# Patient Record
Sex: Female | Born: 1950 | Race: White | Hispanic: No | Marital: Married | Smoking: Former smoker
Health system: Southern US, Community
[De-identification: ages and names within clinical notes are randomized; demographics above are authoritative.]

## PROBLEM LIST (undated history)

## (undated) DIAGNOSIS — K5792 Diverticulitis of intestine, part unspecified, without perforation or abscess without bleeding: Secondary | ICD-10-CM

## (undated) DIAGNOSIS — E039 Hypothyroidism, unspecified: Secondary | ICD-10-CM

## (undated) DIAGNOSIS — G43909 Migraine, unspecified, not intractable, without status migrainosus: Secondary | ICD-10-CM

## (undated) DIAGNOSIS — F329 Major depressive disorder, single episode, unspecified: Secondary | ICD-10-CM

## (undated) DIAGNOSIS — I4719 Other supraventricular tachycardia: Secondary | ICD-10-CM

## (undated) DIAGNOSIS — J45909 Unspecified asthma, uncomplicated: Secondary | ICD-10-CM

## (undated) DIAGNOSIS — M4800 Spinal stenosis, site unspecified: Secondary | ICD-10-CM

## (undated) DIAGNOSIS — K219 Gastro-esophageal reflux disease without esophagitis: Secondary | ICD-10-CM

## (undated) DIAGNOSIS — H5213 Myopia, bilateral: Secondary | ICD-10-CM

## (undated) DIAGNOSIS — N200 Calculus of kidney: Secondary | ICD-10-CM

## (undated) DIAGNOSIS — I10 Essential (primary) hypertension: Secondary | ICD-10-CM

## (undated) DIAGNOSIS — H409 Unspecified glaucoma: Secondary | ICD-10-CM

## (undated) DIAGNOSIS — M1990 Unspecified osteoarthritis, unspecified site: Secondary | ICD-10-CM

## (undated) DIAGNOSIS — F32A Depression, unspecified: Secondary | ICD-10-CM

## (undated) HISTORY — DX: Unspecified asthma, uncomplicated: J45.909

## (undated) HISTORY — DX: Diverticulitis of intestine, part unspecified, without perforation or abscess without bleeding: K57.92

## (undated) HISTORY — DX: Unspecified osteoarthritis, unspecified site: M19.90

## (undated) HISTORY — DX: Unspecified glaucoma: H40.9

## (undated) HISTORY — DX: Myopia, bilateral: H52.13

## (undated) HISTORY — DX: Hypothyroidism, unspecified: E03.9

## (undated) HISTORY — DX: Major depressive disorder, single episode, unspecified: F32.9

## (undated) HISTORY — DX: Essential (primary) hypertension: I10

## (undated) HISTORY — DX: Gastro-esophageal reflux disease without esophagitis: K21.9

## (undated) HISTORY — DX: Migraine, unspecified, not intractable, without status migrainosus: G43.909

## (undated) HISTORY — DX: Calculus of kidney: N20.0

## (undated) HISTORY — DX: Depression, unspecified: F32.A

## (undated) HISTORY — PX: EYE SURGERY: SHX253

---

## 1978-02-07 HISTORY — PX: OTHER SURGICAL HISTORY: SHX169

## 2003-02-08 HISTORY — PX: OTHER SURGICAL HISTORY: SHX169

## 2004-01-31 ENCOUNTER — Other Ambulatory Visit: Payer: Self-pay

## 2004-02-01 ENCOUNTER — Inpatient Hospital Stay: Payer: Self-pay | Admitting: Podiatry

## 2004-02-08 HISTORY — PX: FOOT SURGERY: SHX648

## 2004-02-10 ENCOUNTER — Inpatient Hospital Stay: Payer: Self-pay | Admitting: Podiatry

## 2004-09-15 ENCOUNTER — Ambulatory Visit: Payer: Self-pay | Admitting: Internal Medicine

## 2005-05-28 ENCOUNTER — Ambulatory Visit: Payer: Self-pay

## 2005-10-12 ENCOUNTER — Other Ambulatory Visit: Payer: Self-pay

## 2005-10-12 ENCOUNTER — Inpatient Hospital Stay: Payer: Self-pay | Admitting: Internal Medicine

## 2005-11-10 ENCOUNTER — Ambulatory Visit: Payer: Self-pay | Admitting: Internal Medicine

## 2005-11-29 ENCOUNTER — Ambulatory Visit (HOSPITAL_BASED_OUTPATIENT_CLINIC_OR_DEPARTMENT_OTHER): Admission: RE | Admit: 2005-11-29 | Discharge: 2005-11-29 | Payer: Self-pay | Admitting: Orthopedic Surgery

## 2006-02-07 HISTORY — PX: SHOULDER ADHESION RELEASE: SHX773

## 2006-06-07 ENCOUNTER — Ambulatory Visit: Payer: Self-pay | Admitting: Internal Medicine

## 2006-09-29 ENCOUNTER — Ambulatory Visit: Payer: Self-pay | Admitting: Unknown Physician Specialty

## 2007-01-10 ENCOUNTER — Ambulatory Visit: Payer: Self-pay | Admitting: Internal Medicine

## 2008-01-15 ENCOUNTER — Ambulatory Visit: Payer: Self-pay | Admitting: Internal Medicine

## 2009-01-09 ENCOUNTER — Ambulatory Visit: Payer: Self-pay | Admitting: Ophthalmology

## 2009-02-10 ENCOUNTER — Ambulatory Visit: Payer: Self-pay | Admitting: Ophthalmology

## 2009-03-11 ENCOUNTER — Ambulatory Visit: Payer: Self-pay | Admitting: Ophthalmology

## 2009-04-22 ENCOUNTER — Ambulatory Visit: Payer: Self-pay | Admitting: Ophthalmology

## 2010-01-22 ENCOUNTER — Ambulatory Visit: Payer: Self-pay | Admitting: Internal Medicine

## 2010-04-16 ENCOUNTER — Ambulatory Visit: Payer: Self-pay | Admitting: Unknown Physician Specialty

## 2011-03-23 ENCOUNTER — Ambulatory Visit: Payer: Self-pay | Admitting: Internal Medicine

## 2011-12-05 ENCOUNTER — Encounter: Payer: Self-pay | Admitting: Internal Medicine

## 2011-12-05 ENCOUNTER — Ambulatory Visit (INDEPENDENT_AMBULATORY_CARE_PROVIDER_SITE_OTHER): Payer: PRIVATE HEALTH INSURANCE | Admitting: Internal Medicine

## 2011-12-05 VITALS — BP 108/69 | HR 62 | Temp 98.0°F | Ht 66.5 in | Wt 161.2 lb

## 2011-12-05 DIAGNOSIS — M79609 Pain in unspecified limb: Secondary | ICD-10-CM

## 2011-12-05 DIAGNOSIS — M25559 Pain in unspecified hip: Secondary | ICD-10-CM

## 2011-12-05 DIAGNOSIS — M79606 Pain in leg, unspecified: Secondary | ICD-10-CM

## 2011-12-05 DIAGNOSIS — K219 Gastro-esophageal reflux disease without esophagitis: Secondary | ICD-10-CM

## 2011-12-05 DIAGNOSIS — E039 Hypothyroidism, unspecified: Secondary | ICD-10-CM

## 2011-12-05 DIAGNOSIS — E7800 Pure hypercholesterolemia, unspecified: Secondary | ICD-10-CM

## 2011-12-05 NOTE — Patient Instructions (Signed)
It was nice seeing you today.  I am sorry you have been having trouble with your hip and leg.  I am going to refer you to see Dr Yves Dill for evaluation and treatment.  We will get you scheduled for your physical.

## 2011-12-06 ENCOUNTER — Encounter: Payer: Self-pay | Admitting: Internal Medicine

## 2011-12-06 DIAGNOSIS — E7800 Pure hypercholesterolemia, unspecified: Secondary | ICD-10-CM | POA: Insufficient documentation

## 2011-12-06 DIAGNOSIS — K219 Gastro-esophageal reflux disease without esophagitis: Secondary | ICD-10-CM | POA: Insufficient documentation

## 2011-12-06 DIAGNOSIS — E039 Hypothyroidism, unspecified: Secondary | ICD-10-CM | POA: Insufficient documentation

## 2011-12-06 NOTE — Assessment & Plan Note (Signed)
Low cholesterol diet.  Follow lipid panel.    

## 2011-12-06 NOTE — Progress Notes (Signed)
Subjective:    Patient ID: Sydney Anderson, female    DOB: 08/31/1950, 61 y.o.   MRN: [id number]  HPI 61 year old female with past history of hypothyroidism and GERD who comes in today for a scheduled follow up.  States she has been doing relatively well.  Her biggest complaint is that of increasing hip and leg pain (right).  Hard to sleep.  Aches during the day.  If she is sitting for a while, hard to initially stand and walk.  Once she starts walking, pain does improve some.  Unable to do her power walking secondary to increased pain.  She is able to walk regularly.  Breathing stable.  Still dealing with a lot of stress at home with her financial situation and her husbands issues.  She feels she is handling things relatively well.   Past Medical History  Diagnosis Date  . Asthma   . Arthritis   . Depression   . GERD (gastroesophageal reflux disease)   . Hypertension   . Diverticulitis   . Kidney stones   . Migraines   . Hypothyroidism     Review of Systems Patient denies any signficant headache, lightheadedness or dizziness.  No significant sinus or allergy issues.  No chest pain, tightness or palpatations.  No increased shortness of breath, cough or congestion.  Breathing stable.  No increased acid reflux.  Takes Prilosec and this controls.  No nausea or vomiting.  No abdominal pain or cramping.  No bowel change, such as diarrhea, constipation, BRBPR or melana.  No urine change.  Right leg and hip pain as outlined.       Objective:   Physical Exam Filed Vitals:   12/05/11 1554  BP: 108/69  Pulse: 62  Temp: 98 F (36.7 C)  .  Blood pressure recheck:  68/11  61 year old female in no acute distress.   HEENT:  Nares - clear.  OP- without lesions or erythema.  NECK:  Supple, nontender.  No audible carotid bruit.   HEART:  Appears to be regular. LUNGS:  Without crackles or wheezing audible.  Respirations even and unlabored.   RADIAL PULSE:  Equal bilaterally.  ABDOMEN:  Soft,  nontender.  No audible abdominal bruit.   EXTREMITIES:  No increased edema to be present.   MSK:  Some increased discomfort and pulling sensation in the right lateral hip and leg with straight leg raise (right).  No significant pain with straight leg raise (left).  Some increased discomfort in the right groin with rotation of the right leg at the hip.  Increased pain and a pulling sensation with resistance  - at the right hip.                     Assessment & Plan:  RIGHT HIP AND LEG PAIN.  Persistent and worsening pain/discomfort.  Has had xrays previously.  Has seen Dr Lavenia Atlas.  Tylenol and antiinflammatories do not help.  Unable to tolerate Ultram.  Discussed narcotic meds (such as Vicodin).  She declines.  States she has intolerance to these meds as well.  Is worsening.  Limiting her activity and affecting her sleep. Discussed options.  Will hold on repeat xray or scans at this time.  Pt in agreement.  Will refer to Dr Yves Dill for further evaluation and treatment.  Pt comfortable with this plan.    INCREASED PSYCHOSOCIAL STRESSORS.  On Prozac.  She feels she is handling things relatively well.  Follow.  PULMONARY.  Breathing is stable.  Follow.    HEALTH MAINTENANCE.  Schedule a physical for next visit.  She has desired to have a mammogram every other year.  Desires not to have annually.  Will obtain records for review.  Last mammogram 2012.  Declined for me to schedule today.  Up to date with her colonscopy.

## 2011-12-06 NOTE — Assessment & Plan Note (Signed)
Controlled on Prilosec.  Follow.   

## 2011-12-06 NOTE — Assessment & Plan Note (Signed)
On Synthroid.  Check tsh with next labs.

## 2011-12-22 ENCOUNTER — Other Ambulatory Visit: Payer: Self-pay | Admitting: Internal Medicine

## 2011-12-22 ENCOUNTER — Other Ambulatory Visit: Payer: Self-pay | Admitting: *Deleted

## 2011-12-22 MED ORDER — LEVOTHYROXINE SODIUM 50 MCG PO TABS: 50.0000 ug | ORAL_TABLET | Freq: Every day | ORAL | Status: DC

## 2011-12-22 NOTE — Telephone Encounter (Signed)
rx sent in to pharmacy for levothyroxine #30 with 6 refills.

## 2011-12-22 NOTE — Telephone Encounter (Signed)
Patient requesting refills for levothyroxine 50 mcg.

## 2011-12-22 NOTE — Telephone Encounter (Signed)
The fax for the prescription is on your desk

## 2012-02-29 ENCOUNTER — Telehealth: Payer: Self-pay | Admitting: Internal Medicine

## 2012-02-29 NOTE — Telephone Encounter (Signed)
Per Dr. Lorin Picket set up a CPE in April. I called and left message on 02/29/12 for her to call back and schedule. Per Dr. Lorin Picket get paper medical records ([employer]) and write on top when her appt is scheduled

## 2012-03-24 ENCOUNTER — Other Ambulatory Visit: Payer: Self-pay

## 2012-04-22 ENCOUNTER — Telehealth: Payer: Self-pay | Admitting: Internal Medicine

## 2012-04-22 MED ORDER — FLUOXETINE HCL 10 MG PO TABS: 10.0000 mg | ORAL_TABLET | Freq: Every day | ORAL | Status: DC

## 2012-04-22 NOTE — Telephone Encounter (Signed)
Refilled prozac (5 months)

## 2012-05-23 ENCOUNTER — Encounter: Payer: Self-pay | Admitting: Internal Medicine

## 2012-05-23 ENCOUNTER — Ambulatory Visit (INDEPENDENT_AMBULATORY_CARE_PROVIDER_SITE_OTHER): Payer: PRIVATE HEALTH INSURANCE | Admitting: Internal Medicine

## 2012-05-23 VITALS — BP 130/80 | HR 61 | Temp 98.7°F | Ht 66.5 in | Wt 165.5 lb

## 2012-05-23 DIAGNOSIS — K219 Gastro-esophageal reflux disease without esophagitis: Secondary | ICD-10-CM

## 2012-05-23 DIAGNOSIS — E7800 Pure hypercholesterolemia, unspecified: Secondary | ICD-10-CM

## 2012-05-23 DIAGNOSIS — E039 Hypothyroidism, unspecified: Secondary | ICD-10-CM

## 2012-05-23 LAB — CBC WITH DIFFERENTIAL/PLATELET
Basophils Relative: 0.6 % (ref 0.0–3.0)
Eosinophils Absolute: 0.1 10*3/uL (ref 0.0–0.7)
Eosinophils Relative: 2.7 % (ref 0.0–5.0)
Lymphocytes Relative: 33.4 % (ref 12.0–46.0)
MCHC: 33.2 g/dL (ref 30.0–36.0)
Neutrophils Relative %: 57.9 % (ref 43.0–77.0)
Platelets: 274 10*3/uL (ref 150.0–400.0)
RBC: 4.56 Mil/uL (ref 3.87–5.11)
WBC: 5.4 10*3/uL (ref 4.5–10.5)

## 2012-05-23 LAB — COMPREHENSIVE METABOLIC PANEL
AST: 21 U/L (ref 0–37)
Albumin: 4.3 g/dL (ref 3.5–5.2)
BUN: 16 mg/dL (ref 6–23)
Calcium: 9.1 mg/dL (ref 8.4–10.5)
Chloride: 106 mEq/L (ref 96–112)
Potassium: 4.4 mEq/L (ref 3.5–5.1)
Sodium: 140 mEq/L (ref 135–145)
Total Protein: 6.9 g/dL (ref 6.0–8.3)

## 2012-05-23 LAB — LIPID PANEL
Cholesterol: 181 mg/dL (ref 0–200)
LDL Cholesterol: 116 mg/dL — ABNORMAL HIGH (ref 0–99)
Total CHOL/HDL Ratio: 3

## 2012-05-23 NOTE — Progress Notes (Signed)
Subjective:    Patient ID: Sydney Anderson, female    DOB: 01-26-1951, 62 y.o.   MRN: [id number]  HPI 62 year old female with past history of hypothyroidism and GERD who comes in today to follow up on these issues as well as for a complete physical exam.  States she has been doing relatively well.  Her complaint is that of increasing hip and leg pain (right).  Saw Dr Yves Dill.  Had injection.  Helped.  Starting to wear off.  Unable to do her power walking secondary to increased pain.  She is able to walk regularly.   Breathing stable.  Still dealing with a lot of stress at home with her financial situation and her husbands issues.  She feels she is handling things relatively well.    Past Medical History  Diagnosis Date  . Asthma   . Arthritis   . Depression   . GERD (gastroesophageal reflux disease)   . Hypertension   . Diverticulitis   . Kidney stones   . Migraines   . Hypothyroidism     Current Outpatient Prescriptions on File Prior to Visit  Medication Sig Dispense Refill  . aspirin 81 MG tablet Take 81 mg by mouth daily.      . Cholecalciferol (D3-1000) 1000 UNITS capsule Take 1,000 Units by mouth daily.      Marland Kitchen FLUoxetine (PROZAC) 10 MG tablet Take 1 tablet (10 mg total) by mouth daily.  30 tablet  4  . levothyroxine (SYNTHROID, LEVOTHROID) 50 MCG tablet Take 1 tablet (50 mcg total) by mouth daily.  30 tablet  6  . Lutein 20 MG CAPS Take by mouth daily.      . Magnesium 250 MG TABS Take by mouth daily. As needed      . omeprazole (PRILOSEC) 20 MG capsule Take 20 mg by mouth daily.      . ranitidine (ZANTAC) 150 MG capsule Take 150 mg by mouth daily.      . sodium chloride (OCEAN) 0.65 % nasal spray Place 1 spray into the nose as needed.       No current facility-administered medications on file prior to visit.    Review of Systems Patient denies any signficant headache, lightheadedness or dizziness.  No significant sinus or allergy issues.  No chest pain, tightness or  palpatations.  No increased shortness of breath, cough or congestion.  Breathing stable.  No increased acid reflux.  Takes Prilosec and this controls.  No nausea or vomiting.  No abdominal pain or cramping.  No bowel change, such as diarrhea, constipation, BRBPR or melana.  No urine change.  Right leg and hip pain as outlined.       Objective:   Physical Exam  Filed Vitals:   05/23/12 0810  BP: 130/80  Pulse: 61  Temp: 98.7 F (37.1 C)  .  Blood pressure recheck:  84/37  63 year old female in no acute distress.   HEENT:  Nares- clear.  Oropharynx - without lesions. NECK:  Supple.  Nontender.  No audible bruit.  HEART:  Appears to be regular. LUNGS:  No crackles or wheezing audible.  Respirations even and unlabored.  RADIAL PULSE:  Equal bilaterally.    BREASTS:  No nipple discharge or nipple retraction present.  Could not appreciate any distinct nodules or axillary adenopathy.  ABDOMEN:  Soft, nontender.  Bowel sounds present and normal.  No audible abdominal bruit.  GU:  Normal external genitalia.  Vaginal vault without lesions.  S/p  hysterectomy.  Could not appreciate any adnexal masses or tenderness.   RECTAL:  Heme negative.   EXTREMITIES:  No increased edema present.  DP pulses palpable and equal bilaterally.              Assessment & Plan:  RIGHT HIP AND LEG PAIN.  Persistent pain.  Improved with injection - Dr Yves Dill.  Continues to follow up with Dr Yves Dill.    INCREASED PSYCHOSOCIAL STRESSORS.  On Prozac.  She feels she is handling things relatively well.  Follow.    PULMONARY.  Breathing is stable.  Follow.    HEALTH MAINTENANCE.  Physical today.  Last mammogram 2012.  Colonscopy 04/16/10.

## 2012-05-24 ENCOUNTER — Encounter: Payer: Self-pay | Admitting: Internal Medicine

## 2012-05-26 ENCOUNTER — Encounter: Payer: Self-pay | Admitting: Internal Medicine

## 2012-05-26 NOTE — Assessment & Plan Note (Signed)
Low cholesterol diet.  Follow lipid panel.    

## 2012-05-26 NOTE — Assessment & Plan Note (Signed)
On Synthroid.  Follow tsh.    

## 2012-05-26 NOTE — Assessment & Plan Note (Signed)
Controlled on Prilosec.  Follow.   

## 2012-06-19 ENCOUNTER — Encounter: Payer: Self-pay | Admitting: Internal Medicine

## 2012-07-17 ENCOUNTER — Other Ambulatory Visit: Payer: Self-pay | Admitting: *Deleted

## 2012-07-17 MED ORDER — LEVOTHYROXINE SODIUM 50 MCG PO TABS: 50.0000 ug | ORAL_TABLET | Freq: Every day | ORAL | Status: DC

## 2012-10-02 ENCOUNTER — Ambulatory Visit: Payer: Self-pay | Admitting: Internal Medicine

## 2012-10-16 ENCOUNTER — Encounter: Payer: Self-pay | Admitting: Internal Medicine

## 2012-10-17 ENCOUNTER — Other Ambulatory Visit: Payer: Self-pay | Admitting: *Deleted

## 2012-10-17 MED ORDER — FLUOXETINE HCL 10 MG PO TABS: 10.0000 mg | ORAL_TABLET | Freq: Every day | ORAL | Status: DC

## 2012-11-07 ENCOUNTER — Ambulatory Visit: Payer: Self-pay | Admitting: General Practice

## 2012-11-07 LAB — BASIC METABOLIC PANEL
Anion Gap: 3 — ABNORMAL LOW (ref 7–16)
BUN: 14 mg/dL (ref 7–18)
Calcium, Total: 8.9 mg/dL (ref 8.5–10.1)
Creatinine: 0.68 mg/dL (ref 0.60–1.30)
EGFR (African American): >60
EGFR (Non-African Amer.): >60
Osmolality: 278 (ref 275–301)
Potassium: 4.4 mmol/L (ref 3.5–5.1)
Sodium: 139 mmol/L (ref 136–145)

## 2012-11-07 LAB — URINALYSIS, COMPLETE
Bacteria: NEGATIVE
Bilirubin,UR: NEGATIVE
Blood: NEGATIVE
Glucose,UR: NEGATIVE mg/dL (ref 0–75)
Ketone: NEGATIVE
Nitrite: NEGATIVE
Ph: 7 (ref 4.5–8.0)
RBC,UR: NONE SEEN /HPF (ref 0–5)
Specific Gravity: 1.005 (ref 1.003–1.030)
WBC UR: NONE SEEN /HPF (ref 0–5)

## 2012-11-07 LAB — CBC
HCT: 37.9 % (ref 35.0–47.0)
MCHC: 34.1 g/dL (ref 32.0–36.0)
MCV: 84 fL (ref 80–100)
RDW: 13.6 % (ref 11.5–14.5)
WBC: 5.9 10*3/uL (ref 3.6–11.0)

## 2012-11-07 LAB — PROTIME-INR: INR: 1

## 2012-11-07 LAB — MRSA PCR SCREENING

## 2012-11-08 LAB — URINE CULTURE

## 2012-11-19 ENCOUNTER — Inpatient Hospital Stay: Payer: Self-pay | Admitting: General Practice

## 2012-11-20 LAB — BASIC METABOLIC PANEL
Anion Gap: 4 — ABNORMAL LOW (ref 7–16)
BUN: 6 mg/dL — ABNORMAL LOW (ref 7–18)
Calcium, Total: 8 mg/dL — ABNORMAL LOW (ref 8.5–10.1)
Chloride: 108 mmol/L — ABNORMAL HIGH (ref 98–107)
Co2: 28 mmol/L (ref 21–32)
EGFR (African American): >60
EGFR (Non-African Amer.): >60
Glucose: 107 mg/dL — ABNORMAL HIGH (ref 65–99)

## 2012-11-20 LAB — PLATELET COUNT: Platelet: 220 10*3/uL (ref 150–440)

## 2012-11-20 LAB — HEMOGLOBIN: HGB: 10.1 g/dL — ABNORMAL LOW (ref 12.0–16.0)

## 2012-11-21 LAB — PLATELET COUNT: Platelet: 177 10*3/uL (ref 150–440)

## 2012-11-21 LAB — BASIC METABOLIC PANEL
BUN: 7 mg/dL (ref 7–18)
Calcium, Total: 7.8 mg/dL — ABNORMAL LOW (ref 8.5–10.1)
Chloride: 108 mmol/L — ABNORMAL HIGH (ref 98–107)
Creatinine: 0.59 mg/dL — ABNORMAL LOW (ref 0.60–1.30)
EGFR (African American): >60
Glucose: 109 mg/dL — ABNORMAL HIGH (ref 65–99)
Potassium: 3.4 mmol/L — ABNORMAL LOW (ref 3.5–5.1)
Sodium: 139 mmol/L (ref 136–145)

## 2012-11-21 LAB — PATHOLOGY REPORT

## 2012-11-22 ENCOUNTER — Ambulatory Visit: Payer: PRIVATE HEALTH INSURANCE | Admitting: Internal Medicine

## 2012-12-13 ENCOUNTER — Other Ambulatory Visit: Payer: Self-pay

## 2012-12-31 ENCOUNTER — Encounter: Payer: Self-pay | Admitting: Internal Medicine

## 2012-12-31 ENCOUNTER — Ambulatory Visit (INDEPENDENT_AMBULATORY_CARE_PROVIDER_SITE_OTHER): Payer: PRIVATE HEALTH INSURANCE | Admitting: Internal Medicine

## 2012-12-31 VITALS — BP 110/70 | HR 64 | Temp 98.2°F | Ht 66.5 in | Wt 162.2 lb

## 2012-12-31 DIAGNOSIS — E7800 Pure hypercholesterolemia, unspecified: Secondary | ICD-10-CM

## 2012-12-31 DIAGNOSIS — M25551 Pain in right hip: Secondary | ICD-10-CM

## 2012-12-31 DIAGNOSIS — M25559 Pain in unspecified hip: Secondary | ICD-10-CM

## 2012-12-31 DIAGNOSIS — E039 Hypothyroidism, unspecified: Secondary | ICD-10-CM

## 2012-12-31 DIAGNOSIS — F439 Reaction to severe stress, unspecified: Secondary | ICD-10-CM

## 2012-12-31 DIAGNOSIS — K219 Gastro-esophageal reflux disease without esophagitis: Secondary | ICD-10-CM

## 2012-12-31 DIAGNOSIS — Z733 Stress, not elsewhere classified: Secondary | ICD-10-CM

## 2012-12-31 NOTE — Progress Notes (Signed)
Subjective:    Patient ID: Sydney Anderson, female    DOB: December 06, 1950, 62 y.o.   MRN: [id number]  HPI 62 year old female with past history of hypothyroidism and GERD who comes in today for a scheduled follow up.   States she has been doing relatively well.  She is s/p right hip surgery 10/14.  Did well with the surgery.  Has f/u with Dr Ernest Pine tomorrow.  Takes oxycodone prn.  Decreased pain.  Feels better.  Breathing stable.  Still dealing with a lot of stress at home with her financial situation and her husbands issues.  She feels she is handling things relatively well - doing better with this.  Blood pressure has been doing well.    Past Medical History  Diagnosis Date  . Asthma   . Arthritis   . Depression   . GERD (gastroesophageal reflux disease)   . Hypertension   . Diverticulitis   . Kidney stones   . Migraines   . Hypothyroidism     Current Outpatient Prescriptions on File Prior to Visit  Medication Sig Dispense Refill  . aspirin 81 MG tablet Take 81 mg by mouth daily.      . Cholecalciferol (D3-1000) 1000 UNITS capsule Take 1,000 Units by mouth daily.      Marland Kitchen FLUoxetine (PROZAC) 10 MG tablet Take 1 tablet (10 mg total) by mouth daily.  30 tablet  2  . levothyroxine (SYNTHROID, LEVOTHROID) 50 MCG tablet Take 1 tablet (50 mcg total) by mouth daily.  30 tablet  10  . Lutein 20 MG CAPS Take by mouth daily.      . Magnesium 250 MG TABS Take by mouth daily. As needed      . methylcellulose (ARTIFICIAL TEARS) 1 % ophthalmic solution Place 1 drop into both eyes as needed.      . Naproxen Sodium (ALEVE PO) Take 1-2 tablets by mouth daily.      Marland Kitchen omeprazole (PRILOSEC) 20 MG capsule Take 20 mg by mouth daily.      . ranitidine (ZANTAC) 150 MG capsule Take 150 mg by mouth daily.      . sodium chloride (OCEAN) 0.65 % nasal spray Place 1 spray into the nose as needed.       No current facility-administered medications on file prior to visit.    Review of Systems Patient did not report  headache, lightheadedness or dizziness.  No significant sinus or allergy issues.  No chest pain, tightness or palpitations.  No increased shortness of breath, cough or congestion.  Breathing stable.  No increased acid reflux.  Takes Prilosec and this controls.  No nausea or vomiting.  No abdominal pain or cramping.  No bowel change, such as diarrhea, constipation, BRBPR or melana.  No urine change.  Right leg and hip pain have improved.  S/p surgery. Blood pressure has been doing well.        Objective:   Physical Exam  Filed Vitals:   12/31/12 1518  BP: 110/70  Pulse: 64  Temp: 98.2 F (36.8 C)  .  Blood pressure recheck:  122/80, pulse 63  62 year old female in no acute distress.   HEENT:  Nares- clear.  Oropharynx - without lesions. NECK:  Supple.  Nontender.  No audible bruit.  HEART:  Appears to be regular. LUNGS:  No crackles or wheezing audible.  Respirations even and unlabored.  RADIAL PULSE:  Equal bilaterally.  ABDOMEN:  Soft, nontender.  Bowel sounds present and normal.  No audible abdominal bruit.    EXTREMITIES:  No increased edema present.  DP pulses palpable and equal bilaterally.              Assessment & Plan:  PULMONARY.  Breathing is stable.  Follow.    HEALTH MAINTENANCE.  Physical last visit.  Last mammogram 2012.  Colonscopy 04/16/10.

## 2012-12-31 NOTE — Progress Notes (Signed)
Pre-visit discussion using our clinic review tool. No additional management support is needed unless otherwise documented below in the visit note.  

## 2013-01-05 ENCOUNTER — Encounter: Payer: Self-pay | Admitting: Internal Medicine

## 2013-01-05 DIAGNOSIS — F439 Reaction to severe stress, unspecified: Secondary | ICD-10-CM | POA: Insufficient documentation

## 2013-01-05 DIAGNOSIS — M25551 Pain in right hip: Secondary | ICD-10-CM | POA: Insufficient documentation

## 2013-01-05 NOTE — Assessment & Plan Note (Signed)
On Synthroid.  Follow tsh.    

## 2013-01-05 NOTE — Assessment & Plan Note (Signed)
Low cholesterol diet.  Follow lipid panel.    

## 2013-01-05 NOTE — Assessment & Plan Note (Signed)
Controlled on Prilosec.  Follow.   

## 2013-01-05 NOTE — Assessment & Plan Note (Signed)
On prozac.  Appears to be doing well.  Follow.  

## 2013-01-05 NOTE — Assessment & Plan Note (Signed)
S/p hip surgery.  Has done well.  Due to f/u with Dr Ernest Pine tomorrow.  Pain better.

## 2013-02-07 DIAGNOSIS — M545 Low back pain, unspecified: Secondary | ICD-10-CM | POA: Insufficient documentation

## 2013-02-07 DIAGNOSIS — G8929 Other chronic pain: Secondary | ICD-10-CM | POA: Insufficient documentation

## 2013-03-27 ENCOUNTER — Other Ambulatory Visit: Payer: Self-pay | Admitting: *Deleted

## 2013-03-27 MED ORDER — FLUOXETINE HCL 10 MG PO TABS: 10.0000 mg | ORAL_TABLET | Freq: Every day | ORAL | Status: DC

## 2013-05-28 ENCOUNTER — Encounter: Payer: PRIVATE HEALTH INSURANCE | Admitting: Internal Medicine

## 2013-06-03 ENCOUNTER — Ambulatory Visit (INDEPENDENT_AMBULATORY_CARE_PROVIDER_SITE_OTHER): Payer: PRIVATE HEALTH INSURANCE | Admitting: Internal Medicine

## 2013-06-03 ENCOUNTER — Encounter: Payer: Self-pay | Admitting: Internal Medicine

## 2013-06-03 VITALS — BP 110/70 | HR 56 | Temp 97.8°F | Ht 66.25 in | Wt 167.0 lb

## 2013-06-03 DIAGNOSIS — F439 Reaction to severe stress, unspecified: Secondary | ICD-10-CM

## 2013-06-03 DIAGNOSIS — R5381 Other malaise: Secondary | ICD-10-CM

## 2013-06-03 DIAGNOSIS — Z733 Stress, not elsewhere classified: Secondary | ICD-10-CM

## 2013-06-03 DIAGNOSIS — M25559 Pain in unspecified hip: Secondary | ICD-10-CM

## 2013-06-03 DIAGNOSIS — E039 Hypothyroidism, unspecified: Secondary | ICD-10-CM

## 2013-06-03 DIAGNOSIS — M25551 Pain in right hip: Secondary | ICD-10-CM

## 2013-06-03 DIAGNOSIS — R5383 Other fatigue: Secondary | ICD-10-CM

## 2013-06-03 DIAGNOSIS — K219 Gastro-esophageal reflux disease without esophagitis: Secondary | ICD-10-CM

## 2013-06-03 DIAGNOSIS — E7800 Pure hypercholesterolemia, unspecified: Secondary | ICD-10-CM

## 2013-06-03 LAB — CBC WITH DIFFERENTIAL/PLATELET
BASOS ABS: 0.1 10*3/uL (ref 0.0–0.1)
Basophils Relative: 0.9 % (ref 0.0–3.0)
EOS ABS: 0.2 10*3/uL (ref 0.0–0.7)
Eosinophils Relative: 2.4 % (ref 0.0–5.0)
HCT: 36.7 % (ref 36.0–46.0)
Hemoglobin: 11.8 g/dL — ABNORMAL LOW (ref 12.0–15.0)
LYMPHS PCT: 33.5 % (ref 12.0–46.0)
Lymphs Abs: 2.2 10*3/uL (ref 0.7–4.0)
MCHC: 32.2 g/dL (ref 30.0–36.0)
MCV: 74.8 fl — AB (ref 78.0–100.0)
MONO ABS: 0.4 10*3/uL (ref 0.1–1.0)
Monocytes Relative: 6.1 % (ref 3.0–12.0)
NEUTROS PCT: 57.1 % (ref 43.0–77.0)
Neutro Abs: 3.7 10*3/uL (ref 1.4–7.7)
PLATELETS: 334 10*3/uL (ref 150.0–400.0)
RBC: 4.9 Mil/uL (ref 3.87–5.11)
RDW: 16.8 % — AB (ref 11.5–14.6)
WBC: 6.5 10*3/uL (ref 4.5–10.5)

## 2013-06-03 LAB — COMPREHENSIVE METABOLIC PANEL
ALBUMIN: 4.3 g/dL (ref 3.5–5.2)
ALT: 12 U/L (ref 0–35)
AST: 19 U/L (ref 0–37)
Alkaline Phosphatase: 85 U/L (ref 39–117)
BUN: 14 mg/dL (ref 6–23)
CO2: 28 mEq/L (ref 19–32)
Calcium: 9.4 mg/dL (ref 8.4–10.5)
Chloride: 107 mEq/L (ref 96–112)
Creatinine, Ser: 0.7 mg/dL (ref 0.4–1.2)
GFR: 91.87 mL/min (ref >60.00)
Glucose, Bld: 91 mg/dL (ref 70–99)
Potassium: 4.6 mEq/L (ref 3.5–5.1)
Sodium: 142 mEq/L (ref 135–145)
Total Bilirubin: 0.6 mg/dL (ref 0.3–1.2)
Total Protein: 7.2 g/dL (ref 6.0–8.3)

## 2013-06-03 LAB — LIPID PANEL
CHOL/HDL RATIO: 3
CHOLESTEROL: 194 mg/dL (ref 0–200)
HDL: 58.3 mg/dL (ref >39.00)
LDL CALC: 121 mg/dL — AB (ref 0–99)
Triglycerides: 74 mg/dL (ref 0.0–149.0)
VLDL: 14.8 mg/dL (ref 0.0–40.0)

## 2013-06-03 LAB — TSH: TSH: 0.97 u[IU]/mL (ref 0.35–5.50)

## 2013-06-03 NOTE — Progress Notes (Signed)
Pre visit review using our clinic review tool, if applicable. No additional management support is needed unless otherwise documented below in the visit note. 

## 2013-06-03 NOTE — Progress Notes (Signed)
Subjective:    Patient ID: Sydney Anderson, female    DOB: 28-Apr-1950, [age] y.o.   MRN: [id number]  HPI 63 year old female with past history of hypothyroidism and GERD who comes in today to follow up on these issues as well as for a complete physical exam.   States she has been doing relatively well.  She is s/p right hip surgery 10/14.  Did well with the surgery.  Seeing Dr Ernest PineHooten.   Hip is doing ok.  She does have decreased endurance.  Increased stiffness and mid thigh pain.  Increased fatigue.  Breathing stable.  Still dealing with a lot of stress at home with her financial situation and her husbands issues.  She feels she is handling things relatively well - doing better with this.  Blood pressure has been doing well.     Past Medical History  Diagnosis Date  . Asthma   . Arthritis   . Depression   . GERD (gastroesophageal reflux disease)   . Hypertension   . Diverticulitis   . Kidney stones   . Migraines   . Hypothyroidism     Current Outpatient Prescriptions on File Prior to Visit  Medication Sig Dispense Refill  . aspirin 81 MG tablet Take 81 mg by mouth daily.      Marland Kitchen. FLUoxetine (PROZAC) 10 MG tablet Take 1 tablet (10 mg total) by mouth daily.  30 tablet  2  . levothyroxine (SYNTHROID, LEVOTHROID) 50 MCG tablet Take 1 tablet (50 mcg total) by mouth daily.  30 tablet  10  . Lutein 20 MG CAPS Take by mouth daily.      . Magnesium 250 MG TABS Take by mouth daily. As needed      . methylcellulose (ARTIFICIAL TEARS) 1 % ophthalmic solution Place 1 drop into both eyes as needed.      Marland Kitchen. omeprazole (PRILOSEC) 20 MG capsule Take 20 mg by mouth daily.      . ranitidine (ZANTAC) 150 MG capsule Take 150 mg by mouth daily.      . sodium chloride (OCEAN) 0.65 % nasal spray Place 1 spray into the nose as needed.      . timolol (BETIMOL) 0.5 % ophthalmic solution Place 1 drop into the right eye daily.       No current facility-administered medications on file prior to visit.    Review of  Systems Patient did not report headache, lightheadedness or dizziness.  No significant sinus or allergy issues.  No chest pain, tightness or palpitations.  No increased shortness of breath, cough or congestion.  Breathing stable.  No increased acid reflux.  Takes Prilosec and this controls.  No nausea or vomiting.  No abdominal pain or cramping.  No bowel change, such as diarrhea, constipation, BRBPR or melana.  No urine change.   S/p surgery. Blood pressure has been doing well.  Decreased endurance.  Increased stiffness.  Some fatigue.         Objective:   Physical Exam  Filed Vitals:   06/03/13 0829  BP: 110/70  Pulse: 56  Temp: 97.8 F (36.6 C)  .  Blood pressure recheck:  128/82, pulse 5664  63 year old female in no acute distress.   HEENT:  Nares- clear.  Oropharynx - without lesions. NECK:  Supple.  Nontender.  No audible bruit.  HEART:  Appears to be regular. LUNGS:  No crackles or wheezing audible.  Respirations even and unlabored.  RADIAL PULSE:  Equal bilaterally.  BREASTS:  No nipple discharge or nipple retraction present.  Could not appreciate any distinct nodules or axillary adenopathy.  ABDOMEN:  Soft, nontender.  Bowel sounds present and normal.  No audible abdominal bruit.    EXTREMITIES:  No increased edema present.  DP pulses palpable and equal bilaterally.             Assessment & Plan:  PULMONARY.  Breathing is stable.  Follow.    HEALTH MAINTENANCE.  Physical today.  Mammogram 10/02/12 - Birads I.  Colonscopy 04/16/10.   I spent 25 minutes with the patient and more than 50% of the time was spent in consultation regarding the above.

## 2013-06-04 ENCOUNTER — Encounter: Payer: Self-pay | Admitting: Internal Medicine

## 2013-06-04 ENCOUNTER — Telehealth: Payer: Self-pay | Admitting: Internal Medicine

## 2013-06-04 DIAGNOSIS — R5383 Other fatigue: Secondary | ICD-10-CM | POA: Insufficient documentation

## 2013-06-04 DIAGNOSIS — D649 Anemia, unspecified: Secondary | ICD-10-CM

## 2013-06-04 NOTE — Assessment & Plan Note (Signed)
Low cholesterol diet.  Follow lipid panel.    

## 2013-06-04 NOTE — Assessment & Plan Note (Signed)
On prozac.  Appears to be doing well.  Follow.  

## 2013-06-04 NOTE — Telephone Encounter (Signed)
Pt notified of lab results via my chart.  She needs a non fasting lab within the next 1-2 weeks.  Please schedule and contact her with a lab appt date and time.  Thanks.    Dr Lorin PicketScott

## 2013-06-04 NOTE — Assessment & Plan Note (Signed)
S/p hip surgery.  Has done well.  Following with Dr Ernest PineHooten.

## 2013-06-04 NOTE — Assessment & Plan Note (Signed)
Decreased endurance.  No chest pain or tightness.  No sob.  Check cbc, met c and tsh.

## 2013-06-04 NOTE — Assessment & Plan Note (Signed)
On Synthroid.  Follow tsh.    

## 2013-06-04 NOTE — Assessment & Plan Note (Signed)
Controlled on Prilosec.  Follow.   

## 2013-06-06 NOTE — Telephone Encounter (Signed)
Left message for pt to call office

## 2013-06-11 ENCOUNTER — Other Ambulatory Visit (INDEPENDENT_AMBULATORY_CARE_PROVIDER_SITE_OTHER): Payer: PRIVATE HEALTH INSURANCE

## 2013-06-11 DIAGNOSIS — D649 Anemia, unspecified: Secondary | ICD-10-CM

## 2013-06-12 LAB — IBC PANEL
Iron: 33 ug/dL — ABNORMAL LOW (ref 42–145)
Saturation Ratios: 6.8 % — ABNORMAL LOW (ref 20.0–50.0)
TRANSFERRIN: 346.5 mg/dL (ref 212.0–360.0)

## 2013-06-12 LAB — CBC WITH DIFFERENTIAL/PLATELET
BASOS ABS: 0.1 10*3/uL (ref 0.0–0.1)
BASOS PCT: 1.4 % (ref 0.0–3.0)
EOS ABS: 0.2 10*3/uL (ref 0.0–0.7)
Eosinophils Relative: 2.4 % (ref 0.0–5.0)
HCT: 33.7 % — ABNORMAL LOW (ref 36.0–46.0)
Hemoglobin: 11.2 g/dL — ABNORMAL LOW (ref 12.0–15.0)
LYMPHS PCT: 39.5 % (ref 12.0–46.0)
Lymphs Abs: 2.8 10*3/uL (ref 0.7–4.0)
MCHC: 33.2 g/dL (ref 30.0–36.0)
MCV: 74.1 fl — AB (ref 78.0–100.0)
MONO ABS: 0.4 10*3/uL (ref 0.1–1.0)
Monocytes Relative: 5.7 % (ref 3.0–12.0)
NEUTROS PCT: 51 % (ref 43.0–77.0)
Neutro Abs: 3.7 10*3/uL (ref 1.4–7.7)
Platelets: 309 10*3/uL (ref 150.0–400.0)
RBC: 4.55 Mil/uL (ref 3.87–5.11)
RDW: 16.7 % — AB (ref 11.5–15.5)
WBC: 7.2 10*3/uL (ref 4.0–10.5)

## 2013-06-12 LAB — FERRITIN: Ferritin: 3.6 ng/mL — ABNORMAL LOW (ref 10.0–291.0)

## 2013-06-12 LAB — VITAMIN B12: Vitamin B-12: 500 pg/mL (ref 211–911)

## 2013-06-14 ENCOUNTER — Encounter: Payer: Self-pay | Admitting: *Deleted

## 2013-06-14 ENCOUNTER — Other Ambulatory Visit: Payer: Self-pay | Admitting: *Deleted

## 2013-06-14 DIAGNOSIS — D509 Iron deficiency anemia, unspecified: Secondary | ICD-10-CM

## 2013-06-18 NOTE — Telephone Encounter (Signed)
Order placed for referral.  

## 2013-07-02 ENCOUNTER — Other Ambulatory Visit: Payer: Self-pay | Admitting: *Deleted

## 2013-07-02 MED ORDER — LEVOTHYROXINE SODIUM 50 MCG PO TABS: 50.0000 ug | ORAL_TABLET | Freq: Every day | ORAL | Status: DC

## 2013-07-04 ENCOUNTER — Telehealth: Payer: Self-pay | Admitting: *Deleted

## 2013-07-04 DIAGNOSIS — D649 Anemia, unspecified: Secondary | ICD-10-CM

## 2013-07-04 NOTE — Telephone Encounter (Signed)
What labs and dx?  

## 2013-07-05 ENCOUNTER — Other Ambulatory Visit: Payer: PRIVATE HEALTH INSURANCE

## 2013-07-05 ENCOUNTER — Other Ambulatory Visit (INDEPENDENT_AMBULATORY_CARE_PROVIDER_SITE_OTHER): Payer: PRIVATE HEALTH INSURANCE

## 2013-07-05 DIAGNOSIS — D649 Anemia, unspecified: Secondary | ICD-10-CM

## 2013-07-05 LAB — CBC WITH DIFFERENTIAL/PLATELET
BASOS ABS: 0.1 10*3/uL (ref 0.0–0.1)
BASOS PCT: 1 % (ref 0–1)
EOS ABS: 0.1 10*3/uL (ref 0.0–0.7)
Eosinophils Relative: 2 % (ref 0–5)
HEMATOCRIT: 35.1 % — AB (ref 36.0–46.0)
Hemoglobin: 11.5 g/dL — ABNORMAL LOW (ref 12.0–15.0)
Lymphocytes Relative: 38 % (ref 12–46)
Lymphs Abs: 2.5 10*3/uL (ref 0.7–4.0)
MCH: 24.5 pg — ABNORMAL LOW (ref 26.0–34.0)
MCHC: 32.8 g/dL (ref 30.0–36.0)
MCV: 74.7 fL — AB (ref 78.0–100.0)
MONO ABS: 0.3 10*3/uL (ref 0.1–1.0)
Monocytes Relative: 5 % (ref 3–12)
Neutro Abs: 3.6 10*3/uL (ref 1.7–7.7)
Neutrophils Relative %: 54 % (ref 43–77)
PLATELETS: 327 10*3/uL (ref 150–400)
RBC: 4.7 MIL/uL (ref 3.87–5.11)
RDW: 18.6 % — AB (ref 11.5–15.5)
WBC: 6.6 10*3/uL (ref 4.0–10.5)

## 2013-07-05 NOTE — Telephone Encounter (Signed)
Orders placed for cbc and ferritin.  Thanks.

## 2013-07-06 LAB — FERRITIN: Ferritin: 9 ng/mL — ABNORMAL LOW (ref 10–291)

## 2013-07-08 ENCOUNTER — Telehealth: Payer: Self-pay | Admitting: Internal Medicine

## 2013-07-08 ENCOUNTER — Encounter: Payer: Self-pay | Admitting: Internal Medicine

## 2013-07-08 DIAGNOSIS — D649 Anemia, unspecified: Secondary | ICD-10-CM

## 2013-07-08 NOTE — Telephone Encounter (Signed)
Appt made for 7/1 at 0915. Message left for patient to call the office/msn

## 2013-07-08 NOTE — Telephone Encounter (Signed)
Pt notified of lab results via my chart.  Will need a non fasting lab appt in one month.  Please schedule and contact her with an appt date and time.  Thanks.    Dr Lorin Picket

## 2013-08-07 ENCOUNTER — Other Ambulatory Visit: Payer: PRIVATE HEALTH INSURANCE

## 2013-08-07 DIAGNOSIS — D649 Anemia, unspecified: Secondary | ICD-10-CM

## 2013-08-14 ENCOUNTER — Other Ambulatory Visit: Payer: Self-pay | Admitting: *Deleted

## 2013-08-14 ENCOUNTER — Encounter: Payer: Self-pay | Admitting: Internal Medicine

## 2013-08-14 MED ORDER — FLUOXETINE HCL 10 MG PO TABS: 10.0000 mg | ORAL_TABLET | Freq: Every day | ORAL | Status: DC

## 2013-08-21 ENCOUNTER — Telehealth: Payer: Self-pay | Admitting: *Deleted

## 2013-08-21 DIAGNOSIS — E7800 Pure hypercholesterolemia, unspecified: Secondary | ICD-10-CM

## 2013-08-21 DIAGNOSIS — D649 Anemia, unspecified: Secondary | ICD-10-CM

## 2013-08-21 NOTE — Telephone Encounter (Signed)
Order placed for labs.

## 2013-08-21 NOTE — Telephone Encounter (Signed)
Pt is coming in tomorrow what labs and dx?  

## 2013-08-22 ENCOUNTER — Other Ambulatory Visit (INDEPENDENT_AMBULATORY_CARE_PROVIDER_SITE_OTHER): Payer: PRIVATE HEALTH INSURANCE

## 2013-08-22 DIAGNOSIS — D649 Anemia, unspecified: Secondary | ICD-10-CM

## 2013-08-22 DIAGNOSIS — E7800 Pure hypercholesterolemia, unspecified: Secondary | ICD-10-CM

## 2013-08-23 LAB — CBC WITH DIFFERENTIAL/PLATELET
BASOS PCT: 1.7 % (ref 0.0–3.0)
Basophils Absolute: 0.1 10*3/uL (ref 0.0–0.1)
EOS PCT: 2.3 % (ref 0.0–5.0)
Eosinophils Absolute: 0.2 10*3/uL (ref 0.0–0.7)
HEMATOCRIT: 39.3 % (ref 36.0–46.0)
Hemoglobin: 13 g/dL (ref 12.0–15.0)
Lymphocytes Relative: 34.9 % (ref 12.0–46.0)
Lymphs Abs: 2.6 10*3/uL (ref 0.7–4.0)
MCHC: 33 g/dL (ref 30.0–36.0)
MCV: 82.1 fl (ref 78.0–100.0)
MONOS PCT: 5.6 % (ref 3.0–12.0)
Monocytes Absolute: 0.4 10*3/uL (ref 0.1–1.0)
NEUTROS PCT: 55.5 % (ref 43.0–77.0)
Neutro Abs: 4.1 10*3/uL (ref 1.4–7.7)
PLATELETS: 266 10*3/uL (ref 150.0–400.0)
RBC: 4.79 Mil/uL (ref 3.87–5.11)
RDW: 20.7 % — ABNORMAL HIGH (ref 11.5–15.5)
WBC: 7.4 10*3/uL (ref 4.0–10.5)

## 2013-08-23 LAB — COMPREHENSIVE METABOLIC PANEL
ALBUMIN: 4.1 g/dL (ref 3.5–5.2)
ALT: 14 U/L (ref 0–35)
AST: 17 U/L (ref 0–37)
Alkaline Phosphatase: 89 U/L (ref 39–117)
BUN: 15 mg/dL (ref 6–23)
CALCIUM: 9.5 mg/dL (ref 8.4–10.5)
CHLORIDE: 104 meq/L (ref 96–112)
CO2: 27 meq/L (ref 19–32)
Creatinine, Ser: 0.7 mg/dL (ref 0.4–1.2)
GFR: 96.64 mL/min (ref >60.00)
GLUCOSE: 81 mg/dL (ref 70–99)
POTASSIUM: 4.8 meq/L (ref 3.5–5.1)
SODIUM: 138 meq/L (ref 135–145)
Total Bilirubin: 0.5 mg/dL (ref 0.2–1.2)
Total Protein: 6.4 g/dL (ref 6.0–8.3)

## 2013-08-23 LAB — LIPID PANEL
Cholesterol: 190 mg/dL (ref 0–200)
HDL: 54.9 mg/dL (ref >39.00)
LDL Cholesterol: 116 mg/dL — ABNORMAL HIGH (ref 0–99)
NONHDL: 135.1
Total CHOL/HDL Ratio: 3
Triglycerides: 97 mg/dL (ref 0.0–149.0)
VLDL: 19.4 mg/dL (ref 0.0–40.0)

## 2013-08-23 LAB — FERRITIN: FERRITIN: 10.8 ng/mL (ref 10.0–291.0)

## 2013-08-26 ENCOUNTER — Ambulatory Visit: Payer: Self-pay | Admitting: Unknown Physician Specialty

## 2013-08-26 ENCOUNTER — Encounter: Payer: Self-pay | Admitting: Internal Medicine

## 2013-08-26 ENCOUNTER — Other Ambulatory Visit: Payer: Self-pay | Admitting: Internal Medicine

## 2013-08-28 LAB — PATHOLOGY REPORT

## 2013-09-02 ENCOUNTER — Telehealth: Payer: Self-pay | Admitting: Internal Medicine

## 2013-09-02 NOTE — Telephone Encounter (Signed)
I can see her 09/05/13 at 11:30.  Work in.  See if ok waiting until Thursday.

## 2013-09-02 NOTE — Telephone Encounter (Signed)
Please advise 

## 2013-09-02 NOTE — Telephone Encounter (Signed)
Pt request to be seen for the following health concern 1.Swollen heel on good foot 2. Lump on back of my tongue found during endoscopy 3. What step to take now about iron deficieny. They found nothing in endo/colonoscopy but he said insurance won't pay for anything else due to stool samples coming clean. Do I still continue iron, etc.?   The first appt given to pt in August she is unable to make. The next appt that I have scheduled her for is 10/8. Please advise where to add pt for a sooner visit.msn

## 2013-09-04 ENCOUNTER — Encounter: Payer: Self-pay | Admitting: Internal Medicine

## 2013-09-04 ENCOUNTER — Telehealth: Payer: Self-pay | Admitting: Internal Medicine

## 2013-09-04 DIAGNOSIS — K219 Gastro-esophageal reflux disease without esophagitis: Secondary | ICD-10-CM

## 2013-09-04 NOTE — Telephone Encounter (Signed)
Received EGD.  Mention of tongue lesion.  See report.  Send my chart message for pt to let me know if f/u for this has been arranged.

## 2013-09-05 ENCOUNTER — Encounter: Payer: Self-pay | Admitting: Internal Medicine

## 2013-09-05 ENCOUNTER — Ambulatory Visit (INDEPENDENT_AMBULATORY_CARE_PROVIDER_SITE_OTHER): Payer: PRIVATE HEALTH INSURANCE | Admitting: Internal Medicine

## 2013-09-05 VITALS — BP 110/70 | HR 57 | Temp 98.4°F | Ht 66.25 in | Wt 169.8 lb

## 2013-09-05 DIAGNOSIS — E039 Hypothyroidism, unspecified: Secondary | ICD-10-CM

## 2013-09-05 DIAGNOSIS — R5381 Other malaise: Secondary | ICD-10-CM

## 2013-09-05 DIAGNOSIS — E7800 Pure hypercholesterolemia, unspecified: Secondary | ICD-10-CM

## 2013-09-05 DIAGNOSIS — M25571 Pain in right ankle and joints of right foot: Secondary | ICD-10-CM

## 2013-09-05 DIAGNOSIS — K219 Gastro-esophageal reflux disease without esophagitis: Secondary | ICD-10-CM

## 2013-09-05 DIAGNOSIS — F439 Reaction to severe stress, unspecified: Secondary | ICD-10-CM

## 2013-09-05 DIAGNOSIS — R5383 Other fatigue: Secondary | ICD-10-CM

## 2013-09-05 DIAGNOSIS — K148 Other diseases of tongue: Secondary | ICD-10-CM

## 2013-09-05 DIAGNOSIS — D509 Iron deficiency anemia, unspecified: Secondary | ICD-10-CM

## 2013-09-05 DIAGNOSIS — M25579 Pain in unspecified ankle and joints of unspecified foot: Secondary | ICD-10-CM

## 2013-09-05 DIAGNOSIS — Z733 Stress, not elsewhere classified: Secondary | ICD-10-CM

## 2013-09-05 DIAGNOSIS — M25559 Pain in unspecified hip: Secondary | ICD-10-CM

## 2013-09-05 DIAGNOSIS — M25551 Pain in right hip: Secondary | ICD-10-CM

## 2013-09-05 DIAGNOSIS — Z1239 Encounter for other screening for malignant neoplasm of breast: Secondary | ICD-10-CM

## 2013-09-05 NOTE — Progress Notes (Signed)
Pre visit review using our clinic review tool, if applicable. No additional management support is needed unless otherwise documented below in the visit note. 

## 2013-09-08 ENCOUNTER — Encounter: Payer: Self-pay | Admitting: Internal Medicine

## 2013-09-08 DIAGNOSIS — M25571 Pain in right ankle and joints of right foot: Secondary | ICD-10-CM | POA: Insufficient documentation

## 2013-09-08 DIAGNOSIS — D649 Anemia, unspecified: Secondary | ICD-10-CM | POA: Insufficient documentation

## 2013-09-08 DIAGNOSIS — K148 Other diseases of tongue: Secondary | ICD-10-CM | POA: Insufficient documentation

## 2013-09-08 NOTE — Assessment & Plan Note (Signed)
Improved since hgb has improved.  Follow.

## 2013-09-08 NOTE — Assessment & Plan Note (Signed)
Controlled on Prilosec.  Follow.  Recent EGD as outlined.

## 2013-09-08 NOTE — Assessment & Plan Note (Signed)
Just evaluated by GI for iron deficient anemia.  Had EGD and colonoscopy.  Last hgb and ferritin improved.  Follow.  Currently doing well.  Energy better.

## 2013-09-08 NOTE — Assessment & Plan Note (Signed)
On prozac.  Appears to be doing well.  Follow.  

## 2013-09-08 NOTE — Assessment & Plan Note (Signed)
Low cholesterol diet.  Follow lipid panel.    

## 2013-09-08 NOTE — Assessment & Plan Note (Signed)
S/p hip surgery.  Has done well.  Following with Dr Hooten.    

## 2013-09-08 NOTE — Progress Notes (Signed)
Subjective:    Patient ID: Sydney Anderson, female    DOB: 19-Sep-1950, 63 y.o.   MRN: [id number]  HPI 63 year old female with past history of hypothyroidism and GERD who comes in today for a scheduled follow up.,  States she has been doing relatively well.  She is s/p right hip surgery 10/14.  Did well with the surgery.  Seeing Dr Ernest PineHooten.   Hip is doing ok.  Breathing stable.  Still dealing with a lot of stress at home with her financial situation and her husbands issues.  She feels she is handling things relatively well - doing better with this.  Blood pressure has been doing well.  Just recently underwent w/up for iron deficient anemia.  Had EGD and colonoscopy.  On iron.  Last hgb improved.  During the EGD a growth was found on the base of her tongue.  Has not seen ENT for this.  No abdominal pain or cramping.  Bowels stable.  She does report increased pain with her right ankle.  Hurts to walk.  No known injury or trauma.     Past Medical History  Diagnosis Date  . Asthma   . Arthritis   . Depression   . GERD (gastroesophageal reflux disease)   . Hypertension   . Diverticulitis   . Kidney stones   . Migraines   . Hypothyroidism     Current Outpatient Prescriptions on File Prior to Visit  Medication Sig Dispense Refill  . aspirin 81 MG tablet Take 81 mg by mouth daily.      . Cholecalciferol (VITAMIN D3 PO) Take 1,200 Units by mouth daily.      Marland Kitchen. FLUoxetine (PROZAC) 10 MG tablet Take 1 tablet (10 mg total) by mouth daily.  30 tablet  2  . levothyroxine (SYNTHROID, LEVOTHROID) 50 MCG tablet Take 1 tablet (50 mcg total) by mouth daily.  30 tablet  11  . Lutein 20 MG CAPS Take by mouth daily.      . Magnesium 250 MG TABS Take by mouth daily. As needed      . methylcellulose (ARTIFICIAL TEARS) 1 % ophthalmic solution Place 1 drop into both eyes as needed.      Marland Kitchen. omeprazole (PRILOSEC) 20 MG capsule Take 20 mg by mouth daily.      . ranitidine (ZANTAC) 150 MG capsule Take 150 mg by mouth  daily.      . sodium chloride (OCEAN) 0.65 % nasal spray Place 1 spray into the nose as needed.      . timolol (BETIMOL) 0.5 % ophthalmic solution Place 1 drop into the right eye daily.      . Turmeric Curcumin 500 MG CAPS Take 2 capsules by mouth 2 (two) times daily.       No current facility-administered medications on file prior to visit.    Review of Systems Patient did not report headache, lightheadedness or dizziness.  No significant sinus or allergy issues.  No chest pain, tightness or palpitations.  No increased shortness of breath, cough or congestion.  Breathing stable.  No increased acid reflux.  Takes Prilosec and this controls.  No nausea or vomiting.  No abdominal pain or cramping.  No bowel change, such as diarrhea, constipation, BRBPR or melana.  No urine change.   S/p hip surgery.  Blood pressure has been doing well.  Increased ankle pain as outlined.          Objective:   Physical Exam  Filed Vitals:  09/05/13 1131  BP: 110/70  Pulse: 57  Temp: 98.4 F (36.9 C)  .  Blood pressure recheck:  81/28  63 year old female in no acute distress.   HEENT:  Nares- clear.  Oropharynx - without lesions. NECK:  Supple.  Nontender.  No audible bruit.  HEART:  Appears to be regular. LUNGS:  No crackles or wheezing audible.  Respirations even and unlabored.  RADIAL PULSE:  Equal bilaterally.    ABDOMEN:  Soft, nontender.  Bowel sounds present and normal.  No audible abdominal bruit.    EXTREMITIES:  No increased edema present.  DP pulses palpable and equal bilaterally.   MSK:  Increased soft tissue swelling lateral right ankle.  Increased pain to palpation at the base of the ankle.  No pain to palpation over the achilles.  No pain with rotation of her foot or flexion extension at the ankle.             Assessment & Plan:  PULMONARY.  Breathing is stable.  Follow.    HEALTH MAINTENANCE.  Physical 06/03/13.  Mammogram 10/02/12 - Birads I.  Schedule f/u mammogram.  Colonscopy  04/16/10.   I spent 25 minutes with the patient and more than 50% of the time was spent in consultation regarding the above.

## 2013-09-08 NOTE — Assessment & Plan Note (Signed)
On Synthroid.  Follow tsh.    

## 2013-09-08 NOTE — Assessment & Plan Note (Signed)
Growth found at the base of the tongue during EGD.  Refer to ENT for evaluation.

## 2013-09-08 NOTE — Assessment & Plan Note (Signed)
Increased ankle pain and swelling as outlined.  Discussed with her regarding further w/up including xray and/or referral.  She declines.  Ankle support.  May need post op shoe to help keep pressure off the foot.  Will notify me if she changes her mind.

## 2013-09-11 ENCOUNTER — Encounter: Payer: Self-pay | Admitting: Internal Medicine

## 2013-09-11 DIAGNOSIS — D509 Iron deficiency anemia, unspecified: Secondary | ICD-10-CM

## 2013-09-11 DIAGNOSIS — K219 Gastro-esophageal reflux disease without esophagitis: Secondary | ICD-10-CM

## 2013-09-17 ENCOUNTER — Encounter: Payer: Self-pay | Admitting: Internal Medicine

## 2013-09-17 DIAGNOSIS — M79673 Pain in unspecified foot: Secondary | ICD-10-CM

## 2013-09-17 NOTE — Telephone Encounter (Signed)
Order placed for podiatry referral.  My chart message sent to pt.

## 2013-09-18 ENCOUNTER — Encounter: Payer: Self-pay | Admitting: Internal Medicine

## 2013-09-23 ENCOUNTER — Ambulatory Visit: Payer: PRIVATE HEALTH INSURANCE | Admitting: Internal Medicine

## 2013-10-03 ENCOUNTER — Ambulatory Visit: Payer: Self-pay | Admitting: Internal Medicine

## 2013-10-28 ENCOUNTER — Encounter: Payer: Self-pay | Admitting: Internal Medicine

## 2013-10-31 ENCOUNTER — Other Ambulatory Visit (INDEPENDENT_AMBULATORY_CARE_PROVIDER_SITE_OTHER): Payer: PRIVATE HEALTH INSURANCE

## 2013-10-31 DIAGNOSIS — D509 Iron deficiency anemia, unspecified: Secondary | ICD-10-CM

## 2013-11-01 LAB — CBC WITH DIFFERENTIAL/PLATELET
Basophils Absolute: 0.1 10*3/uL (ref 0.0–0.1)
Basophils Relative: 0.8 % (ref 0.0–3.0)
Eosinophils Absolute: 0.2 10*3/uL (ref 0.0–0.7)
Eosinophils Relative: 2.5 % (ref 0.0–5.0)
HCT: 40.3 % (ref 36.0–46.0)
Hemoglobin: 13.4 g/dL (ref 12.0–15.0)
Lymphocytes Relative: 33.6 % (ref 12.0–46.0)
Lymphs Abs: 2.4 10*3/uL (ref 0.7–4.0)
MCHC: 33.3 g/dL (ref 30.0–36.0)
MCV: 85.8 fl (ref 78.0–100.0)
MONO ABS: 0.4 10*3/uL (ref 0.1–1.0)
MONOS PCT: 5.2 % (ref 3.0–12.0)
NEUTROS PCT: 57.9 % (ref 43.0–77.0)
Neutro Abs: 4.1 10*3/uL (ref 1.4–7.7)
PLATELETS: 275 10*3/uL (ref 150.0–400.0)
RBC: 4.69 Mil/uL (ref 3.87–5.11)
RDW: 14.2 % (ref 11.5–15.5)
WBC: 7.2 10*3/uL (ref 4.0–10.5)

## 2013-11-01 LAB — FERRITIN: FERRITIN: 18.8 ng/mL (ref 10.0–291.0)

## 2013-11-03 ENCOUNTER — Encounter: Payer: Self-pay | Admitting: Internal Medicine

## 2013-11-14 ENCOUNTER — Ambulatory Visit: Payer: PRIVATE HEALTH INSURANCE | Admitting: Internal Medicine

## 2013-12-04 ENCOUNTER — Other Ambulatory Visit: Payer: Self-pay | Admitting: *Deleted

## 2013-12-04 MED ORDER — FLUOXETINE HCL 10 MG PO TABS: 10.0000 mg | ORAL_TABLET | Freq: Every day | ORAL | Status: DC

## 2013-12-05 ENCOUNTER — Encounter: Payer: Self-pay | Admitting: Internal Medicine

## 2013-12-05 ENCOUNTER — Ambulatory Visit (INDEPENDENT_AMBULATORY_CARE_PROVIDER_SITE_OTHER): Payer: PRIVATE HEALTH INSURANCE | Admitting: Internal Medicine

## 2013-12-05 VITALS — BP 110/70 | HR 54 | Temp 98.0°F | Ht 66.25 in | Wt 173.2 lb

## 2013-12-05 DIAGNOSIS — K148 Other diseases of tongue: Secondary | ICD-10-CM

## 2013-12-05 DIAGNOSIS — M79604 Pain in right leg: Secondary | ICD-10-CM

## 2013-12-05 DIAGNOSIS — Z658 Other specified problems related to psychosocial circumstances: Secondary | ICD-10-CM

## 2013-12-05 DIAGNOSIS — E039 Hypothyroidism, unspecified: Secondary | ICD-10-CM

## 2013-12-05 DIAGNOSIS — E7800 Pure hypercholesterolemia, unspecified: Secondary | ICD-10-CM

## 2013-12-05 DIAGNOSIS — K219 Gastro-esophageal reflux disease without esophagitis: Secondary | ICD-10-CM

## 2013-12-05 DIAGNOSIS — D509 Iron deficiency anemia, unspecified: Secondary | ICD-10-CM

## 2013-12-05 DIAGNOSIS — F439 Reaction to severe stress, unspecified: Secondary | ICD-10-CM

## 2013-12-05 NOTE — Progress Notes (Signed)
Pre visit review using our clinic review tool, if applicable. No additional management support is needed unless otherwise documented below in the visit note. 

## 2013-12-14 DIAGNOSIS — M25552 Pain in left hip: Secondary | ICD-10-CM | POA: Insufficient documentation

## 2013-12-14 DIAGNOSIS — G8929 Other chronic pain: Secondary | ICD-10-CM | POA: Insufficient documentation

## 2013-12-14 DIAGNOSIS — M79604 Pain in right leg: Secondary | ICD-10-CM

## 2013-12-14 NOTE — Progress Notes (Signed)
Subjective:    Patient ID: Sydney Anderson, female    DOB: 1950-09-04, 63 y.o.   MRN: [id number]  HPI 63 year old female with past history of hypothyroidism and GERD who comes in today for a scheduled follow up.,  States she has been doing relatively well.  She is s/p right hip surgery 10/14.  Did well with the surgery.  Seeing Dr Ernest PineHooten.   She is having increased right leg pain.  Using a cane.  Breathing stable. Still dealing with a lot of stress at home with her financial situation and her husbands issues.  She feels she is handling things relatively well - doing better with this.  Desires no further intervention.   Blood pressure has been doing well.  Just recently underwent w/up for iron deficient anemia.  Had EGD and colonoscopy.  On iron.  Last hgb improved.  During the EGD a growth was found on the base of her tongue.  Has not seen ENT for this.  No abdominal pain or cramping.  Bowels stable.      Past Medical History  Diagnosis Date  . Asthma   . Arthritis   . Depression   . GERD (gastroesophageal reflux disease)   . Hypertension   . Diverticulitis   . Kidney stones   . Migraines   . Hypothyroidism     Current Outpatient Prescriptions on File Prior to Visit  Medication Sig Dispense Refill  . aspirin 81 MG tablet Take 81 mg by mouth daily.    . Cholecalciferol (VITAMIN D3 PO) Take 1,200 Units by mouth daily.    Marland Kitchen. FLUoxetine (PROZAC) 10 MG tablet Take 1 tablet (10 mg total) by mouth daily. 30 tablet 2  . levothyroxine (SYNTHROID, LEVOTHROID) 50 MCG tablet Take 1 tablet (50 mcg total) by mouth daily. 30 tablet 11  . Lutein 20 MG CAPS Take by mouth daily.    . Magnesium 250 MG TABS Take by mouth daily. As needed    . methylcellulose (ARTIFICIAL TEARS) 1 % ophthalmic solution Place 1 drop into both eyes as needed.    Marland Kitchen. omeprazole (PRILOSEC) 20 MG capsule Take 20 mg by mouth daily.    . ranitidine (ZANTAC) 150 MG capsule Take 150 mg by mouth daily.    . sodium chloride (OCEAN)  0.65 % nasal spray Place 1 spray into the nose as needed.    . timolol (BETIMOL) 0.5 % ophthalmic solution Place 1 drop into the right eye daily.     No current facility-administered medications on file prior to visit.    Review of Systems Patient did not report headache, lightheadedness or dizziness.  No significant sinus or allergy issues.  No chest pain, tightness or palpitations.  No increased shortness of breath, cough or congestion.  Breathing stable.  No increased acid reflux.  Takes Prilosec and this controls.  No nausea or vomiting.  No abdominal pain or cramping.  No bowel change, such as diarrhea, constipation, BRBPR or melana.  No urine change.   S/p hip surgery.  Blood pressure has been doing well.  Increased right leg pain as outlined.           Objective:   Physical Exam  Filed Vitals:   12/05/13 1500  BP: 110/70  Pulse: 54  Temp: 98 F (36.7 C)  .  63 year old female in no acute distress.   HEENT:  Nares- clear.  Oropharynx - without lesions. NECK:  Supple.  Nontender.  No audible bruit.  HEART:  Appears to be regular. LUNGS:  No crackles or wheezing audible.  Respirations even and unlabored.  RADIAL PULSE:  Equal bilaterally.    ABDOMEN:  Soft, nontender.  Bowel sounds present and normal.  No audible abdominal bruit.    EXTREMITIES:  No increased edema present.  DP pulses palpable and equal bilaterally.               Assessment & Plan:  PULMONARY.  Breathing is stable.  Follow.    Gastroesophageal reflux disease without esophagitis Controlled on prilosec.   EGD 08/26/13 normal esophagus, hiatal hernia, gastritis.    Hypothyroidism, unspecified hypothyroidism type On synthroid.  Follow tsh.  Lab Results  Component Value Date   TSH 0.97 06/03/2013   Hypercholesteremia Low cholesterol diet.  Follow lipid panel.   Lab Results  Component Value Date   CHOL 190 08/22/2013   HDL 54.90 08/22/2013   LDLCALC 116* 08/22/2013   TRIG 97.0 08/22/2013   CHOLHDL 3  08/22/2013   Stress Increased stress.  Desires no further intervention.  Follow.   Iron deficiency anemia Colonoscopy 08/26/13 two polyps present.  Pathology tubular adenoma and hyperplastic polyp.  Recommended f/u colonoscopy 08/2018.   Tongue lesion Refer to ENT.   Right leg pain Persistent, worsening pain in right leg pain.  Discussed further evaluation and treatment.  She declines.    HEALTH MAINTENANCE.  Physical 06/03/13.  Mammogram 10/04/13 - [facility].  Colonscopy 04/16/10.

## 2014-03-06 ENCOUNTER — Other Ambulatory Visit: Payer: Self-pay | Admitting: *Deleted

## 2014-03-06 MED ORDER — FLUOXETINE HCL 10 MG PO TABS: 10.0000 mg | ORAL_TABLET | Freq: Every day | ORAL | Status: DC

## 2014-03-10 ENCOUNTER — Other Ambulatory Visit (INDEPENDENT_AMBULATORY_CARE_PROVIDER_SITE_OTHER): Payer: PRIVATE HEALTH INSURANCE

## 2014-03-10 DIAGNOSIS — Z658 Other specified problems related to psychosocial circumstances: Secondary | ICD-10-CM

## 2014-03-10 DIAGNOSIS — F439 Reaction to severe stress, unspecified: Secondary | ICD-10-CM

## 2014-03-10 DIAGNOSIS — D509 Iron deficiency anemia, unspecified: Secondary | ICD-10-CM

## 2014-03-10 DIAGNOSIS — E039 Hypothyroidism, unspecified: Secondary | ICD-10-CM

## 2014-03-10 DIAGNOSIS — E7800 Pure hypercholesterolemia, unspecified: Secondary | ICD-10-CM

## 2014-03-10 LAB — LIPID PANEL
Cholesterol: 196 mg/dL (ref 0–200)
HDL: 57.1 mg/dL (ref >39.00)
LDL Cholesterol: 120 mg/dL — ABNORMAL HIGH (ref 0–99)
NonHDL: 138.9
TRIGLYCERIDES: 97 mg/dL (ref 0.0–149.0)
Total CHOL/HDL Ratio: 3
VLDL: 19.4 mg/dL (ref 0.0–40.0)

## 2014-03-10 LAB — COMPREHENSIVE METABOLIC PANEL
ALT: 13 U/L (ref 0–35)
AST: 15 U/L (ref 0–37)
Albumin: 4.3 g/dL (ref 3.5–5.2)
Alkaline Phosphatase: 90 U/L (ref 39–117)
BUN: 13 mg/dL (ref 6–23)
CO2: 27 mEq/L (ref 19–32)
CREATININE: 0.73 mg/dL (ref 0.40–1.20)
Calcium: 9.4 mg/dL (ref 8.4–10.5)
Chloride: 106 mEq/L (ref 96–112)
GFR: 85.87 mL/min (ref >60.00)
Glucose, Bld: 92 mg/dL (ref 70–99)
POTASSIUM: 4.5 meq/L (ref 3.5–5.1)
Sodium: 140 mEq/L (ref 135–145)
Total Bilirubin: 0.5 mg/dL (ref 0.2–1.2)
Total Protein: 6.8 g/dL (ref 6.0–8.3)

## 2014-03-10 LAB — CBC WITH DIFFERENTIAL/PLATELET
Basophils Absolute: 0.1 10*3/uL (ref 0.0–0.1)
Basophils Relative: 0.8 % (ref 0.0–3.0)
EOS ABS: 0.2 10*3/uL (ref 0.0–0.7)
EOS PCT: 3.5 % (ref 0.0–5.0)
HCT: 41.2 % (ref 36.0–46.0)
HEMOGLOBIN: 14.1 g/dL (ref 12.0–15.0)
LYMPHS PCT: 32.1 % (ref 12.0–46.0)
Lymphs Abs: 2.2 10*3/uL (ref 0.7–4.0)
MCHC: 34.3 g/dL (ref 30.0–36.0)
MCV: 85.6 fl (ref 78.0–100.0)
MONO ABS: 0.3 10*3/uL (ref 0.1–1.0)
Monocytes Relative: 4.9 % (ref 3.0–12.0)
Neutro Abs: 4.1 10*3/uL (ref 1.4–7.7)
Neutrophils Relative %: 58.7 % (ref 43.0–77.0)
PLATELETS: 282 10*3/uL (ref 150.0–400.0)
RBC: 4.82 Mil/uL (ref 3.87–5.11)
RDW: 13.1 % (ref 11.5–15.5)
WBC: 7 10*3/uL (ref 4.0–10.5)

## 2014-03-10 LAB — FERRITIN: Ferritin: 21.9 ng/mL (ref 10.0–291.0)

## 2014-03-10 LAB — TSH: TSH: 3.06 u[IU]/mL (ref 0.35–4.50)

## 2014-03-13 ENCOUNTER — Encounter: Payer: Self-pay | Admitting: Internal Medicine

## 2014-03-27 NOTE — Telephone Encounter (Signed)
Unread mychart message mailed to patient 

## 2014-05-30 NOTE — Op Note (Signed)
PATIENT NAME:  Sydney Anderson, Sydney Anderson MR#:  [id number] DATE OF BIRTH:  [birth year]  DATE OF PROCEDURE:  11/19/2012  PREOPERATIVE DIAGNOSIS: Degenerative arthrosis of the right hip.   POSTOPERATIVE DIAGNOSIS: Degenerative arthrosis of the right hip.   PROCEDURE PERFORMED: Right total hip arthroplasty.   SURGEON: Illene Labrador. Hooten, M.D.   ASSISTANT: Van Clines, PA (required to maintain retraction throughout the procedure.)   ANESTHESIA: Spinal.   ESTIMATED BLOOD LOSS: 250 mL.   FLUIDS REPLACED: 1800 mL of crystalloid.   DRAINS: Two medium drains to Hemovac reservoir.  IMPLANTS UTILIZED: DePuy 16.5 mm small stature AML femoral component, 52 mm outer diameter Pinnacle 100 acetabular component, +4 mm, 10 degree Pinnacle Marathon polyethylene liner and a 36 mm outer diameter M-Spec femoral head with a + 5 mm neck length.   INDICATIONS FOR SURGERY: The patient is a 64 year old female, who has been seen for complaints of progressive right hip and groin pain. X-rays demonstrated severe degenerative changes with bone-on-bone articulation appreciated. After discussion of the risks and benefits of surgical intervention, the patient expressed understanding of the risks, benefits, and agreed with plans for surgical intervention.   PROCEDURE IN DETAIL: The patient was brought to the operating room and after adequate spinal anesthesia was achieved, the patient was placed in a left lateral decubitus position. Axillary roll was placed and all bony prominences were well padded. The patient's right hip and leg were cleaned and prepped with alcohol and DuraPrep and draped in the usual sterile fashion. A "timeout" was performed as per usual protocol. A lateral curvilinear incision was made gently curving towards the posterior superior iliac spine. IT band was incised in line with the skin incision and the fibers of the gluteus maximus were split in line. The piriformis tendon was identified, skeletonized, and incised at  its insertion on the proximal femur and reflected posteriorly. In a similar fashion, short external rotators were incised and reflected posteriorly. A T-type posterior capsulotomy was performed. Prior to dislocation of the femoral head, a threaded Steinmann pin was inserted through a separate stab incision into the pelvis superior to the acetabulum and then bent in the form of a stylus so as to assess limb length and hip offset throughout the procedure. The femoral head was then dislocated posteriorly. Inspection of the femoral head demonstrated severe degenerative changes with gross irregularity of the articular surface with full thickness cartilage loss.   The femoral neck cut was performed using an oscillating saw. The anterior capsule was elevated off of the femoral neck. Inspection of the acetabulum also demonstrated significant degenerative changes. Remnant of the labrum was excised. The acetabulum was reamed in a sequential fashion up to a 51 mm diameter. Good punctate bleeding bone was encountered. A 52 mm outer diameter Pinnacle 100 acetabular component was positioned and impacted into place. Good scratch fit was appreciated. A +4 neutral polyethylene trial was inserted and attention was directed to the proximal femur. Pilot hole for reaming of the proximal femoral canal was created using a high-speed bur. Proximal femoral canal was then reamed in a sequential fashion up to a 16 mm diameter. This allowed for approximately 5 cm of scratch fit. 16.5 mm aggressive side-biting reamer was used to prepare the proximal femur. Serial broaches were inserted up to a 16.5 mm small stature broach. The calcar region was planed and trial reduction was performed using first a  +1.5 and subsequently a +5 mm neck length. Good equalization of limb lengths was appreciated. Reasonably good stability  was appreciated, but it was elected to trial with a 10 degree liner. A +4 mm 10 degree trial was placed and the hip was again  reduced, Excellent stability was appreciated both anteriorly and posteriorly. Trial components were removed.   The acetabular shell was irrigated with copious amounts of normal saline with antibiotic solution. The +4 mm, 10 degree Pinnacle Marathon polyethylene liner was positioned with the high side at approximately the 8 o'clock position and impacted into place. Next, a 16.5 mm small stature AML femoral component was positioned and impacted into place. Excellent scratch fit was appreciated. A 36 mm hip ball trial with a + 5 mm neck segment was placed on the trunnion and reduced. Good equalization of limb lengths was appreciated and good restoration of hip offset noted. Excellent stability was appreciated both anteriorly and posteriorly. Trial hip ball was removed. The Morse taper was cleaned and dried. A 36 mm outer diameter M-Spec femoral head with A+ 5 mm neck length was placed on the trunnion and impacted into place. The hip was reduced and placed through a range of motion. Excellent stability was appreciated both anteriorly and posteriorly. Good equalization of limb lengths was noted and good restoration of hip offset noted.   The wound was irrigated with copious amounts of normal saline with antibiotic solution using pulsatile lavage and then suctioned dry. Good hemostasis was appreciated. The posterior capsulotomy was repaired using #5 Ethibond. The piriformis tendon was reapproximated on the undersurface of the gluteus medius tendon using #5 Ethibond. Two medium drains were placed in the wound bed and brought out through a separate stab incision to be attached to a Hemovac reservoir. IT band was repaired using interrupted sutures of #1 Vicryl. The subcutaneous tissue was approximated in layers using first 0 Vicryl followed 2-0 Vicryl. Skin was closed with skin staples. Sterile dressing was applied. The patient tolerated the procedure well. She was transported to the recovery room in stable condition.   ____________________________ Illene LabradorJames P. Angie FavaHooten Jr., MD jph:aw D: 11/19/2012 10:31:13 ET T: 11/19/2012 10:44:17 ET JOB#: [id number]  cc: Fayrene FearingJames P. Angie FavaHooten Jr., MD, <Dictator> JAMES P Angie FavaHOOTEN JR MD ELECTRONICALLY SIGNED 11/19/2012 23:33

## 2014-05-30 NOTE — Discharge Summary (Signed)
PATIENT NAME:  Sydney Anderson, Sydney Anderson MR#:  [id number] DATE OF BIRTH:  May 13, 1950  DATE OF ADMISSION:  11/19/2012 DATE OF DISCHARGE: 11/22/2012   ADMITTING DIAGNOSIS: Degenerative arthrosis of the right hip.   DISCHARGE DIAGNOSIS: Degenerative arthrosis of the right hip.   HISTORY: The patient is a pleasant 64 year old, who has been followed at [facility] for progression of right hip and groin pain. She reported a several year history of discomfort to the  hip. She notes no specific injury or any change of activities, which may have led to this onset of discomfort. The patient has been treated conservatively with anti-inflammatory medications as well as activity modification. She was also seen by Dr. Yves Dill for which an injection to the trochanteric bursa region as well as an intra-articular hip injection was given. She saw minimal to no relief with these injections. She states that the pain had increased to the point that it was significantly interfering with her activities of daily living. X-rays were taken in [facility] orthopedics. Severe degenerative joint disease with bone-on-bone articulation was appreciated. After discussion of the risks and benefits of surgical intervention, the patient expressed her understanding of the risks and benefits and agreed for plans for surgical intervention.   HOSPITAL COURSE:   PROCEDURE: Right total hip arthroplasty.   ANESTHESIA: Spinal.   IMPLANTS UTILIZED: DePuy 16.5 mm small stature AML femoral component, 52 mm outer diameter Pinnacle 100 acetabular component, a +4 mm, 10 degree Pinnacle Marathon polyethylene liner, and a 36 mm outer diameter M-Spec femoral head with a + 5 mm neck length.   The patient tolerated the procedure very well. She had no complications. She was then taken to the PACU where she was stabilized and then transferred to the orthopedic floor. She began receiving anticoagulation therapy of Lovenox 30 mg subcutaneous every 12 hours  per anesthesia and pharmacy protocol. She was fitted with TED stockings bilaterally. These were allowed to be removed 1 hour per 8 hour shift. The patient was also fitted with AVI compression foot pumps bilaterally set at 80 mmHg. Her calves have been nontender. Negative Homans sign. Heels were elevated off the bed using rolled towels.   The patient's vital signs have been stable. She has been afebrile. Hemodynamically, she was stable. No transfusions were given.   Physical therapy was initiated on day 1 for gait training and transfers. Upon being discharged, she was ambulating greater than 200 feet. She was independent with bed to chair transfers. She was able go up and down 4 sets of steps. Occupational therapy was also initiated on day 1 for activities of daily living and assistive devices.   The patient has done very well and is being discharged to home.   DISCHARGE INSTRUCTIONS: 1.  She is to continue weight-bearing as tolerated to the surgical leg. Continue using a walker until cleared by physical therapy to go to a quad cane.  2.  Continue TED stockings. These are to be worn during the day, but may be removed at night.  Elevate heels off the bed.  3.  Incentive spirometer q.1 hour while awake. Encourage cough and deep breathing q.2 hours while awake.  4.  She is placed on a regular diet.  5.  Dressing will need to be changed as needed. Remove the staples and apply benzoin and Steri-Strips on 12/03/2012.  6.  She is to call the clinic if she has any temperatures of greater than 101.5 or greater or excessive bleeding.  7.  She  has a followup appointment with Dr. Ernest PineHooten on 11/25 at 9:30.  8.  She is to resume her regular medications that she was on prior to admission. She was given a prescription for oxycodone 5 to 10 mg every 4 to 6 hours p.r.n. for pain, Ultram 50 to 100 mg every 4 to 6 hours p.r.n. for pain, Lovenox 40 mg subcutaneously daily for 14 days, then discontinue and begin taking one  81 mg enteric-coated aspirin.   PAST MEDICAL HISTORY:  1.  Hypothyroidism. 2.  Hernia. 3.  Reactive airway disease. 4.  Anemia. 5.  Depression.  6.  Hypertension.  7.  Pleurisy.  8.  Glaucoma.  9.  Migraines.  10. Hyperlipidemia. 11. Questionable atrial fibrillation in 2008. 12. 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  ____________________________ Van ClinesJon Draylon Mercadel, PA jrw:aw D: 11/22/2012 07:51:50 ET T: 11/22/2012 08:43:24 ET JOB#: [id number]  cc: Van ClinesJon Yoav Okane, PA, <Dictator> Yehudit Fulginiti PA ELECTRONICALLY SIGNED 12/04/2012 8:23

## 2014-06-06 ENCOUNTER — Ambulatory Visit: Payer: PRIVATE HEALTH INSURANCE | Admitting: Internal Medicine

## 2014-06-18 ENCOUNTER — Other Ambulatory Visit: Payer: Self-pay | Admitting: Orthopedic Surgery

## 2014-06-18 DIAGNOSIS — M5416 Radiculopathy, lumbar region: Secondary | ICD-10-CM

## 2014-06-23 ENCOUNTER — Ambulatory Visit
Admission: RE | Admit: 2014-06-23 | Discharge: 2014-06-23 | Disposition: A | Discharge status: Home / Self Care | Payer: PRIVATE HEALTH INSURANCE | Source: Ambulatory Visit | Attending: Orthopedic Surgery | Admitting: Orthopedic Surgery

## 2014-06-23 DIAGNOSIS — M4806 Spinal stenosis, lumbar region: Secondary | ICD-10-CM | POA: Insufficient documentation

## 2014-06-23 DIAGNOSIS — M5136 Other intervertebral disc degeneration, lumbar region: Secondary | ICD-10-CM | POA: Diagnosis not present

## 2014-06-23 DIAGNOSIS — M5416 Radiculopathy, lumbar region: Secondary | ICD-10-CM | POA: Diagnosis present

## 2014-07-02 ENCOUNTER — Ambulatory Visit: Payer: PRIVATE HEALTH INSURANCE | Admitting: Internal Medicine

## 2014-07-03 ENCOUNTER — Other Ambulatory Visit: Payer: Self-pay | Admitting: *Deleted

## 2014-07-03 MED ORDER — FLUOXETINE HCL 10 MG PO TABS: 10.0000 mg | ORAL_TABLET | Freq: Every day | ORAL | Status: DC

## 2014-07-03 MED ORDER — LEVOTHYROXINE SODIUM 50 MCG PO TABS: 50.0000 ug | ORAL_TABLET | Freq: Every day | ORAL | Status: DC

## 2014-07-03 NOTE — Telephone Encounter (Signed)
Appt sch 09/08/14

## 2014-09-08 ENCOUNTER — Ambulatory Visit (INDEPENDENT_AMBULATORY_CARE_PROVIDER_SITE_OTHER): Payer: PRIVATE HEALTH INSURANCE | Admitting: Internal Medicine

## 2014-09-08 ENCOUNTER — Encounter: Payer: Self-pay | Admitting: Internal Medicine

## 2014-09-08 ENCOUNTER — Other Ambulatory Visit (HOSPITAL_COMMUNITY)
Admission: RE | Admit: 2014-09-08 | Discharge: 2014-09-08 | Disposition: A | Discharge status: Home / Self Care | Payer: PRIVATE HEALTH INSURANCE | Source: Ambulatory Visit | Attending: Internal Medicine | Admitting: Internal Medicine

## 2014-09-08 VITALS — BP 100/60 | HR 58 | Temp 98.2°F | Ht 66.0 in | Wt 174.1 lb

## 2014-09-08 DIAGNOSIS — Z124 Encounter for screening for malignant neoplasm of cervix: Secondary | ICD-10-CM

## 2014-09-08 DIAGNOSIS — E039 Hypothyroidism, unspecified: Secondary | ICD-10-CM

## 2014-09-08 DIAGNOSIS — E7800 Pure hypercholesterolemia, unspecified: Secondary | ICD-10-CM

## 2014-09-08 DIAGNOSIS — Z1151 Encounter for screening for human papillomavirus (HPV): Secondary | ICD-10-CM | POA: Insufficient documentation

## 2014-09-08 DIAGNOSIS — F439 Reaction to severe stress, unspecified: Secondary | ICD-10-CM

## 2014-09-08 DIAGNOSIS — Z0000 Encounter for general adult medical examination without abnormal findings: Secondary | ICD-10-CM | POA: Diagnosis not present

## 2014-09-08 DIAGNOSIS — K219 Gastro-esophageal reflux disease without esophagitis: Secondary | ICD-10-CM

## 2014-09-08 DIAGNOSIS — Z01419 Encounter for gynecological examination (general) (routine) without abnormal findings: Secondary | ICD-10-CM | POA: Diagnosis present

## 2014-09-08 DIAGNOSIS — M4800 Spinal stenosis, site unspecified: Secondary | ICD-10-CM

## 2014-09-08 DIAGNOSIS — D509 Iron deficiency anemia, unspecified: Secondary | ICD-10-CM

## 2014-09-08 MED ORDER — FLUOXETINE HCL 10 MG PO TABS: 10.0000 mg | ORAL_TABLET | Freq: Every day | ORAL | Status: DC

## 2014-09-08 NOTE — Progress Notes (Signed)
Patient ID: Sydney Anderson, female   DOB: 04/26/1950, 64 y.o.   MRN: [id number]   Subjective:    Patient ID: Sydney Anderson, female    DOB: 1950/02/11, 64 y.o.   MRN: [id number]  HPI  Patient here to follow up on her current medical issues as well as for a complete physical exam.  Main complaint is that of back and let pain.  Saw Dr Ernest Pine.  MRI - stenosis as outlined.  Saw Dr Yves Dill.  Had injection.  Some improvement in her right leg.  Having more pain in her back - s/p a fall.  F/u xray did not reveal acute abnormality.  She is due to see Dr Yves Dill tomorrow.  Doing some back exercises.  Not able to exercise as she was doing previously.  Tries to stay active.  No cardiac symptoms with increased activity or exertion.  No sob.  Breathing stable.  No acid reflux reported.  Bowels stable.    Past Medical History  Diagnosis Date  . Asthma   . Arthritis   . Depression   . GERD (gastroesophageal reflux disease)   . Hypertension   . Diverticulitis   . Kidney stones   . Migraines   . Hypothyroidism     Outpatient Encounter Prescriptions as of 09/08/2014  Medication Sig  . aspirin 81 MG tablet Take 81 mg by mouth daily.  Marland Kitchen FLUoxetine (PROZAC) 10 MG tablet Take 1 tablet (10 mg total) by mouth daily.  Marland Kitchen levothyroxine (SYNTHROID, LEVOTHROID) 50 MCG tablet Take 1 tablet (50 mcg total) by mouth daily.  . methylcellulose (ARTIFICIAL TEARS) 1 % ophthalmic solution Place 1 drop into both eyes as needed.  . ranitidine (ZANTAC) 150 MG capsule Take 150 mg by mouth daily.  . sodium chloride (OCEAN) 0.65 % nasal spray Place 1 spray into the nose as needed.  . timolol (BETIMOL) 0.5 % ophthalmic solution Place 1 drop into the right eye daily.  . VOLTAREN 1 % GEL   . [DISCONTINUED] FLUoxetine (PROZAC) 10 MG tablet Take 1 tablet (10 mg total) by mouth daily.  . [DISCONTINUED] Cholecalciferol (VITAMIN D3 PO) Take 1,200 Units by mouth daily.  . [DISCONTINUED] Lutein 20 MG CAPS Take by mouth daily.   . [DISCONTINUED] Magnesium 250 MG TABS Take by mouth daily. As needed  . [DISCONTINUED] omeprazole (PRILOSEC) 20 MG capsule Take 20 mg by mouth daily.   No facility-administered encounter medications on file as of 09/08/2014.    Review of Systems  Constitutional: Negative for appetite change and unexpected weight change.  HENT: Negative for congestion and sinus pressure.   Eyes: Negative for pain and visual disturbance.  Respiratory: Negative for cough, chest tightness and shortness of breath.   Cardiovascular: Negative for chest pain, palpitations and leg swelling.  Gastrointestinal: Negative for nausea, vomiting, abdominal pain and diarrhea.  Genitourinary: Negative for dysuria and difficulty urinating.  Musculoskeletal: Positive for back pain.       Leg pain as outlined.   Skin: Negative for color change and rash.  Neurological: Negative for dizziness, light-headedness and headaches.  Hematological: Negative for adenopathy. Does not bruise/bleed easily.  Psychiatric/Behavioral: Negative for behavioral problems and dysphoric mood.       Increased stress.  Desires no further intervention.        Objective:     Blood pressure recheck:  110/68  Physical Exam  Constitutional: She is oriented to person, place, and time. She appears well-developed and well-nourished.  HENT:  Nose: Nose normal.  Mouth/Throat: Oropharynx is clear and moist.  Eyes: Right eye exhibits no discharge. Left eye exhibits no discharge. No scleral icterus.  Neck: Neck supple. No thyromegaly present.  Cardiovascular: Normal rate and regular rhythm.   Pulmonary/Chest: Breath sounds normal. No accessory muscle usage. No tachypnea. No respiratory distress. She has no decreased breath sounds. She has no wheezes. She has no rhonchi. Right breast exhibits no inverted nipple, no mass, no nipple discharge and no tenderness (no axillary adenopathy). Left breast exhibits no inverted nipple, no mass, no nipple discharge and  no tenderness (no axilarry adenopathy).  Abdominal: Soft. Bowel sounds are normal. There is no tenderness.  Genitourinary:  Normal external genitalia.  Vaginal vault without lesions.  Cervix identified.  Pap smear performed.  Could not appreciate any adnexal masses or tenderness.    Musculoskeletal: She exhibits no edema or tenderness.  Lymphadenopathy:    She has no cervical adenopathy.  Neurological: She is alert and oriented to person, place, and time.  Skin: Skin is warm. No rash noted.  Psychiatric: She has a normal mood and affect. Her behavior is normal.    BP 100/60 mmHg  Pulse 58  Temp(Src) 98.2 F (36.8 C) (Oral)  Ht 5\' 6"  (1.676 m)  Wt 174 lb 2 oz (78.983 kg)  BMI 28.12 kg/m2  SpO2 97% Wt Readings from Last 3 Encounters:  09/08/14 174 lb 2 oz (78.983 kg)  12/05/13 173 lb 4 oz (78.586 kg)  09/05/13 169 lb 12 oz (76.998 kg)     Lab Results  Component Value Date   WBC 7.0 03/10/2014   HGB 14.1 03/10/2014   HCT 41.2 03/10/2014   PLT 282.0 03/10/2014   GLUCOSE 92 03/10/2014   CHOL 196 03/10/2014   TRIG 97.0 03/10/2014   HDL 57.10 03/10/2014   LDLCALC 120* 03/10/2014   ALT 13 03/10/2014   AST 15 03/10/2014   NA 140 03/10/2014   K 4.5 03/10/2014   CL 106 03/10/2014   CREATININE 0.73 03/10/2014   BUN 13 03/10/2014   CO2 27 03/10/2014   TSH 3.06 03/10/2014   INR 1.0 11/07/2012    Mr Lumbar Spine Wo Contrast  06/23/2014   CLINICAL DATA:  Low back pain with right buttock and leg pain and numbness, duration of symptoms 4-5 months. Worse with standing and walking.  EXAM: MRI LUMBAR SPINE WITHOUT CONTRAST  TECHNIQUE: Multiplanar, multisequence MR imaging of the lumbar spine was performed. No intravenous contrast was administered.  COMPARISON:  None.  FINDINGS: T12-L1: Old superior endplate compression deformity at L1. Disc degeneration with mild bulging of the disc. No stenosis of the canal or foramina. The distal cord and conus are normal.  L1-2: Desiccation and  bulging of the disc. No compressive stenosis.  L2-3:  Normal interspace.  L3-4: Bilateral facet degeneration and hypertrophy. Anterolisthesis of 4 mm. Bulging of the disc. Mild multifactorial stenosis. No definite neural compression.  L4-5:  Mild bulging of the disc.  No stenosis.  L5-S1:  Normal interspace.  IMPRESSION: Old superior endplate compression fracture of L1. Disc degeneration at T12-L1 but no stenosis or neural compression.  Disc degeneration at L1-2.  No compressive stenosis.  Bilateral facet arthropathy at L3-4 allowing anterolisthesis of 4 mm. Bulging of the disc. Mild multifactorial stenosis of the canal but without definite neural compression. This appearance could worsen with standing or flexion.  Non-compressive disc bulge at L4-5.   Electronically Signed   By: Paulina Fusi M.D.   On: 06/23/2014 08:51  Assessment & Plan:   Problem List Items Addressed This Visit    Anemia    GI w/up for iron deficient anemia.  Colonoscopy 08/26/13.  Recommended f/u colonoscopy in 08/2018.  Follow cbc.        Relevant Orders   CBC with Differential/Platelet   Ferritin   GERD (gastroesophageal reflux disease)    EGD 08/26/13 - as outlined in overview.  No upper symptoms reported.  On zantac.       Health care maintenance    Physical today 09/08/14.  PAP 09/08/14.  Mammogram 10/04/13 - [place].  County schedules these for her.  Colonoscopy 04/16/10.        Hypercholesteremia    Low cholesterol diet and exercise.  Follow lipid panel.       Relevant Orders   Comprehensive metabolic panel   Lipid panel   Hypothyroidism    On thyroid replacement.  Follow tsh.       Spinal stenosis    Seeing orthopedics and Dr Yves Dill.  Still with increased pain.  Due to f/u with Dr Yves Dill tomorrow.        Stress    On prozac.  Desires no further intervention.         Other Visit Diagnoses    Pap smear for cervical cancer screening    -  Primary    Relevant Orders    Cytology - PAP         Dale Stilwell, MD

## 2014-09-08 NOTE — Progress Notes (Signed)
Pre visit review using our clinic review tool, if applicable. No additional management support is needed unless otherwise documented below in the visit note. 

## 2014-09-09 ENCOUNTER — Encounter: Payer: Self-pay | Admitting: Internal Medicine

## 2014-09-09 DIAGNOSIS — M4800 Spinal stenosis, site unspecified: Secondary | ICD-10-CM

## 2014-09-09 DIAGNOSIS — M48062 Spinal stenosis, lumbar region with neurogenic claudication: Secondary | ICD-10-CM | POA: Insufficient documentation

## 2014-09-09 DIAGNOSIS — Z0000 Encounter for general adult medical examination without abnormal findings: Secondary | ICD-10-CM | POA: Insufficient documentation

## 2014-09-09 LAB — CYTOLOGY - PAP

## 2014-09-09 NOTE — Assessment & Plan Note (Signed)
On thyroid replacement.  Follow tsh.  

## 2014-09-09 NOTE — Assessment & Plan Note (Signed)
On prozac.  Desires no further intervention.

## 2014-09-09 NOTE — Assessment & Plan Note (Signed)
Low cholesterol diet and exercise.  Follow lipid panel.   

## 2014-09-09 NOTE — Assessment & Plan Note (Signed)
Seeing orthopedics and Dr Yves Dill.  Still with increased pain.  Due to f/u with Dr Yves Dill tomorrow.

## 2014-09-09 NOTE — Assessment & Plan Note (Signed)
EGD 08/26/13 - as outlined in overview.  No upper symptoms reported.  On zantac.

## 2014-09-09 NOTE — Assessment & Plan Note (Signed)
Physical today 09/08/14.  PAP 09/08/14.  Mammogram 10/04/13 - [place].  County schedules these for her.  Colonoscopy 04/16/10.

## 2014-09-09 NOTE — Assessment & Plan Note (Signed)
GI w/up for iron deficient anemia.  Colonoscopy 08/26/13.  Recommended f/u colonoscopy in 08/2018.  Follow cbc.

## 2014-09-10 ENCOUNTER — Encounter: Payer: Self-pay | Admitting: Internal Medicine

## 2014-10-02 ENCOUNTER — Other Ambulatory Visit: Payer: PRIVATE HEALTH INSURANCE

## 2014-10-16 ENCOUNTER — Other Ambulatory Visit (INDEPENDENT_AMBULATORY_CARE_PROVIDER_SITE_OTHER): Payer: PRIVATE HEALTH INSURANCE

## 2014-10-16 DIAGNOSIS — E7800 Pure hypercholesterolemia, unspecified: Secondary | ICD-10-CM

## 2014-10-16 DIAGNOSIS — D509 Iron deficiency anemia, unspecified: Secondary | ICD-10-CM | POA: Diagnosis not present

## 2014-10-16 LAB — CBC WITH DIFFERENTIAL/PLATELET
BASOS ABS: 0 10*3/uL (ref 0.0–0.1)
Basophils Relative: 0.6 % (ref 0.0–3.0)
EOS PCT: 3.3 % (ref 0.0–5.0)
Eosinophils Absolute: 0.2 10*3/uL (ref 0.0–0.7)
HCT: 40.9 % (ref 36.0–46.0)
HEMOGLOBIN: 13.9 g/dL (ref 12.0–15.0)
Lymphocytes Relative: 30.8 % (ref 12.0–46.0)
Lymphs Abs: 1.8 10*3/uL (ref 0.7–4.0)
MCHC: 33.9 g/dL (ref 30.0–36.0)
MCV: 87.8 fl (ref 78.0–100.0)
MONO ABS: 0.3 10*3/uL (ref 0.1–1.0)
Monocytes Relative: 4.7 % (ref 3.0–12.0)
NEUTROS PCT: 60.6 % (ref 43.0–77.0)
Neutro Abs: 3.6 10*3/uL (ref 1.4–7.7)
Platelets: 274 10*3/uL (ref 150.0–400.0)
RBC: 4.66 Mil/uL (ref 3.87–5.11)
RDW: 13.2 % (ref 11.5–15.5)
WBC: 5.9 10*3/uL (ref 4.0–10.5)

## 2014-10-16 LAB — COMPREHENSIVE METABOLIC PANEL
ALBUMIN: 4.3 g/dL (ref 3.5–5.2)
ALK PHOS: 87 U/L (ref 39–117)
ALT: 13 U/L (ref 0–35)
AST: 16 U/L (ref 0–37)
BILIRUBIN TOTAL: 0.5 mg/dL (ref 0.2–1.2)
BUN: 14 mg/dL (ref 6–23)
CO2: 29 mEq/L (ref 19–32)
Calcium: 9.3 mg/dL (ref 8.4–10.5)
Chloride: 106 mEq/L (ref 96–112)
Creatinine, Ser: 0.71 mg/dL (ref 0.40–1.20)
GFR: 88.49 mL/min (ref >60.00)
Glucose, Bld: 95 mg/dL (ref 70–99)
POTASSIUM: 4.5 meq/L (ref 3.5–5.1)
Sodium: 140 mEq/L (ref 135–145)
TOTAL PROTEIN: 6.8 g/dL (ref 6.0–8.3)

## 2014-10-16 LAB — LIPID PANEL
CHOLESTEROL: 204 mg/dL — AB (ref 0–200)
HDL: 54.5 mg/dL (ref >39.00)
LDL Cholesterol: 130 mg/dL — ABNORMAL HIGH (ref 0–99)
NONHDL: 149.19
TRIGLYCERIDES: 96 mg/dL (ref 0.0–149.0)
Total CHOL/HDL Ratio: 4
VLDL: 19.2 mg/dL (ref 0.0–40.0)

## 2014-10-16 LAB — FERRITIN: FERRITIN: 28.7 ng/mL (ref 10.0–291.0)

## 2014-10-17 ENCOUNTER — Encounter: Payer: Self-pay | Admitting: Internal Medicine

## 2014-12-11 DIAGNOSIS — M5416 Radiculopathy, lumbar region: Secondary | ICD-10-CM | POA: Insufficient documentation

## 2015-03-01 ENCOUNTER — Other Ambulatory Visit: Payer: Self-pay | Admitting: Internal Medicine

## 2015-03-02 NOTE — Telephone Encounter (Signed)
Okay to refill? Last seen on 8/1. Next appt on: 2/3

## 2015-03-02 NOTE — Telephone Encounter (Signed)
ok'd refill prozac #30 with one refill.

## 2015-03-13 ENCOUNTER — Encounter: Payer: Self-pay | Admitting: Internal Medicine

## 2015-03-13 ENCOUNTER — Ambulatory Visit (INDEPENDENT_AMBULATORY_CARE_PROVIDER_SITE_OTHER): Payer: 59 | Admitting: Internal Medicine

## 2015-03-13 VITALS — BP 120/70 | HR 60 | Temp 97.9°F | Resp 18 | Ht 66.0 in | Wt 178.4 lb

## 2015-03-13 DIAGNOSIS — Z1239 Encounter for other screening for malignant neoplasm of breast: Secondary | ICD-10-CM | POA: Diagnosis not present

## 2015-03-13 DIAGNOSIS — K219 Gastro-esophageal reflux disease without esophagitis: Secondary | ICD-10-CM | POA: Diagnosis not present

## 2015-03-13 DIAGNOSIS — F439 Reaction to severe stress, unspecified: Secondary | ICD-10-CM

## 2015-03-13 DIAGNOSIS — E7800 Pure hypercholesterolemia, unspecified: Secondary | ICD-10-CM

## 2015-03-13 DIAGNOSIS — E2839 Other primary ovarian failure: Secondary | ICD-10-CM | POA: Diagnosis not present

## 2015-03-13 DIAGNOSIS — M79604 Pain in right leg: Secondary | ICD-10-CM

## 2015-03-13 DIAGNOSIS — E039 Hypothyroidism, unspecified: Secondary | ICD-10-CM | POA: Diagnosis not present

## 2015-03-13 DIAGNOSIS — M79645 Pain in left finger(s): Secondary | ICD-10-CM

## 2015-03-13 DIAGNOSIS — Z658 Other specified problems related to psychosocial circumstances: Secondary | ICD-10-CM

## 2015-03-13 DIAGNOSIS — M4850XD Collapsed vertebra, not elsewhere classified, site unspecified, subsequent encounter for fracture with routine healing: Secondary | ICD-10-CM

## 2015-03-13 DIAGNOSIS — M4800 Spinal stenosis, site unspecified: Secondary | ICD-10-CM

## 2015-03-13 DIAGNOSIS — IMO0001 Reserved for inherently not codable concepts without codable children: Secondary | ICD-10-CM

## 2015-03-13 MED ORDER — METOPROLOL TARTRATE 50 MG PO TABS: 50.0000 mg | ORAL_TABLET | Freq: Every day | ORAL | Status: DC | PRN

## 2015-03-13 NOTE — Progress Notes (Signed)
Pre-visit discussion using our clinic review tool. No additional management support is needed unless otherwise documented below in the visit note.  

## 2015-03-13 NOTE — Progress Notes (Signed)
Patient ID: Andy Moye, female   DOB: 09-03-1950, 65 y.o.   MRN: [id number]   Subjective:    Patient ID: Annajulia Lewing, female    DOB: 1950-09-21, 65 y.o.   MRN: [id number]  HPI  Patient with past history of GERD, hypertension, depression and hypothyroidism.  She comes in today to follow up on these issues.  Increased stress with her current medical issues.  Increased pain.  Back pain - persistent.  MRI 02/2015 - vertebral fracture with severe stenosis.  Saw a spine specialist.  States surgery - if gets to point of not walking, etc.  Followed by ortho for her knees. No chest pain or tightness.  No sob.  No acid reflux.  No abdominal pain.  Bowels stable.  Pain - left thumb base.  Persistent.       Past Medical History  Diagnosis Date  . Asthma   . Arthritis   . Depression   . GERD (gastroesophageal reflux disease)   . Hypertension   . Diverticulitis   . Kidney stones   . Migraines   . Hypothyroidism    Past Surgical History  Procedure Laterality Date  . Kidney stones  1980  . Accident  2005  . Foot surgery  2006  . Shoulder adhesion release  2008  . Eye surgery  2010,2011   Family History  Problem Relation Age of Onset  . Arthritis Mother   . Lung cancer Mother   . Mental illness Mother   . Colon cancer Father   . Mental illness Father   . Heart disease Maternal Grandfather   . Stroke Maternal Grandfather   . Breast cancer Paternal Grandmother    Social History   Social History  . Marital Status: Married    Spouse Name: N/A  . Number of Children: N/A  . Years of Education: N/A   Social History Main Topics  . Smoking status: Former Games developer  . Smokeless tobacco: Former Neurosurgeon    Quit date: 12/05/2007  . Alcohol Use: No  . Drug Use: No  . Sexual Activity: Not Asked   Other Topics Concern  . None   Social History Narrative    Outpatient Encounter Prescriptions as of 03/13/2015  Medication Sig  . aspirin 81 MG tablet Take 81 mg by mouth daily.    Marland Kitchen FLUoxetine (PROZAC) 10 MG tablet take 1 tablet by mouth once daily  . levothyroxine (SYNTHROID, LEVOTHROID) 50 MCG tablet Take 1 tablet (50 mcg total) by mouth daily.  . methylcellulose (ARTIFICIAL TEARS) 1 % ophthalmic solution Place 1 drop into both eyes as needed.  . metoprolol (LOPRESSOR) 50 MG tablet Take 1 tablet (50 mg total) by mouth daily as needed.  . ranitidine (ZANTAC) 150 MG capsule Take 150 mg by mouth daily.  . sodium chloride (OCEAN) 0.65 % nasal spray Place 1 spray into the nose as needed.  . timolol (BETIMOL) 0.5 % ophthalmic solution Place 1 drop into the right eye daily.  . VOLTAREN 1 % GEL   . [DISCONTINUED] metoprolol (LOPRESSOR) 50 MG tablet Take 50 mg by mouth daily as needed.   No facility-administered encounter medications on file as of 03/13/2015.    Review of Systems  Constitutional: Negative for appetite change and unexpected weight change.  HENT: Negative for congestion and sinus pressure.   Respiratory: Negative for cough, chest tightness and shortness of breath.   Cardiovascular: Negative for chest pain, palpitations and leg swelling.  Gastrointestinal: Negative for nausea, vomiting, abdominal pain  and diarrhea.  Genitourinary: Negative for dysuria and difficulty urinating.  Musculoskeletal: Positive for back pain.       Pain left thumb base - persistent.    Neurological: Negative for dizziness, light-headedness and headaches.  Psychiatric/Behavioral: Negative for agitation.       Handling stress relatively well.  Does not feel needs any further intervention at this time.         Objective:    Physical Exam  Constitutional: She appears well-developed and well-nourished. No distress.  HENT:  Nose: Nose normal.  Mouth/Throat: Oropharynx is clear and moist.  Eyes: Conjunctivae are normal. Right eye exhibits no discharge. Left eye exhibits no discharge.  Neck: Neck supple. No thyromegaly present.  Cardiovascular: Normal rate and regular rhythm.    Pulmonary/Chest: Breath sounds normal. No respiratory distress. She has no wheezes.  Abdominal: Soft. Bowel sounds are normal. There is no tenderness.  Musculoskeletal: She exhibits no edema or tenderness.  Pain left base of thumb.    Lymphadenopathy:    She has no cervical adenopathy.  Skin: No rash noted. No erythema.  Psychiatric: She has a normal mood and affect. Her behavior is normal.    BP 120/70 mmHg  Pulse 60  Temp(Src) 97.9 F (36.6 C) (Oral)  Resp 18  Ht  (1.676 m)  Wt 178 lb 6 oz (80.91 kg)  BMI 28.80 kg/m2  SpO2 98% Wt Readings from Last 3 Encounters:  03/13/15 178 lb 6 oz (80.91 kg)  09/08/14 174 lb 2 oz (78.983 kg)  12/05/13 173 lb 4 oz (78.586 kg)     Lab Results  Component Value Date   WBC 5.9 10/16/2014   HGB 13.9 10/16/2014   HCT 40.9 10/16/2014   PLT 274.0 10/16/2014   GLUCOSE 95 10/16/2014   CHOL 204* 10/16/2014   TRIG 96.0 10/16/2014   HDL 54.50 10/16/2014   LDLCALC 130* 10/16/2014   ALT 13 10/16/2014   AST 16 10/16/2014   NA 140 10/16/2014   K 4.5 10/16/2014   CL 106 10/16/2014   CREATININE 0.71 10/16/2014   BUN 14 10/16/2014   CO2 29 10/16/2014   TSH 3.06 03/10/2014   INR 1.0 11/07/2012       Assessment & Plan:   Problem List Items Addressed This Visit    GERD (gastroesophageal reflux disease)    On zantac.  EGD 08/2013  -  As outlined in overview.  No upper symptoms now.  Follow.        Hypercholesteremia    Low cholesterol diet and exercise.  Follow lipid panel.        Relevant Medications   metoprolol (LOPRESSOR) 50 MG tablet   Hypothyroidism    On thyroid replacement.  Follow tsh.        Relevant Medications   metoprolol (LOPRESSOR) 50 MG tablet   Pain of left thumb    Persistent pain base of left thumb.  Thumb spica.  Discussed injection.  Will notify me if desires referral.        Right leg pain    Controlled at this time.  Follow.        Spinal stenosis    Seeing orthopedics and has been seeing Dr  Yves Dill.  Saw a spine specialist.  Desires no further pain medication or intervention at this time.  Follow.        Stress    On prozac.  Increased stress as outlined.  Does not feel needs any further intervention.  Follow.  Vertebral compression fracture (HCC)    Evaluated by ortho and spine specialist.  Desires no further intervention.  Follow.  Check bone density.        Relevant Orders   DG Bone Density    Other Visit Diagnoses    Estrogen deficiency    -  Primary    Relevant Orders    DG Bone Density    Screening breast examination        Relevant Orders    MM DIGITAL SCREENING BILATERAL        Dale Fabrica, MD

## 2015-03-15 ENCOUNTER — Encounter: Payer: Self-pay | Admitting: Internal Medicine

## 2015-03-15 DIAGNOSIS — M4850XA Collapsed vertebra, not elsewhere classified, site unspecified, initial encounter for fracture: Secondary | ICD-10-CM | POA: Insufficient documentation

## 2015-03-15 DIAGNOSIS — M79645 Pain in left finger(s): Secondary | ICD-10-CM | POA: Insufficient documentation

## 2015-03-15 NOTE — Assessment & Plan Note (Signed)
Seeing orthopedics and has been seeing Dr Yves Dill.  Saw a spine specialist.  Desires no further pain medication or intervention at this time.  Follow.

## 2015-03-15 NOTE — Assessment & Plan Note (Signed)
Controlled at this time.  Follow.

## 2015-03-15 NOTE — Assessment & Plan Note (Signed)
On thyroid replacement.  Follow tsh.  

## 2015-03-15 NOTE — Assessment & Plan Note (Signed)
On prozac.  Increased stress as outlined.  Does not feel needs any further intervention.  Follow.

## 2015-03-15 NOTE — Assessment & Plan Note (Signed)
Low cholesterol diet and exercise.  Follow lipid panel.   

## 2015-03-15 NOTE — Assessment & Plan Note (Signed)
On zantac.  EGD 08/2013  -  As outlined in overview.  No upper symptoms now.  Follow.

## 2015-03-15 NOTE — Assessment & Plan Note (Signed)
Evaluated by ortho and spine specialist.  Desires no further intervention.  Follow.  Check bone density.

## 2015-03-15 NOTE — Assessment & Plan Note (Signed)
Persistent pain base of left thumb.  Thumb spica.  Discussed injection.  Will notify me if desires referral.

## 2015-03-20 ENCOUNTER — Other Ambulatory Visit: Payer: Self-pay

## 2015-03-20 DIAGNOSIS — Z0000 Encounter for general adult medical examination without abnormal findings: Secondary | ICD-10-CM

## 2015-03-20 LAB — LIPID PANEL
CHOLESTEROL: 188 mg/dL (ref 0–200)
HDL: 62 mg/dL (ref 35–70)
LDL CALC: 112 mg/dL
TRIGLYCERIDES: 68 mg/dL (ref 40–160)

## 2015-03-20 LAB — CBC AND DIFFERENTIAL
HEMATOCRIT: 42 % (ref 36–46)
Hemoglobin: 14.1 g/dL (ref 12.0–16.0)
Platelets: 278 10*3/uL (ref 150–399)

## 2015-03-20 LAB — HEPATIC FUNCTION PANEL: BILIRUBIN, TOTAL: 0.4 mg/dL

## 2015-03-20 LAB — TSH: TSH: 2.16 u[IU]/mL (ref 0.41–5.90)

## 2015-03-20 LAB — BASIC METABOLIC PANEL
Creatinine: 0.7 mg/dL (ref 0.5–1.1)
Glucose: 89 mg/dL

## 2015-03-20 NOTE — Progress Notes (Signed)
Patient came in to have blood drawn per her physician, Dr. Dale Las Ollas at [facility].  Blood was drawn was from the right arm without any incident.  Patient also received a Tdap vaccine.

## 2015-03-21 LAB — CMP12+LP+TP+TSH+6AC+CBC/D/PLT
A/G RATIO: 2.2 (ref 1.1–2.5)
ALK PHOS: 100 IU/L (ref 39–117)
ALT: 18 IU/L (ref 0–32)
AST: 19 IU/L (ref 0–40)
Albumin: 4.4 g/dL (ref 3.6–4.8)
BASOS ABS: 0.1 10*3/uL (ref 0.0–0.2)
BASOS: 1 %
BILIRUBIN TOTAL: 0.4 mg/dL (ref 0.0–1.2)
BUN / CREAT RATIO: 16 (ref 11–26)
BUN: 11 mg/dL (ref 8–27)
CHLORIDE: 104 mmol/L (ref 96–106)
CREATININE: 0.67 mg/dL (ref 0.57–1.00)
Calcium: 8.9 mg/dL (ref 8.7–10.3)
Chol/HDL Ratio: 3 ratio units (ref 0.0–4.4)
Cholesterol, Total: 188 mg/dL (ref 100–199)
EOS (ABSOLUTE): 0.2 10*3/uL (ref 0.0–0.4)
EOS: 3 %
FREE THYROXINE INDEX: 2.5 (ref 1.2–4.9)
GFR calc non Af Amer: 95 mL/min/{1.73_m2} (ref >59)
GFR, EST AFRICAN AMERICAN: 109 mL/min/{1.73_m2} (ref >59)
GGT: 35 IU/L (ref 0–60)
Globulin, Total: 2 g/dL (ref 1.5–4.5)
Glucose: 89 mg/dL (ref 65–99)
HDL: 62 mg/dL (ref >39)
HEMATOCRIT: 41.5 % (ref 34.0–46.6)
HEMOGLOBIN: 14.1 g/dL (ref 11.1–15.9)
IMMATURE GRANULOCYTES: 0 %
Immature Grans (Abs): 0 10*3/uL (ref 0.0–0.1)
Iron: 70 ug/dL (ref 27–139)
LDH: 156 IU/L (ref 119–226)
LDL CALC: 112 mg/dL — AB (ref 0–99)
LYMPHS ABS: 1.8 10*3/uL (ref 0.7–3.1)
Lymphs: 29 %
MCH: 29.5 pg (ref 26.6–33.0)
MCHC: 34 g/dL (ref 31.5–35.7)
MCV: 87 fL (ref 79–97)
MONOCYTES: 4 %
Monocytes Absolute: 0.2 10*3/uL (ref 0.1–0.9)
NEUTROS PCT: 63 %
Neutrophils Absolute: 3.8 10*3/uL (ref 1.4–7.0)
PLATELETS: 278 10*3/uL (ref 150–379)
Phosphorus: 3.6 mg/dL (ref 2.5–4.5)
Potassium: 4.3 mmol/L (ref 3.5–5.2)
RBC: 4.78 x10E6/uL (ref 3.77–5.28)
RDW: 13.9 % (ref 12.3–15.4)
SODIUM: 144 mmol/L (ref 134–144)
T3 Uptake Ratio: 32 % (ref 24–39)
T4, Total: 7.7 ug/dL (ref 4.5–12.0)
TSH: 2.16 u[IU]/mL (ref 0.450–4.500)
Total Protein: 6.4 g/dL (ref 6.0–8.5)
Triglycerides: 68 mg/dL (ref 0–149)
URIC ACID: 4.4 mg/dL (ref 2.5–7.1)
VLDL CHOLESTEROL CAL: 14 mg/dL (ref 5–40)
WBC: 6 10*3/uL (ref 3.4–10.8)

## 2015-03-21 LAB — HEPATITIS C ANTIBODY (REFLEX): HCV Ab: <0.1 s/co ratio (ref 0.0–0.9)

## 2015-03-21 LAB — HCV COMMENT:

## 2015-03-23 ENCOUNTER — Encounter: Payer: Self-pay | Admitting: Internal Medicine

## 2015-03-23 ENCOUNTER — Telehealth: Payer: Self-pay | Admitting: Internal Medicine

## 2015-03-23 NOTE — Telephone Encounter (Signed)
Pt notified of lab results via mychart. 

## 2015-03-26 ENCOUNTER — Telehealth: Payer: Self-pay

## 2015-03-26 ENCOUNTER — Ambulatory Visit
Admission: RE | Admit: 2015-03-26 | Discharge: 2015-03-26 | Disposition: A | Discharge status: Home / Self Care | Payer: Managed Care, Other (non HMO) | Source: Ambulatory Visit | Attending: Internal Medicine | Admitting: Internal Medicine

## 2015-03-26 DIAGNOSIS — Z13820 Encounter for screening for osteoporosis: Secondary | ICD-10-CM | POA: Diagnosis present

## 2015-03-26 DIAGNOSIS — Z780 Asymptomatic menopausal state: Secondary | ICD-10-CM | POA: Insufficient documentation

## 2015-03-26 DIAGNOSIS — E2839 Other primary ovarian failure: Secondary | ICD-10-CM

## 2015-03-26 DIAGNOSIS — Z1231 Encounter for screening mammogram for malignant neoplasm of breast: Secondary | ICD-10-CM | POA: Diagnosis present

## 2015-03-26 DIAGNOSIS — M810 Age-related osteoporosis without current pathological fracture: Secondary | ICD-10-CM | POA: Insufficient documentation

## 2015-03-26 DIAGNOSIS — M4850XD Collapsed vertebra, not elsewhere classified, site unspecified, subsequent encounter for fracture with routine healing: Secondary | ICD-10-CM

## 2015-03-26 DIAGNOSIS — Z1239 Encounter for other screening for malignant neoplasm of breast: Secondary | ICD-10-CM

## 2015-03-26 DIAGNOSIS — IMO0001 Reserved for inherently not codable concepts without codable children: Secondary | ICD-10-CM

## 2015-03-26 NOTE — Telephone Encounter (Signed)
Pt called wanting to know what her test results were. I notified the pt of her test results, pt verbalized understanding.

## 2015-03-26 NOTE — Progress Notes (Signed)
Lab results were sent to Dr. Verna Czech per patient's request.

## 2015-03-27 ENCOUNTER — Telehealth: Payer: Self-pay | Admitting: Internal Medicine

## 2015-03-27 NOTE — Telephone Encounter (Signed)
Called and lm for pt to called back an reschedule her 06/15/15 appt  to 04/22/15 at 10:30 time is blocked with pt name.. Please schedule when pt calls back.. thanks

## 2015-03-27 NOTE — Telephone Encounter (Signed)
Pt called back and scheduled appt

## 2015-03-29 ENCOUNTER — Other Ambulatory Visit: Payer: Self-pay | Admitting: Internal Medicine

## 2015-04-22 ENCOUNTER — Encounter: Payer: Self-pay | Admitting: Internal Medicine

## 2015-04-22 ENCOUNTER — Ambulatory Visit (INDEPENDENT_AMBULATORY_CARE_PROVIDER_SITE_OTHER): Payer: Managed Care, Other (non HMO) | Admitting: Internal Medicine

## 2015-04-22 VITALS — BP 110/80 | HR 49 | Temp 97.6°F | Resp 17 | Ht 66.0 in | Wt 178.5 lb

## 2015-04-22 DIAGNOSIS — M810 Age-related osteoporosis without current pathological fracture: Secondary | ICD-10-CM | POA: Diagnosis not present

## 2015-04-22 NOTE — Progress Notes (Signed)
Sydney ID: Sydney Anderson, female   DOB: 11-May-1950, 65 y.o.   MRN: [id number]   Subjective:    Sydney Anderson, female    DOB: 11-May-1950, 65 y.o.   MRN: [id number]  HPI  Sydney here to discuss her bone density.  Her Tscore lumbar spine - -3.2.  Discussed her results and discussed treatment options.  Discussed side effects and possible risks of medications.     Past Medical History  Diagnosis Date  . Asthma   . Arthritis   . Depression   . GERD (gastroesophageal reflux disease)   . Hypertension   . Diverticulitis   . Kidney stones   . Migraines   . Hypothyroidism    Past Surgical History  Procedure Laterality Date  . Kidney stones  1980  . Accident  2005  . Foot surgery  2006  . Shoulder adhesion release  2008  . Eye surgery  2010,2011   Family History  Problem Relation Age of Onset  . Arthritis Mother   . Lung cancer Mother   . Mental illness Mother   . Colon cancer Father   . Mental illness Father   . Heart disease Maternal Grandfather   . Stroke Maternal Grandfather   . Breast cancer Paternal Grandmother    Social History   Social History  . Marital Status: Married    Spouse Name: N/A  . Number of Children: N/A  . Years of Education: N/A   Social History Main Topics  . Smoking status: Former Games developermoker  . Smokeless tobacco: Former NeurosurgeonUser    Quit date: 12/05/2007  . Alcohol Use: No  . Drug Use: No  . Sexual Activity: Not Asked   Other Topics Concern  . None   Social History Narrative    Outpatient Encounter Prescriptions as of 04/22/2015  Medication Sig  . aspirin 81 MG tablet Take 81 mg by mouth daily.  . Calcium Citrate-Vitamin D (CALCIUM + D PO) Take by mouth.  Marland Kitchen. FLUoxetine (PROZAC) 10 MG tablet take 1 tablet by mouth once daily  . Glucosamine HCl (GLUCOSAMINE PO) Take by mouth.  . levothyroxine (SYNTHROID, LEVOTHROID) 50 MCG tablet take 1 tablet by mouth once daily  . MAGNESIUM PO Take by mouth.  . Menaquinone-7 (VITAMIN  K2 PO) Take by mouth.  . methylcellulose (ARTIFICIAL TEARS) 1 % ophthalmic solution Place 1 drop into both eyes as needed.  . metoprolol (LOPRESSOR) 50 MG tablet Take 1 tablet (50 mg total) by mouth daily as needed.  . ranitidine (ZANTAC) 150 MG capsule Take 150 mg by mouth daily.  . sodium chloride (OCEAN) 0.65 % nasal spray Place 1 spray into the nose as needed.  . timolol (BETIMOL) 0.5 % ophthalmic solution Place 1 drop into the right eye daily.  . VOLTAREN 1 % GEL    No facility-administered encounter medications on file as of 04/22/2015.    Review of Systems  Constitutional: Negative for appetite change and unexpected weight change.  Respiratory: Negative for cough and shortness of breath.   Gastrointestinal: Negative for nausea, vomiting and diarrhea.  Psychiatric/Behavioral: Negative for dysphoric mood and agitation.       Objective:    Physical Exam  Constitutional: She appears well-developed and well-nourished. No distress.  did not perform physical exam secondary to nature of visit.     BP 110/80 mmHg  Pulse 49  Temp(Src) 97.6 F (36.4 C) (Oral)  Resp 17  Ht 5\' 6"  (1.676 m)  Wt 178 lb 8  oz (80.967 kg)  BMI 28.82 kg/m2  SpO2 98% Wt Readings from Last 3 Encounters:  04/22/15 178 lb 8 oz (80.967 kg)  03/13/15 178 lb 6 oz (80.91 kg)  09/08/14 174 lb 2 oz (78.983 kg)     Lab Results  Component Value Date   WBC 6.0 03/20/2015   HGB 14.1 03/20/2015   HCT 41.5 03/20/2015   PLT 278 03/20/2015   GLUCOSE 89 03/20/2015   CHOL 188 03/20/2015   TRIG 68 03/20/2015   HDL 62 03/20/2015   LDLCALC 112* 03/20/2015   ALT 18 03/20/2015   AST 19 03/20/2015   NA 144 03/20/2015   K 4.3 03/20/2015   CL 104 03/20/2015   CREATININE 0.67 03/20/2015   BUN 11 03/20/2015   CO2 29 10/16/2014   TSH 2.160 03/20/2015   INR 1.0 11/07/2012    Dg Bone Density  03/26/2015  EXAM: DUAL X-RAY ABSORPTIOMETRY (DXA) FOR BONE MINERAL DENSITY IMPRESSION: Dear Dr. Dale Madera, Your  Sydney Nichol Ator completed a BMD test on 03/26/2015 using the Lunar iDXA DXA System (analysis version: 14.10) manufactured by [employer]. The following summarizes the results of our evaluation. Sydney BIOGRAPHICAL: Name: Sydney Anderson, Sydney Anderson Sydney ID: [id number] Birth Date: 1950-04-08 Height: 66.0 in. Gender: Female Exam Date: 03/26/2015 Weight: 175.2 lbs. Indications: Caucasian, Family Hx of Osteoporosis, History of Compression Fracture, Hypothyroid, Postmenopausal, right hip replacement Fractures: Lumbar Spine Treatments: Synthroid ASSESSMENT: The BMD measured at AP Spine L2-L4 is 0.812 g/cm2 with a T-score of -3.2. This Sydney is considered osteoporotic according to World Health Organization [facility]) criteria. L1 was excluded due to degenerative changes. Right femur was excluded due to surgical hardware. Site       Region Measured   Measured WHO            Young Adult BMD Date       Age      Classification T-score AP Spine L2-L4 03/26/2015 62.9 Osteoporosis -3.2 0.812 g/cm2 Left Femur Total 03/26/2015 62.9 Osteopenia -2.1 0.741 g/cm2 World Health Organization [facility]) criteria for post-menopausal, Caucasian Women: Normal:       T-score at or above -1 SD Osteopenia:   T-score between -1 and -2.5 SD Osteoporosis: T-score at or below -2.5 SD RECOMMENDATIONS: National Osteoporosis Foundation recommends that FDA-approved medical therapies be considered in postmenopausal women and men age 63 or older with a: 1. Hip or vertebral (clinical or morphometric) fracture. 2. T-score of < -2.5 at the spine or hip. 3. Ten-year fracture probability by FRAX of 3% or greater for hip fracture or 20% or greater for major osteoporotic fracture. All treatment decisions require clinical judgment and consideration of individual Sydney factors, including Sydney preferences, co-morbidities, previous drug use, risk factors not captured in the FRAX model (e.g. falls, vitamin D deficiency, increased bone turnover, interval significant  decline in bone density) and possible under - or over-estimation of fracture risk by FRAX. All patients should ensure an adequate intake of dietary calcium (1200 mg/d) and vitamin D (800 IU daily) unless contraindicated. FOLLOW-UP: People with diagnosed cases of osteoporosis or at high risk for fracture should have regular bone mineral density tests. For patients eligible for Medicare, routine testing is allowed once every 2 years. The testing frequency can be increased to one year for patients who have rapidly progressing disease, those who are receiving or discontinuing medical therapy to restore bone mass, or have additional risk factors. I have reviewed this report, and agree with the above findings. [facility] Electronically Signed  By: Bretta Bang III M.D.   On: 03/26/2015 09:05   Mm Digital Screening Bilateral  03/26/2015  CLINICAL DATA:  Screening. EXAM: DIGITAL SCREENING BILATERAL MAMMOGRAM WITH CAD COMPARISON:  Previous exam(s). ACR Breast Density Category b: There are scattered areas of fibroglandular density. FINDINGS: There are no findings suspicious for malignancy. Images were processed with CAD. IMPRESSION: No mammographic evidence of malignancy. A result letter of this screening mammogram will be mailed directly to the Sydney. RECOMMENDATION: Screening mammogram in one year. (Code:SM-B-01Y) BI-RADS CATEGORY  1: Negative. Electronically Signed   By: Dalphine Handing M.D.   On: 03/26/2015 09:42       Assessment & Plan:   Problem List Items Addressed This Visit    Osteoporosis - Primary    Discussed results of bone density and treatment options.  Discussed possible risks and side effects.  She prefers to hold on any prescription medication at this time.  Will continue weight bearing exercise as tolerated. Will continue calcium and vitamin D.  Follow.        Relevant Medications   Calcium Citrate-Vitamin D (CALCIUM + D PO)   Menaquinone-7 (VITAMIN K2 PO)      I spent 15  minutes with the Sydney and more than 50% of the time was spent in consultation regarding the above.    Dale Manchester, MD

## 2015-04-22 NOTE — Progress Notes (Signed)
Pre-visit discussion using our clinic review tool. No additional management support is needed unless otherwise documented below in the visit note.  

## 2015-04-30 ENCOUNTER — Other Ambulatory Visit: Payer: Self-pay | Admitting: Internal Medicine

## 2015-05-03 ENCOUNTER — Encounter: Payer: Self-pay | Admitting: Internal Medicine

## 2015-05-03 DIAGNOSIS — M810 Age-related osteoporosis without current pathological fracture: Secondary | ICD-10-CM | POA: Insufficient documentation

## 2015-05-03 NOTE — Assessment & Plan Note (Signed)
Discussed results of bone density and treatment options.  Discussed possible risks and side effects.  She prefers to hold on any prescription medication at this time.  Will continue weight bearing exercise as tolerated. Will continue calcium and vitamin D.  Follow.

## 2015-06-15 ENCOUNTER — Ambulatory Visit: Payer: Self-pay | Admitting: Internal Medicine

## 2015-09-10 ENCOUNTER — Ambulatory Visit (INDEPENDENT_AMBULATORY_CARE_PROVIDER_SITE_OTHER): Payer: Managed Care, Other (non HMO) | Admitting: Internal Medicine

## 2015-09-10 ENCOUNTER — Encounter: Payer: Self-pay | Admitting: Internal Medicine

## 2015-09-10 VITALS — BP 110/80 | HR 64 | Temp 98.1°F | Resp 18 | Ht 66.0 in | Wt 181.1 lb

## 2015-09-10 DIAGNOSIS — Z0000 Encounter for general adult medical examination without abnormal findings: Secondary | ICD-10-CM | POA: Diagnosis not present

## 2015-09-10 DIAGNOSIS — E039 Hypothyroidism, unspecified: Secondary | ICD-10-CM

## 2015-09-10 DIAGNOSIS — M4850XD Collapsed vertebra, not elsewhere classified, site unspecified, subsequent encounter for fracture with routine healing: Secondary | ICD-10-CM

## 2015-09-10 DIAGNOSIS — M810 Age-related osteoporosis without current pathological fracture: Secondary | ICD-10-CM

## 2015-09-10 DIAGNOSIS — M4800 Spinal stenosis, site unspecified: Secondary | ICD-10-CM | POA: Diagnosis not present

## 2015-09-10 DIAGNOSIS — M25551 Pain in right hip: Secondary | ICD-10-CM

## 2015-09-10 DIAGNOSIS — Z658 Other specified problems related to psychosocial circumstances: Secondary | ICD-10-CM

## 2015-09-10 DIAGNOSIS — IMO0001 Reserved for inherently not codable concepts without codable children: Secondary | ICD-10-CM

## 2015-09-10 DIAGNOSIS — E7800 Pure hypercholesterolemia, unspecified: Secondary | ICD-10-CM

## 2015-09-10 DIAGNOSIS — K219 Gastro-esophageal reflux disease without esophagitis: Secondary | ICD-10-CM

## 2015-09-10 DIAGNOSIS — F439 Reaction to severe stress, unspecified: Secondary | ICD-10-CM

## 2015-09-10 NOTE — Progress Notes (Signed)
Pre-visit discussion using our clinic review tool. No additional management support is needed unless otherwise documented below in the visit note.  

## 2015-09-10 NOTE — Progress Notes (Signed)
Patient ID: Audi Conover, female   DOB: 1951/01/24, 65 y.o.   MRN: [id number]   Subjective:    Patient ID: Ravneet Spilker, female    DOB: July 18, 1950, 65 y.o.   MRN: [id number]  HPI  Patient here for her physical exam.  Increased stress with her husband's health issues.  She also reports she is worried about "what is going on in the world".  Has good support from her husband.  Can talk with him.  Feels the prozac is helping.  Does not feel she needs anything more at this time.  No chest pain.  Breathing stable.  No acid reflux.  No abdominal pain or cramping.  Bowels stable.     Past Medical History:  Diagnosis Date  . Arthritis   . Asthma   . Depression   . Diverticulitis   . GERD (gastroesophageal reflux disease)   . Hypertension   . Hypothyroidism   . Kidney stones   . Migraines    Past Surgical History:  Procedure Laterality Date  . accident  2005  . EYE SURGERY  2010,2011  . FOOT SURGERY  2006  . kidney stones  1980  . SHOULDER ADHESION RELEASE  2008   Family History  Problem Relation Age of Onset  . Arthritis Mother   . Lung cancer Mother   . Mental illness Mother   . Colon cancer Father   . Mental illness Father   . Heart disease Maternal Grandfather   . Stroke Maternal Grandfather   . Breast cancer Paternal Grandmother    Social History   Social History  . Marital status: Married    Spouse name: N/A  . Number of children: N/A  . Years of education: N/A   Social History Main Topics  . Smoking status: Former Games developer  . Smokeless tobacco: Former Neurosurgeon    Quit date: 12/05/2007  . Alcohol use No  . Drug use: No  . Sexual activity: Not Asked   Other Topics Concern  . None   Social History Narrative  . None    Outpatient Encounter Prescriptions as of 09/10/2015  Medication Sig  . aspirin 81 MG tablet Take 81 mg by mouth daily.  . Calcium Citrate-Vitamin D (CALCIUM + D PO) Take by mouth.  Marland Kitchen FLUoxetine (PROZAC) 10 MG tablet take 1 tablet by  mouth once daily  . FLUoxetine (PROZAC) 10 MG tablet take 1 tablet by mouth once daily  . levothyroxine (SYNTHROID, LEVOTHROID) 50 MCG tablet take 1 tablet by mouth once daily  . MAGNESIUM PO Take by mouth.  . Menaquinone-7 (VITAMIN K2 PO) Take by mouth.  . methylcellulose (ARTIFICIAL TEARS) 1 % ophthalmic solution Place 1 drop into both eyes as needed.  . metoprolol (LOPRESSOR) 50 MG tablet Take 1 tablet (50 mg total) by mouth daily as needed.  . ranitidine (ZANTAC) 150 MG capsule Take 150 mg by mouth daily as needed.   . sodium chloride (OCEAN) 0.65 % nasal spray Place 1 spray into the nose as needed.  . timolol (BETIMOL) 0.5 % ophthalmic solution Place 2 drops into the right eye daily.   . VOLTAREN 1 % GEL   . [DISCONTINUED] Glucosamine HCl (GLUCOSAMINE PO) Take by mouth.   No facility-administered encounter medications on file as of 09/10/2015.     Review of Systems  Constitutional: Negative for appetite change and unexpected weight change.  HENT: Negative for congestion and sinus pressure.   Eyes: Negative for pain and discharge.  Respiratory: Negative  for cough, chest tightness and shortness of breath.   Cardiovascular: Negative for chest pain, palpitations and leg swelling.  Gastrointestinal: Negative for abdominal pain, diarrhea, nausea and vomiting.  Genitourinary: Negative for difficulty urinating and dysuria.  Musculoskeletal: Negative for joint swelling and myalgias.       Chronic pain.  Limits her activity.    Skin: Negative for color change and rash.  Neurological: Negative for dizziness, light-headedness and headaches.  Hematological: Negative for adenopathy. Does not bruise/bleed easily.  Psychiatric/Behavioral: Negative for agitation and dysphoric mood.       Increased stress as outlined.        Objective:    Physical Exam  Constitutional: She is oriented to person, place, and time. She appears well-developed and well-nourished. No distress.  HENT:  Nose: Nose  normal.  Mouth/Throat: Oropharynx is clear and moist.  Eyes: Right eye exhibits no discharge. Left eye exhibits no discharge. No scleral icterus.  Neck: Neck supple. No thyromegaly present.  Cardiovascular: Normal rate and regular rhythm.   Pulmonary/Chest: Breath sounds normal. No accessory muscle usage. No tachypnea. No respiratory distress. She has no decreased breath sounds. She has no wheezes. She has no rhonchi. Right breast exhibits no inverted nipple, no mass, no nipple discharge and no tenderness (no axillary adenopathy). Left breast exhibits no inverted nipple, no mass, no nipple discharge and no tenderness (no axilarry adenopathy).  Abdominal: Soft. Bowel sounds are normal. There is no tenderness.  Musculoskeletal: She exhibits no edema or tenderness.  Lymphadenopathy:    She has no cervical adenopathy.  Neurological: She is alert and oriented to person, place, and time.  Skin: Skin is warm. No rash noted. No erythema.  Psychiatric: She has a normal mood and affect. Her behavior is normal.    BP 110/80 (BP Location: Left Arm, Patient Position: Sitting, Cuff Size: Large)   Pulse 64   Temp 98.1 F (36.7 C) (Oral)   Resp 18   Ht  (1.676 m)   Wt 181 lb 2 oz (82.2 kg)   SpO2 97%   BMI 29.23 kg/m  Wt Readings from Last 3 Encounters:  09/10/15 181 lb 2 oz (82.2 kg)  04/22/15 178 lb 8 oz (81 kg)  03/13/15 178 lb 6 oz (80.9 kg)     Lab Results  Component Value Date   WBC 6.0 03/20/2015   HGB 14.1 03/20/2015   HCT 41.5 03/20/2015   PLT 278 03/20/2015   GLUCOSE 89 03/20/2015   CHOL 188 03/20/2015   TRIG 68 03/20/2015   HDL 62 03/20/2015   LDLCALC 112 (H) 03/20/2015   ALT 18 03/20/2015   AST 19 03/20/2015   NA 144 03/20/2015   K 4.3 03/20/2015   CL 104 03/20/2015   CREATININE 0.67 03/20/2015   BUN 11 03/20/2015   CO2 29 10/16/2014   TSH 2.160 03/20/2015   INR 1.0 11/07/2012    Dg Bone Density  Result Date: 03/26/2015 EXAM: DUAL X-RAY ABSORPTIOMETRY (DXA)  FOR BONE MINERAL DENSITY IMPRESSION: Dear Dr. Dale Weston Lakes, Your patient Leeloo Silverthorne completed a BMD test on 03/26/2015 using the Lunar iDXA DXA System (analysis version: 14.10) manufactured by [employer]. The following summarizes the results of our evaluation. PATIENT BIOGRAPHICAL: Name: Terrianna, [place]claw Patient ID: [id number] Birth Date: 1950/04/29 Height: 66.0 in. Gender: Female Exam Date: 03/26/2015 Weight: 175.2 lbs. Indications: Caucasian, Family Hx of Osteoporosis, History of Compression Fracture, Hypothyroid, Postmenopausal, right hip replacement Fractures: Lumbar Spine Treatments: Synthroid ASSESSMENT: The BMD measured at AP  Spine L2-L4 is 0.812 g/cm2 with a T-score of -3.2. This patient is considered osteoporotic according to World Health Organization [facility]) criteria. L1 was excluded due to degenerative changes. Right femur was excluded due to surgical hardware. Site       Region Measured   Measured WHO            Young Adult BMD Date       Age      Classification T-score AP Spine L2-L4 03/26/2015 62.9 Osteoporosis -3.2 0.812 g/cm2 Left Femur Total 03/26/2015 62.9 Osteopenia -2.1 0.741 g/cm2 World Health Organization [facility]) criteria for post-menopausal, Caucasian Women: Normal:       T-score at or above -1 SD Osteopenia:   T-score between -1 and -2.5 SD Osteoporosis: T-score at or below -2.5 SD RECOMMENDATIONS: National Osteoporosis Foundation recommends that FDA-approved medical therapies be considered in postmenopausal women and men age 61 or older with a: 1. Hip or vertebral (clinical or morphometric) fracture. 2. T-score of < -2.5 at the spine or hip. 3. Ten-year fracture probability by FRAX of 3% or greater for hip fracture or 20% or greater for major osteoporotic fracture. All treatment decisions require clinical judgment and consideration of individual patient factors, including patient preferences, co-morbidities, previous drug use, risk factors not captured in the FRAX model (e.g. falls,  vitamin D deficiency, increased bone turnover, interval significant decline in bone density) and possible under - or over-estimation of fracture risk by FRAX. All patients should ensure an adequate intake of dietary calcium (1200 mg/d) and vitamin D (800 IU daily) unless contraindicated. FOLLOW-UP: People with diagnosed cases of osteoporosis or at high risk for fracture should have regular bone mineral density tests. For patients eligible for Medicare, routine testing is allowed once every 2 years. The testing frequency can be increased to one year for patients who have rapidly progressing disease, those who are receiving or discontinuing medical therapy to restore bone mass, or have additional risk factors. I have reviewed this report, and agree with the above findings. [facility] Radiology Electronically Signed   By: Bretta Bang III M.D.   On: 03/26/2015 09:05   Mm Digital Screening Bilateral  Result Date: 03/26/2015 CLINICAL DATA:  Screening. EXAM: DIGITAL SCREENING BILATERAL MAMMOGRAM WITH CAD COMPARISON:  Previous exam(s). ACR Breast Density Category b: There are scattered areas of fibroglandular density. FINDINGS: There are no findings suspicious for malignancy. Images were processed with CAD. IMPRESSION: No mammographic evidence of malignancy. A result letter of this screening mammogram will be mailed directly to the patient. RECOMMENDATION: Screening mammogram in one year. (Code:SM-B-01Y) BI-RADS CATEGORY  1: Negative. Electronically Signed   By: Dalphine Handing M.D.   On: 03/26/2015 09:42       Assessment & Plan:   Problem List Items Addressed This Visit    None    Visit Diagnoses   None.      Dale Cordes Lakes, MD

## 2015-09-11 ENCOUNTER — Encounter: Payer: Self-pay | Admitting: Internal Medicine

## 2015-09-11 NOTE — Assessment & Plan Note (Signed)
Discussed bone density with her last visit.  Discussed treatment options. She declines rx medication.  Continue weight bearing exercise.  Follow.

## 2015-09-11 NOTE — Assessment & Plan Note (Signed)
Increased stress as outlined.  On prozac.  Desires no further intervention.  Follow.

## 2015-09-11 NOTE — Assessment & Plan Note (Signed)
S/p hip surgery.  Has done well.  Followed by Dr Ernest Pine.

## 2015-09-11 NOTE — Assessment & Plan Note (Signed)
Low cholesterol diet and exercise.  Follow lipid panel.   Lab Results  Component Value Date   CHOL 188 03/20/2015   HDL 62 03/20/2015   LDLCALC 112 (H) 03/20/2015   TRIG 68 03/20/2015   CHOLHDL 3.0 03/20/2015

## 2015-09-11 NOTE — Assessment & Plan Note (Signed)
Has been followed by ortho and spine specialist.  Bone density as outlined.  Declines further treatment at this time.

## 2015-09-11 NOTE — Assessment & Plan Note (Signed)
EGD 08/26/13 as outlined.  Symptoms controlled on current regimen.

## 2015-09-11 NOTE — Assessment & Plan Note (Signed)
Physical today 09/10/15.  PAP 09/08/14 - negative with negative HPV.  Mammogram 03/26/15 - Birads I.  Colonoscopy 04/16/10.

## 2015-09-11 NOTE — Assessment & Plan Note (Signed)
Has seen ortho and Dr Yves Dill.  Saw a spine specialist.  Stable.  Desires no further intervention at this time.

## 2015-09-11 NOTE — Assessment & Plan Note (Signed)
On thyroid replacement.  Follow tsh.  

## 2015-09-30 ENCOUNTER — Other Ambulatory Visit: Payer: Self-pay | Admitting: Internal Medicine

## 2016-01-11 ENCOUNTER — Ambulatory Visit: Payer: Self-pay | Admitting: Internal Medicine

## 2016-02-26 ENCOUNTER — Other Ambulatory Visit: Payer: Self-pay | Admitting: Internal Medicine

## 2016-02-26 NOTE — Telephone Encounter (Signed)
Last refill on Prozac was on 01/27/16 and last OV was 04/22/15 and last labs were 03/20/15. Pt has no future scheduled appts at this time. Ok to refill or does pt need to be seen?

## 2016-02-28 NOTE — Telephone Encounter (Signed)
I have ok'd refill for prozac #30 with one refill.  Needs a f/u appt. Scheduled.  Thanks

## 2016-03-04 NOTE — Telephone Encounter (Signed)
Left message on voicemail to call to schedule appointment

## 2016-03-25 ENCOUNTER — Other Ambulatory Visit: Payer: Self-pay | Admitting: Internal Medicine

## 2016-04-24 ENCOUNTER — Other Ambulatory Visit: Payer: Self-pay | Admitting: Internal Medicine

## 2016-04-25 NOTE — Telephone Encounter (Signed)
Last OV 09/10/15 please advise to refill 02/28/16?

## 2016-04-26 NOTE — Telephone Encounter (Signed)
Please schedule f/u appt and then ok to refill until appt.  Thanks

## 2016-04-28 NOTE — Telephone Encounter (Signed)
Left detailed message patient needs to schedule an appointment.

## 2016-05-09 ENCOUNTER — Other Ambulatory Visit: Payer: Self-pay

## 2016-05-09 MED ORDER — FLUOXETINE HCL 10 MG PO TABS: 10.0000 mg | ORAL_TABLET | Freq: Every day | ORAL | 1 refills | Status: DC

## 2016-05-11 ENCOUNTER — Encounter: Payer: Self-pay | Admitting: Internal Medicine

## 2016-05-11 ENCOUNTER — Ambulatory Visit (INDEPENDENT_AMBULATORY_CARE_PROVIDER_SITE_OTHER): Payer: Managed Care, Other (non HMO) | Admitting: Internal Medicine

## 2016-05-11 VITALS — BP 110/78 | HR 60 | Temp 98.8°F | Resp 12 | Ht 66.0 in | Wt 188.8 lb

## 2016-05-11 DIAGNOSIS — E039 Hypothyroidism, unspecified: Secondary | ICD-10-CM | POA: Diagnosis not present

## 2016-05-11 DIAGNOSIS — E7800 Pure hypercholesterolemia, unspecified: Secondary | ICD-10-CM | POA: Diagnosis not present

## 2016-05-11 DIAGNOSIS — F439 Reaction to severe stress, unspecified: Secondary | ICD-10-CM | POA: Diagnosis not present

## 2016-05-11 DIAGNOSIS — M810 Age-related osteoporosis without current pathological fracture: Secondary | ICD-10-CM

## 2016-05-11 DIAGNOSIS — Z1231 Encounter for screening mammogram for malignant neoplasm of breast: Secondary | ICD-10-CM

## 2016-05-11 DIAGNOSIS — Z1239 Encounter for other screening for malignant neoplasm of breast: Secondary | ICD-10-CM

## 2016-05-11 DIAGNOSIS — M4800 Spinal stenosis, site unspecified: Secondary | ICD-10-CM | POA: Diagnosis not present

## 2016-05-11 DIAGNOSIS — M79604 Pain in right leg: Secondary | ICD-10-CM

## 2016-05-11 MED ORDER — FLUOXETINE HCL 10 MG PO TABS: 10.0000 mg | ORAL_TABLET | Freq: Every day | ORAL | 5 refills | Status: DC

## 2016-05-11 NOTE — Progress Notes (Signed)
Pre-visit discussion using our clinic review tool. No additional management support is needed unless otherwise documented below in the visit note.  

## 2016-05-11 NOTE — Progress Notes (Signed)
Patient ID: Sydney Anderson, female   DOB: [birth year], 66 y.o.   MRN: [id number]   Subjective:    Patient ID: Sydney Anderson, female    DOB: 02/16/1950, 66 y.o.   MRN: [id number]  HPI  Patient here for a scheduled follow up.  She reports increased stress with her husband's health issues and their financial situation.  She is working and trying to do stuff around the house.  Discussed with her today.  She does not feel needs anything more at this time.  On prozac.  No chest pain.  No sob.  No acid reflux.  She is trying to exercise.  Riding her bike.  Having increased pain with her back and right leg.  Knees and joints ache.  Limits her activity.     Past Medical History:  Diagnosis Date  . Arthritis   . Asthma   . Depression   . Diverticulitis   . GERD (gastroesophageal reflux disease)   . Hypertension   . Hypothyroidism   . Kidney stones   . Migraines    Past Surgical History:  Procedure Laterality Date  . accident  2005  . EYE SURGERY  2010,2011  . FOOT SURGERY  2006  . kidney stones  1980  . SHOULDER ADHESION RELEASE  2008   Family History  Problem Relation Age of Onset  . Arthritis Mother   . Lung cancer Mother   . Mental illness Mother   . Colon cancer Father   . Mental illness Father   . Heart disease Maternal Grandfather   . Stroke Maternal Grandfather   . Breast cancer Paternal Grandmother    Social History   Social History  . Marital status: Married    Spouse name: N/A  . Number of children: N/A  . Years of education: N/A   Social History Main Topics  . Smoking status: Former Games developer  . Smokeless tobacco: Former Neurosurgeon    Quit date: 12/05/2007  . Alcohol use No  . Drug use: No  . Sexual activity: Not Asked   Other Topics Concern  . None   Social History Narrative  . None    Outpatient Encounter Prescriptions as of 05/11/2016  Medication Sig  . aspirin 81 MG tablet Take 81 mg by mouth daily.  . Calcium Citrate-Vitamin D (CALCIUM + D PO)  Take by mouth.  Marland Kitchen FLUoxetine (PROZAC) 10 MG tablet Take 1 tablet (10 mg total) by mouth daily.  Marland Kitchen levothyroxine (SYNTHROID, LEVOTHROID) 50 MCG tablet take 1 tablet by mouth once daily  . MAGNESIUM PO Take by mouth.  . Menaquinone-7 (VITAMIN K2 PO) Take by mouth.  . methylcellulose (ARTIFICIAL TEARS) 1 % ophthalmic solution Place 1 drop into both eyes as needed.  . metoprolol (LOPRESSOR) 50 MG tablet Take 1 tablet (50 mg total) by mouth daily as needed.  . ranitidine (ZANTAC) 150 MG capsule Take 150 mg by mouth daily as needed.   . sodium chloride (OCEAN) 0.65 % nasal spray Place 1 spray into the nose as needed.  . timolol (BETIMOL) 0.5 % ophthalmic solution Place 2 drops into the right eye daily.   . VOLTAREN 1 % GEL   . [DISCONTINUED] FLUoxetine (PROZAC) 10 MG tablet Take 1 tablet (10 mg total) by mouth daily.   No facility-administered encounter medications on file as of 05/11/2016.     Review of Systems  Constitutional: Negative for appetite change and unexpected weight change.  HENT: Negative for congestion and sinus pressure.  Respiratory: Negative for cough, chest tightness and shortness of breath.   Cardiovascular: Negative for chest pain, palpitations and leg swelling.  Gastrointestinal: Negative for abdominal pain, diarrhea, nausea and vomiting.  Genitourinary: Negative for difficulty urinating and dysuria.  Musculoskeletal: Positive for back pain.       Knees and joint pain.  Right leg pain.    Skin: Negative for color change and rash.  Neurological: Negative for dizziness, light-headedness and headaches.  Psychiatric/Behavioral: Negative for agitation and dysphoric mood.       Objective:     Blood pressure rechecked by me:  120/78  Physical Exam  Constitutional: She appears well-developed and well-nourished. No distress.  HENT:  Nose: Nose normal.  Mouth/Throat: Oropharynx is clear and moist.  Neck: Neck supple. No thyromegaly present.  Cardiovascular: Normal rate  and regular rhythm.   Pulmonary/Chest: Breath sounds normal. No respiratory distress. She has no wheezes.  Abdominal: Soft. Bowel sounds are normal. There is no tenderness.  Musculoskeletal: She exhibits no edema or tenderness.  Lymphadenopathy:    She has no cervical adenopathy.  Skin: No rash noted. No erythema.  Psychiatric: She has a normal mood and affect. Her behavior is normal.    BP 110/78 (BP Location: Left Arm, Patient Position: Sitting, Cuff Size: Large)   Pulse 60   Temp 98.8 F (37.1 C) (Oral)   Resp 12   Ht  (1.676 m)   Wt 188 lb 12.8 oz (85.6 kg)   SpO2 97%   BMI 30.47 kg/m  Wt Readings from Last 3 Encounters:  05/11/16 188 lb 12.8 oz (85.6 kg)  09/10/15 181 lb 2 oz (82.2 kg)  04/22/15 178 lb 8 oz (81 kg)     Lab Results  Component Value Date   WBC 6.0 03/20/2015   HGB 14.1 03/20/2015   HCT 41.5 03/20/2015   PLT 278 03/20/2015   GLUCOSE 89 03/20/2015   CHOL 188 03/20/2015   TRIG 68 03/20/2015   HDL 62 03/20/2015   LDLCALC 112 (H) 03/20/2015   ALT 18 03/20/2015   AST 19 03/20/2015   NA 144 03/20/2015   K 4.3 03/20/2015   CL 104 03/20/2015   CREATININE 0.67 03/20/2015   BUN 11 03/20/2015   CO2 29 10/16/2014   TSH 2.160 03/20/2015   INR 1.0 11/07/2012    Dg Bone Density  Result Date: 03/26/2015 EXAM: DUAL X-RAY ABSORPTIOMETRY (DXA) FOR BONE MINERAL DENSITY IMPRESSION: Dear Dr. Dale Hastings, Your patient Shaynna Husby completed a BMD test on 03/26/2015 using the Lunar iDXA DXA System (analysis version: 14.10) manufactured by [employer]. The following summarizes the results of our evaluation. PATIENT BIOGRAPHICAL: Name: Sydney, Anderson Patient ID: [id number] Birth Date: December 15, 1950 Height: 66.0 in. Gender: Female Exam Date: 03/26/2015 Weight: 175.2 lbs. Indications: Caucasian, Family Hx of Osteoporosis, History of Compression Fracture, Hypothyroid, Postmenopausal, right hip replacement Fractures: Lumbar Spine Treatments: Synthroid ASSESSMENT:  The BMD measured at AP Spine L2-L4 is 0.812 g/cm2 with a T-score of -3.2. This patient is considered osteoporotic according to World Health Organization [facility]) criteria. L1 was excluded due to degenerative changes. Right femur was excluded due to surgical hardware. Site       Region Measured   Measured WHO            Young Adult BMD Date       Age      Classification T-score AP Spine L2-L4 03/26/2015 62.9 Osteoporosis -3.2 0.812 g/cm2 Left Femur Total 03/26/2015 62.9 Osteopenia -2.1 0.741 g/cm2  World Science writer (WHO) criteria for post-menopausal, Caucasian Women: Normal:       T-score at or above -1 SD Osteopenia:   T-score between -1 and -2.5 SD Osteoporosis: T-score at or below -2.5 SD RECOMMENDATIONS: National Osteoporosis Foundation recommends that FDA-approved medical therapies be considered in postmenopausal women and men age 86 or older with a: 1. Hip or vertebral (clinical or morphometric) fracture. 2. T-score of < -2.5 at the spine or hip. 3. Ten-year fracture probability by FRAX of 3% or greater for hip fracture or 20% or greater for major osteoporotic fracture. All treatment decisions require clinical judgment and consideration of individual patient factors, including patient preferences, co-morbidities, previous drug use, risk factors not captured in the FRAX model (e.g. falls, vitamin D deficiency, increased bone turnover, interval significant decline in bone density) and possible under - or over-estimation of fracture risk by FRAX. All patients should ensure an adequate intake of dietary calcium (1200 mg/d) and vitamin D (800 IU daily) unless contraindicated. FOLLOW-UP: People with diagnosed cases of osteoporosis or at high risk for fracture should have regular bone mineral density tests. For patients eligible for Medicare, routine testing is allowed once every 2 years. The testing frequency can be increased to one year for patients who have rapidly progressing disease, those who are receiving  or discontinuing medical therapy to restore bone mass, or have additional risk factors. I have reviewed this report, and agree with the above findings. [facility] Radiology Electronically Signed   By: Bretta Bang III M.D.   On: 03/26/2015 09:05   Mm Digital Screening Bilateral  Result Date: 03/26/2015 CLINICAL DATA:  Screening. EXAM: DIGITAL SCREENING BILATERAL MAMMOGRAM WITH CAD COMPARISON:  Previous exam(s). ACR Breast Density Category b: There are scattered areas of fibroglandular density. FINDINGS: There are no findings suspicious for malignancy. Images were processed with CAD. IMPRESSION: No mammographic evidence of malignancy. A result letter of this screening mammogram will be mailed directly to the patient. RECOMMENDATION: Screening mammogram in one year. (Code:SM-B-01Y) BI-RADS CATEGORY  1: Negative. Electronically Signed   By: Dalphine Handing M.D.   On: 03/26/2015 09:42       Assessment & Plan:   Problem List Items Addressed This Visit    Hypercholesteremia    Low cholesterol diet and exercise.  Follow lipid panel.        Hypothyroidism    On thyroid replacement.  Follow tsh.        Osteoporosis    Have discussed bone density results.  Discussed treatment options.  She declines rx medication.  Follow.       Right leg pain    Persistent pain as outlined.  Is exercising.  Limits her activity.  Discussed further evaluation.  She declines.  Follow.        Spinal stenosis    Has seen ortho and Dr Yves Dill.  Saw a spine specialist.  Desires no further intervention.  Follow.       Stress    Increased stress as outlined.  Discussed with her today.  She does not feel needs any further intervention.  Follow.  On prozac.         Other Visit Diagnoses    Screening for breast cancer    -  Primary   Relevant Orders   MM DIGITAL SCREENING BILATERAL       Dale , MD

## 2016-05-11 NOTE — Telephone Encounter (Signed)
Called pt and refilled prozac.  She was in office today.

## 2016-05-15 ENCOUNTER — Encounter: Payer: Self-pay | Admitting: Internal Medicine

## 2016-05-15 NOTE — Assessment & Plan Note (Signed)
Increased stress as outlined.  Discussed with her today.  She does not feel needs any further intervention.  Follow.  On prozac.

## 2016-05-15 NOTE — Assessment & Plan Note (Signed)
On thyroid replacement.  Follow tsh.  

## 2016-05-15 NOTE — Assessment & Plan Note (Signed)
Persistent pain as outlined.  Is exercising.  Limits her activity.  Discussed further evaluation.  She declines.  Follow.

## 2016-05-15 NOTE — Assessment & Plan Note (Signed)
Low cholesterol diet and exercise.  Follow lipid panel.   

## 2016-05-15 NOTE — Assessment & Plan Note (Signed)
Has seen ortho and Dr Yves Dill.  Saw a spine specialist.  Desires no further intervention.  Follow.

## 2016-05-15 NOTE — Assessment & Plan Note (Signed)
Have discussed bone density results.  Discussed treatment options.  She declines rx medication.  Follow.

## 2016-07-05 ENCOUNTER — Ambulatory Visit
Admission: RE | Admit: 2016-07-05 | Discharge: 2016-07-05 | Disposition: A | Discharge status: Home / Self Care | Payer: Managed Care, Other (non HMO) | Source: Ambulatory Visit | Attending: Internal Medicine | Admitting: Internal Medicine

## 2016-07-05 DIAGNOSIS — Z1239 Encounter for other screening for malignant neoplasm of breast: Secondary | ICD-10-CM

## 2016-07-05 DIAGNOSIS — Z1231 Encounter for screening mammogram for malignant neoplasm of breast: Secondary | ICD-10-CM | POA: Diagnosis not present

## 2016-09-06 ENCOUNTER — Other Ambulatory Visit: Payer: Self-pay | Admitting: Internal Medicine

## 2016-09-15 ENCOUNTER — Encounter: Payer: Self-pay | Admitting: Internal Medicine

## 2016-09-23 ENCOUNTER — Encounter: Payer: Self-pay | Admitting: Internal Medicine

## 2016-09-26 ENCOUNTER — Encounter: Payer: Self-pay | Admitting: Internal Medicine

## 2016-11-10 ENCOUNTER — Ambulatory Visit: Payer: Self-pay | Admitting: Internal Medicine

## 2016-11-29 ENCOUNTER — Ambulatory Visit (INDEPENDENT_AMBULATORY_CARE_PROVIDER_SITE_OTHER): Payer: Managed Care, Other (non HMO) | Admitting: Internal Medicine

## 2016-11-29 ENCOUNTER — Encounter: Payer: Self-pay | Admitting: Internal Medicine

## 2016-11-29 VITALS — BP 118/80 | HR 86 | Temp 98.7°F | Resp 20 | Ht 66.0 in | Wt 193.0 lb

## 2016-11-29 DIAGNOSIS — E039 Hypothyroidism, unspecified: Secondary | ICD-10-CM | POA: Diagnosis not present

## 2016-11-29 DIAGNOSIS — Z0000 Encounter for general adult medical examination without abnormal findings: Secondary | ICD-10-CM

## 2016-11-29 DIAGNOSIS — M810 Age-related osteoporosis without current pathological fracture: Secondary | ICD-10-CM | POA: Diagnosis not present

## 2016-11-29 DIAGNOSIS — E7800 Pure hypercholesterolemia, unspecified: Secondary | ICD-10-CM

## 2016-11-29 DIAGNOSIS — F439 Reaction to severe stress, unspecified: Secondary | ICD-10-CM | POA: Diagnosis not present

## 2016-11-29 DIAGNOSIS — M4800 Spinal stenosis, site unspecified: Secondary | ICD-10-CM

## 2016-11-29 NOTE — Assessment & Plan Note (Signed)
Physical today 11/29/16.  PAP 09/08/14 - negative with negative HPV.  Mammogram 07/05/16 - Birads I.  Colonoscopy 04/16/10.

## 2016-11-29 NOTE — Progress Notes (Signed)
Patient ID: Sydney Anderson, female   DOB: 02-29-1950, 66 y.o.   MRN: [id number]   Subjective:    Patient ID: Sydney Anderson, female    DOB: 02-29-1950, 66 y.o.   MRN: [id number]  HPI  Patient here for her physical exam.  Increased stress.  Increased stress with her husband's health issues.  Also stress with "what is going on in the world".  Discussed with her today.  Does not feel she needs any further intervention.  She has started drinking some in the evening.  States it helps her relax.  We discussed this at length.  Discussed the need to not increase amount and to try to find other ways to help her relax.  Still exercising.  No chest pain. No sob.  No acid reflux.  No abdominal pain.  Bowels moving.     Past Medical History:  Diagnosis Date  . Arthritis   . Asthma   . Depression   . Diverticulitis   . GERD (gastroesophageal reflux disease)   . Hypertension   . Hypothyroidism   . Kidney stones   . Migraines    Past Surgical History:  Procedure Laterality Date  . accident  2005  . EYE SURGERY  2010,2011  . FOOT SURGERY  2006  . kidney stones  1980  . SHOULDER ADHESION RELEASE  2008   Family History  Problem Relation Age of Onset  . Arthritis Mother   . Lung cancer Mother   . Mental illness Mother   . Colon cancer Father   . Mental illness Father   . Heart disease Maternal Grandfather   . Stroke Maternal Grandfather   . Breast cancer Paternal Grandmother [id number]   Social History   Social History  . Marital status: Married    Spouse name: N/A  . Number of children: N/A  . Years of education: N/A   Social History Main Topics  . Smoking status: Former Games developermoker  . Smokeless tobacco: Former NeurosurgeonUser    Quit date: 12/05/2007  . Alcohol use No  . Drug use: No  . Sexual activity: Not Asked   Other Topics Concern  . None   Social History Narrative  . None    Outpatient Encounter Prescriptions as of 11/29/2016  Medication Sig  . aspirin 81 MG tablet Take 81  mg by mouth daily.  Marland Kitchen. atropine 1 % ophthalmic solution   . Calcium Citrate-Vitamin D (CALCIUM + D PO) Take by mouth.  Marland Kitchen. FLUoxetine (PROZAC) 10 MG tablet Take 1 tablet (10 mg total) by mouth daily.  Marland Kitchen. levothyroxine (SYNTHROID, LEVOTHROID) 50 MCG tablet take 1 tablet by mouth once daily  . MAGNESIUM PO Take by mouth.  . Menaquinone-7 (VITAMIN K2 PO) Take by mouth.  . methylcellulose (ARTIFICIAL TEARS) 1 % ophthalmic solution Place 1 drop into both eyes as needed.  . metoprolol (LOPRESSOR) 50 MG tablet Take 1 tablet (50 mg total) by mouth daily as needed.  . prednisoLONE acetate (PRED FORTE) 1 % ophthalmic suspension APPLY 1 DROP TO THE RIGHT EYE 4 TIMES A DAY STARTING 2 DAYS BEFORE LASER TREATMENT  . ranitidine (ZANTAC) 150 MG capsule Take 150 mg by mouth daily as needed.   . sodium chloride (OCEAN) 0.65 % nasal spray Place 1 spray into the nose as needed.  . VOLTAREN 1 % GEL   . [DISCONTINUED] timolol (BETIMOL) 0.5 % ophthalmic solution Place 2 drops into the right eye daily.    No facility-administered encounter medications on file as  of 11/29/2016.     Review of Systems  Constitutional: Negative for appetite change and unexpected weight change.  HENT: Negative for congestion and sinus pressure.   Eyes: Negative for pain and visual disturbance.  Respiratory: Negative for cough, chest tightness and shortness of breath.   Cardiovascular: Negative for chest pain, palpitations and leg swelling.  Gastrointestinal: Negative for abdominal pain, diarrhea, nausea and vomiting.  Genitourinary: Negative for difficulty urinating and dysuria.  Musculoskeletal: Negative for joint swelling and myalgias.  Skin: Negative for color change and rash.  Neurological: Negative for dizziness and headaches.  Hematological: Negative for adenopathy. Does not bruise/bleed easily.  Psychiatric/Behavioral: Negative for agitation.       Increased stress as outlined.  No suicidal ideations.         Objective:      Physical Exam  Constitutional: She is oriented to person, place, and time. She appears well-developed and well-nourished. No distress.  HENT:  Nose: Nose normal.  Mouth/Throat: Oropharynx is clear and moist.  Eyes: Right eye exhibits no discharge. Left eye exhibits no discharge. No scleral icterus.  Neck: Neck supple. No thyromegaly present.  Cardiovascular: Normal rate and regular rhythm.   Pulmonary/Chest: Breath sounds normal. No accessory muscle usage. No tachypnea. No respiratory distress. She has no decreased breath sounds. She has no wheezes. She has no rhonchi. Right breast exhibits no inverted nipple, no mass, no nipple discharge and no tenderness (no axillary adenopathy). Left breast exhibits no inverted nipple, no mass, no nipple discharge and no tenderness (no axilarry adenopathy).  Abdominal: Soft. Bowel sounds are normal. There is no tenderness.  Musculoskeletal: She exhibits no edema or tenderness.  Lymphadenopathy:    She has no cervical adenopathy.  Neurological: She is alert and oriented to person, place, and time.  Skin: Skin is warm. No rash noted. No erythema.  Psychiatric: She has a normal mood and affect. Her behavior is normal.    BP 118/80 (BP Location: Left Arm, Patient Position: Sitting, Cuff Size: Large)   Pulse 86   Temp 98.7 F (37.1 C) (Oral)   Resp 20   Ht 5\' 6"  (1.676 m)   Wt 193 lb (87.5 kg)   SpO2 96%   BMI 31.15 kg/m  Wt Readings from Last 3 Encounters:  11/29/16 193 lb (87.5 kg)  05/11/16 188 lb 12.8 oz (85.6 kg)  09/10/15 181 lb 2 oz (82.2 kg)     Lab Results  Component Value Date   WBC 6.0 03/20/2015   HGB 14.1 03/20/2015   HCT 41.5 03/20/2015   PLT 278 03/20/2015   GLUCOSE 89 03/20/2015   CHOL 188 03/20/2015   TRIG 68 03/20/2015   HDL 62 03/20/2015   LDLCALC 112 (H) 03/20/2015   ALT 18 03/20/2015   AST 19 03/20/2015   NA 144 03/20/2015   K 4.3 03/20/2015   CL 104 03/20/2015   CREATININE 0.67 03/20/2015   BUN 11 03/20/2015    CO2 29 10/16/2014   TSH 2.160 03/20/2015   INR 1.0 11/07/2012    Mm Digital Screening Bilateral  Result Date: 07/05/2016 CLINICAL DATA:  Screening. EXAM: DIGITAL SCREENING BILATERAL MAMMOGRAM WITH CAD COMPARISON:  Previous exam(s). ACR Breast Density Category b: There are scattered areas of fibroglandular density. FINDINGS: There are no findings suspicious for malignancy. Images were processed with CAD. IMPRESSION: No mammographic evidence of malignancy. A result letter of this screening mammogram will be mailed directly to the patient. RECOMMENDATION: Screening mammogram in one year. (Code:SM-B-01Y) BI-RADS CATEGORY  1: Negative. Electronically Signed   By: Frederico Hamman M.D.   On: 07/05/2016 16:13       Assessment & Plan:   Problem List Items Addressed This Visit    Health care maintenance    Physical today 11/29/16.  PAP 09/08/14 - negative with negative HPV.  Mammogram 07/05/16 - Birads I.  Colonoscopy 04/16/10.        Hypercholesteremia    Low cholesterol diet and exercise.  Follow lipid panel.        Hypothyroidism    On thyroid replacement.  Follow tsh.        Osteoporosis    Declines rx medications.  Will be due f/u bone density next year.        Spinal stenosis    Has seen ortho and Dr Yves Dill.  Saw a spine specialist.  Is exercising.  Follow.        Stress    Increased stress as outlined.  Discussed at length with her today.  She does not feel needs any further intervention.  Discussed decreasing alcohol intake.  Follow.         Other Visit Diagnoses    Routine general medical examination at a health care facility    -  Primary       Dale [place], MD

## 2016-11-30 ENCOUNTER — Encounter: Payer: Self-pay | Admitting: Internal Medicine

## 2016-12-01 NOTE — Telephone Encounter (Signed)
Order faxed to number above

## 2016-12-01 NOTE — Telephone Encounter (Signed)
rx for labs written and placed in box.

## 2016-12-02 ENCOUNTER — Encounter: Payer: Self-pay | Admitting: Internal Medicine

## 2016-12-02 NOTE — Assessment & Plan Note (Signed)
On thyroid replacement.  Follow tsh.  

## 2016-12-02 NOTE — Assessment & Plan Note (Signed)
Has seen ortho and Dr Yves Dillhasnis.  Saw a spine specialist.  Is exercising.  Follow.

## 2016-12-02 NOTE — Assessment & Plan Note (Signed)
Increased stress as outlined.  Discussed at length with her today.  She does not feel needs any further intervention.  Discussed decreasing alcohol intake.  Follow.

## 2016-12-02 NOTE — Assessment & Plan Note (Signed)
Declines rx medications.  Will be due f/u bone density next year.

## 2016-12-02 NOTE — Assessment & Plan Note (Signed)
Low cholesterol diet and exercise.  Follow lipid panel.   

## 2016-12-14 ENCOUNTER — Other Ambulatory Visit: Payer: Self-pay

## 2016-12-14 DIAGNOSIS — E7800 Pure hypercholesterolemia, unspecified: Secondary | ICD-10-CM

## 2016-12-14 DIAGNOSIS — Z299 Encounter for prophylactic measures, unspecified: Secondary | ICD-10-CM

## 2016-12-14 DIAGNOSIS — E039 Hypothyroidism, unspecified: Secondary | ICD-10-CM

## 2016-12-14 DIAGNOSIS — D649 Anemia, unspecified: Secondary | ICD-10-CM

## 2016-12-14 NOTE — Progress Notes (Signed)
Patient in office today for fasting labwk only drawn from right arm.

## 2016-12-15 LAB — CMP12+LP+TP+TSH+6AC+CBC/D/PLT
A/G RATIO: 1.9 (ref 1.2–2.2)
ALK PHOS: 135 IU/L — AB (ref 39–117)
ALT: 38 IU/L — ABNORMAL HIGH (ref 0–32)
AST: 34 IU/L (ref 0–40)
Albumin: 4.3 g/dL (ref 3.6–4.8)
BASOS ABS: 0.1 10*3/uL (ref 0.0–0.2)
BILIRUBIN TOTAL: 0.3 mg/dL (ref 0.0–1.2)
BUN/Creatinine Ratio: 14 (ref 12–28)
BUN: 9 mg/dL (ref 8–27)
Basos: 1 %
Calcium: 8.9 mg/dL (ref 8.7–10.3)
Chloride: 106 mmol/L (ref 96–106)
Chol/HDL Ratio: 3.5 ratio (ref 0.0–4.4)
Cholesterol, Total: 189 mg/dL (ref 100–199)
Creatinine, Ser: 0.66 mg/dL (ref 0.57–1.00)
EOS (ABSOLUTE): 0.2 10*3/uL (ref 0.0–0.4)
Eos: 3 %
Estimated CHD Risk: 0.6 times avg. (ref 0.0–1.0)
FREE THYROXINE INDEX: 1.6 (ref 1.2–4.9)
GFR calc non Af Amer: 94 mL/min/{1.73_m2} (ref >59)
GFR, EST AFRICAN AMERICAN: 108 mL/min/{1.73_m2} (ref >59)
GGT: 87 IU/L — ABNORMAL HIGH (ref 0–60)
GLUCOSE: 102 mg/dL — AB (ref 65–99)
Globulin, Total: 2.3 g/dL (ref 1.5–4.5)
HDL: 54 mg/dL (ref >39)
HEMOGLOBIN: 14.6 g/dL (ref 11.1–15.9)
Hematocrit: 43.2 % (ref 34.0–46.6)
IMMATURE GRANS (ABS): 0 10*3/uL (ref 0.0–0.1)
IMMATURE GRANULOCYTES: 0 %
Iron: 60 ug/dL (ref 27–139)
LDH: 194 IU/L (ref 119–226)
LDL Calculated: 114 mg/dL — ABNORMAL HIGH (ref 0–99)
Lymphocytes Absolute: 1.7 10*3/uL (ref 0.7–3.1)
Lymphs: 28 %
MCH: 30.3 pg (ref 26.6–33.0)
MCHC: 33.8 g/dL (ref 31.5–35.7)
MCV: 90 fL (ref 79–97)
MONOCYTES: 4 %
Monocytes Absolute: 0.3 10*3/uL (ref 0.1–0.9)
NEUTROS PCT: 64 %
Neutrophils Absolute: 3.9 10*3/uL (ref 1.4–7.0)
PLATELETS: 271 10*3/uL (ref 150–379)
Phosphorus: 3.7 mg/dL (ref 2.5–4.5)
Potassium: 4.7 mmol/L (ref 3.5–5.2)
RBC: 4.82 x10E6/uL (ref 3.77–5.28)
RDW: 13.7 % (ref 12.3–15.4)
Sodium: 142 mmol/L (ref 134–144)
T3 UPTAKE RATIO: 27 % (ref 24–39)
T4, Total: 5.8 ug/dL (ref 4.5–12.0)
TSH: 3.58 u[IU]/mL (ref 0.450–4.500)
Total Protein: 6.6 g/dL (ref 6.0–8.5)
Triglycerides: 103 mg/dL (ref 0–149)
URIC ACID: 5.2 mg/dL (ref 2.5–7.1)
VLDL CHOLESTEROL CAL: 21 mg/dL (ref 5–40)
WBC: 6.1 10*3/uL (ref 3.4–10.8)

## 2016-12-26 ENCOUNTER — Telehealth: Payer: Self-pay

## 2016-12-26 NOTE — Telephone Encounter (Signed)
Called patient regarding outside labs. Per your note she will need to have Liver function retested in about 2 weeks. [place]. She did not have any questions at this time.

## 2016-12-27 NOTE — Telephone Encounter (Signed)
rx for labs placed in box.  Please add fax number to rx.

## 2016-12-27 NOTE — Telephone Encounter (Signed)
Order faxed.

## 2017-01-16 ENCOUNTER — Other Ambulatory Visit: Payer: Self-pay

## 2017-01-17 ENCOUNTER — Other Ambulatory Visit: Payer: Self-pay | Admitting: Internal Medicine

## 2017-01-23 ENCOUNTER — Other Ambulatory Visit: Payer: Self-pay

## 2017-01-23 DIAGNOSIS — Z299 Encounter for prophylactic measures, unspecified: Secondary | ICD-10-CM

## 2017-01-23 NOTE — Progress Notes (Signed)
Patient came in to have blood drawn for testing per Dr. Charlene Scott's orders. 

## 2017-01-24 LAB — HEPATIC FUNCTION PANEL
ALK PHOS: 154 IU/L — AB (ref 39–117)
ALT: 42 IU/L — AB (ref 0–32)
AST: 38 IU/L (ref 0–40)
Albumin: 4.3 g/dL (ref 3.6–4.8)
BILIRUBIN, DIRECT: 0.12 mg/dL (ref 0.00–0.40)
Bilirubin Total: 0.4 mg/dL (ref 0.0–1.2)
TOTAL PROTEIN: 6.5 g/dL (ref 6.0–8.5)

## 2017-01-24 NOTE — Progress Notes (Signed)
Make sure she has follow up appointment with Dr. Nicki Reaper.  Alk Phos more elevated

## 2017-02-12 ENCOUNTER — Other Ambulatory Visit: Payer: Self-pay | Admitting: Internal Medicine

## 2017-02-12 DIAGNOSIS — R945 Abnormal results of liver function studies: Secondary | ICD-10-CM

## 2017-02-12 DIAGNOSIS — R7989 Other specified abnormal findings of blood chemistry: Secondary | ICD-10-CM

## 2017-02-12 NOTE — Progress Notes (Signed)
Order placed for abdominal ultrasound.   

## 2017-02-20 ENCOUNTER — Ambulatory Visit: Payer: Managed Care, Other (non HMO)

## 2017-02-23 ENCOUNTER — Ambulatory Visit
Admission: RE | Admit: 2017-02-23 | Discharge: 2017-02-23 | Disposition: A | Discharge status: Home / Self Care | Payer: Managed Care, Other (non HMO) | Source: Ambulatory Visit | Attending: Internal Medicine | Admitting: Internal Medicine

## 2017-02-23 DIAGNOSIS — R7989 Other specified abnormal findings of blood chemistry: Secondary | ICD-10-CM

## 2017-02-23 DIAGNOSIS — R945 Abnormal results of liver function studies: Secondary | ICD-10-CM | POA: Diagnosis not present

## 2017-02-28 ENCOUNTER — Telehealth: Payer: Self-pay | Admitting: Internal Medicine

## 2017-02-28 NOTE — Telephone Encounter (Signed)
Attempted to call the pt.  Left voice message to return call for results.

## 2017-02-28 NOTE — Telephone Encounter (Signed)
Copied from CRM [phone]. Topic: Quick Communication - Lab Results >> Feb 24, 2017 11:46 AM Larry Sierrasavis, Trisha L, LPN wrote: Called patient to inform them of CT results. When patient returns call, triage nurse may disclose results.  Pt called back about results. Please try her work phone [phone]

## 2017-03-02 ENCOUNTER — Encounter: Payer: Self-pay | Admitting: *Deleted

## 2017-03-14 ENCOUNTER — Other Ambulatory Visit: Payer: Self-pay | Admitting: Internal Medicine

## 2017-04-06 ENCOUNTER — Ambulatory Visit: Payer: Self-pay | Admitting: Internal Medicine

## 2017-04-18 ENCOUNTER — Encounter: Payer: Self-pay | Admitting: Family Medicine

## 2017-04-18 ENCOUNTER — Ambulatory Visit: Payer: Self-pay | Admitting: Family Medicine

## 2017-04-18 VITALS — BP 126/88 | HR 78 | Temp 98.4°F | Resp 18

## 2017-04-18 DIAGNOSIS — J069 Acute upper respiratory infection, unspecified: Secondary | ICD-10-CM

## 2017-04-18 NOTE — Progress Notes (Signed)
Subjective: congestion     Sydney Anderson is a 67 y.o. female who presents for evaluation of  Mild cough with green sputum, fatigue, and mild nasal congestion since last thursday.  Patient presents today to get a note to go back to work.  Patient initially had symptoms Thursday through Saturday  of fever (T-max 102), chest congestion, sore throat, hoarseness.  Patient reports significant improvement in symptoms and resolution of her symptoms with the exception of nasal congestion, fatigue, and cough which are very mild.  Fever subsided Saturday. Treatment to date: Rest, fluids.  Denies rash, nausea, vomiting, diarrhea, SOB, wheezing,  chest or back pain, ear pain, sore throat, difficulty swallowing, confusion, purulent nasal discharge, anosmia/hyposmia, dental pain, facial pressure/unilateral facial pain, pain exacerbated by bending over, headache, body aches,  fever, chills, sneezing, ocular pruritis/discharge, initial improvement and then worsening of symptoms.  Denies any other symptoms or complaints. History of smoking, asthma, COPD: Denies. History of recurrent sinus and/or lung infections: Denies.  Review of Systems Pertinent items noted in HPI and remainder of comprehensive ROS otherwise negative.     Objective:   Physical Exam General: Awake, alert, and oriented. No acute distress. Well developed, hydrated and nourished. Appears stated age. Nontoxic appearance.  HEENT: No PND noted.  No erythema to posterior oropharynx.  No edema or exudates of pharynx or tonsils. No erythema or bulging of TM.  Mild erythema/edema to nasal mucosa. Sinuses nontender. Supple neck without adenopathy. Cardiac: Heart rate and rhythm are normal. No murmurs, gallops, or rubs are auscultated. S1 and S2 are heard and are of normal intensity.  Respiratory: No signs of respiratory distress. Lungs clear. No tachypnea. Able to speak in full sentences without dyspnea. Nonlabored respirations.  Skin: Skin is warm,  dry and intact. Appropriate color for ethnicity. No cyanosis noted.   Diagnostic Results: None.  Assessment:    viral upper respiratory illness   Plan:    Discussed diagnosis and treatment of URI. Suggested symptomatic OTC remedies.   Discussed red flag symptoms and circumstances with which to seek medical care.   New Prescriptions   No medications on file

## 2017-05-15 ENCOUNTER — Ambulatory Visit: Payer: Self-pay | Admitting: Internal Medicine

## 2017-07-04 ENCOUNTER — Encounter: Payer: Self-pay | Admitting: Internal Medicine

## 2017-07-04 ENCOUNTER — Ambulatory Visit: Payer: Managed Care, Other (non HMO) | Admitting: Internal Medicine

## 2017-07-04 DIAGNOSIS — M4800 Spinal stenosis, site unspecified: Secondary | ICD-10-CM | POA: Diagnosis not present

## 2017-07-04 DIAGNOSIS — E7800 Pure hypercholesterolemia, unspecified: Secondary | ICD-10-CM

## 2017-07-04 DIAGNOSIS — E039 Hypothyroidism, unspecified: Secondary | ICD-10-CM | POA: Diagnosis not present

## 2017-07-04 DIAGNOSIS — M810 Age-related osteoporosis without current pathological fracture: Secondary | ICD-10-CM | POA: Diagnosis not present

## 2017-07-04 DIAGNOSIS — F439 Reaction to severe stress, unspecified: Secondary | ICD-10-CM

## 2017-07-04 DIAGNOSIS — K219 Gastro-esophageal reflux disease without esophagitis: Secondary | ICD-10-CM | POA: Diagnosis not present

## 2017-07-04 DIAGNOSIS — D509 Iron deficiency anemia, unspecified: Secondary | ICD-10-CM

## 2017-07-04 NOTE — Patient Instructions (Signed)
Phone number:  [phone]

## 2017-07-04 NOTE — Progress Notes (Signed)
Patient ID: Sydney Anderson, female   DOB: 08-23-1950, 67 y.o.   MRN: [id number]   Subjective:    Patient ID: Sydney Anderson, female    DOB: 1950-10-24, 67 y.o.   MRN: [id number]  HPI  Patient here for a scheduled follow up.  She reports she is doing relatively well.  Still with increased stress, but overall she feels she is handling things relatively well.  Still dealing with her husband's health issues and financial issues.  Increased cost of medications is a real stressor for her.  Discussed ALAMAP.  Overall she feels she is doing better in dealing with the stress.  Physically she feels things are stable.  Breathing stable.  No increased cough or congestion.  No sob.  No chest pain.  No acid reflux.  No abdominal pain.  Bowels moving.  Still with increased pain.  Still trying to stay active.  Desires no further intervention or medication.     Past Medical History:  Diagnosis Date  . Arthritis   . Asthma   . Depression   . Diverticulitis   . GERD (gastroesophageal reflux disease)   . Hypertension   . Hypothyroidism   . Kidney stones   . Migraines    Past Surgical History:  Procedure Laterality Date  . accident  2005  . EYE SURGERY  2010,2011  . FOOT SURGERY  2006  . kidney stones  1980  . SHOULDER ADHESION RELEASE  2008   Family History  Problem Relation Age of Onset  . Arthritis Mother   . Lung cancer Mother   . Mental illness Mother   . Colon cancer Father   . Mental illness Father   . Heart disease Maternal Grandfather   . Stroke Maternal Grandfather   . Breast cancer Paternal Grandmother 46   Social History   Socioeconomic History  . Marital status: Married    Spouse name: Not on file  . Number of children: Not on file  . Years of education: Not on file  . Highest education level: Not on file  Occupational History  . Not on file  Social Needs  . Financial resource strain: Not on file  . Food insecurity:    Worry: Not on file    Inability: Not on  file  . Transportation needs:    Medical: Not on file    Non-medical: Not on file  Tobacco Use  . Smoking status: Former Games developer  . Smokeless tobacco: Former Neurosurgeon    Quit date: 12/05/2007  Substance and Sexual Activity  . Alcohol use: No    Alcohol/week: 0.0 oz  . Drug use: No  . Sexual activity: Not on file  Lifestyle  . Physical activity:    Days per week: Not on file    Minutes per session: Not on file  . Stress: Not on file  Relationships  . Social connections:    Talks on phone: Not on file    Gets together: Not on file    Attends religious service: Not on file    Active member of club or organization: Not on file    Attends meetings of clubs or organizations: Not on file    Relationship status: Not on file  Other Topics Concern  . Not on file  Social History Narrative  . Not on file    Outpatient Encounter Medications as of 07/04/2017  Medication Sig  . aspirin 81 MG tablet Take 81 mg by mouth daily.  Marland Kitchen atropine 1 %  ophthalmic solution   . Calcium Citrate-Vitamin D (CALCIUM + D PO) Take by mouth.  Marland Kitchen FLUoxetine (PROZAC) 10 MG tablet take 1 tablet by mouth once daily  . levothyroxine (SYNTHROID, LEVOTHROID) 50 MCG tablet take 1 tablet by mouth once daily  . MAGNESIUM PO Take by mouth.  . methylcellulose (ARTIFICIAL TEARS) 1 % ophthalmic solution Place 1 drop into both eyes as needed.  . metoprolol (LOPRESSOR) 50 MG tablet Take 1 tablet (50 mg total) by mouth daily as needed.  . prednisoLONE acetate (PRED FORTE) 1 % ophthalmic suspension APPLY 1 DROP TO THE RIGHT EYE 4 TIMES A DAY STARTING 2 DAYS BEFORE LASER TREATMENT  . ranitidine (ZANTAC) 150 MG capsule Take 150 mg by mouth daily as needed.   . sodium chloride (OCEAN) 0.65 % nasal spray Place 1 spray into the nose as needed.  . VOLTAREN 1 % GEL    No facility-administered encounter medications on file as of 07/04/2017.     Review of Systems  Constitutional: Negative for appetite change and unexpected weight  change.  HENT: Negative for congestion and sinus pressure.   Respiratory: Negative for cough, chest tightness and shortness of breath.   Cardiovascular: Negative for chest pain, palpitations and leg swelling.  Gastrointestinal: Negative for abdominal pain, diarrhea, nausea and vomiting.  Genitourinary: Negative for difficulty urinating and dysuria.  Musculoskeletal: Positive for joint swelling.       Increased pain as outlined.    Skin: Negative for color change and rash.  Neurological: Negative for dizziness, light-headedness and headaches.  Psychiatric/Behavioral: Negative for agitation and dysphoric mood.       Increased stress as outlined.        Objective:    Physical Exam  Constitutional: She appears well-developed and well-nourished. No distress.  HENT:  Nose: Nose normal.  Mouth/Throat: Oropharynx is clear and moist.  Neck: Neck supple. No thyromegaly present.  Cardiovascular: Normal rate and regular rhythm.  Pulmonary/Chest: Breath sounds normal. No respiratory distress. She has no wheezes.  Abdominal: Soft. Bowel sounds are normal. There is no tenderness.  Musculoskeletal: She exhibits no edema or tenderness.  Lymphadenopathy:    She has no cervical adenopathy.  Skin: No rash noted. No erythema.  Psychiatric: She has a normal mood and affect. Her behavior is normal.    BP 124/76 (BP Location: Left Arm, Patient Position: Sitting, Cuff Size: Normal)   Pulse (!) 58   Temp 98.3 F (36.8 C) (Oral)   Resp 18   Wt 192 lb 12.8 oz (87.5 kg)   SpO2 98%   BMI 31.12 kg/m  Wt Readings from Last 3 Encounters:  07/04/17 192 lb 12.8 oz (87.5 kg)  11/29/16 193 lb (87.5 kg)  05/11/16 188 lb 12.8 oz (85.6 kg)     Lab Results  Component Value Date   WBC 6.1 12/14/2016   HGB 14.6 12/14/2016   HCT 43.2 12/14/2016   PLT 271 12/14/2016   GLUCOSE 102 (H) 12/14/2016   CHOL 189 12/14/2016   TRIG 103 12/14/2016   HDL 54 12/14/2016   LDLCALC 114 (H) 12/14/2016   ALT 42 (H)  01/23/2017   AST 38 01/23/2017   NA 142 12/14/2016   K 4.7 12/14/2016   CL 106 12/14/2016   CREATININE 0.66 12/14/2016   BUN 9 12/14/2016   CO2 29 10/16/2014   TSH 3.580 12/14/2016   INR 1.0 11/07/2012    US Abdomen Complete  Result Date: 02/23/2017 CLINICAL DATA:  Abnormal liver function tests. EXAM: ABDOMEN  ULTRASOUND COMPLETE COMPARISON:  MRI 06/23/2014. FINDINGS: Gallbladder: No gallstones or wall thickening visualized. No sonographic Murphy sign noted by sonographer. Common bile duct: Diameter: 4.5 mm Liver: Increased echogenicity consistent fatty infiltration and/or hepatocellular disease. Portal vein is patent on color Doppler imaging with normal direction of blood flow towards the liver. IVC: No abnormality visualized. Pancreas: Visualized portion unremarkable. Spleen: Size and appearance within normal limits. Right Kidney: Length: 10.0 cm. Echogenicity within normal limits. No mass or hydronephrosis visualized. Left Kidney: Length: 11.4 cm. Echogenicity within normal limits. No mass or hydronephrosis visualized. Abdominal aorta: No aneurysm visualized. Other findings: None. IMPRESSION: 1. Increased hepatic echogenicity consistent with fatty infiltration and/or hepatocellular disease. 2.  No gallstones or biliary distention. Electronically Signed   By: Maisie Fus  Register   On: 02/23/2017 13:41       Assessment & Plan:   Problem List Items Addressed This Visit    Anemia    Has had GI w/up for iron deficient anemia.  Colonoscopy 08/2013.  Recommended f/u 08/2018.  Follow cbc.       GERD (gastroesophageal reflux disease)    Upper symptoms controlled.  Follow.        Hypercholesteremia    Low cholesterol diet and exercise.  Follow lipid panel.        Hypothyroidism    On thyroid replacement.  Follow tsh.        Osteoporosis    Declines prescription medication.  Will need f/u bone density.        Spinal stenosis    Has seen ortho and Dr Yves Dill.  Saw a spine specialist.   Persistent pain.  Declines any further evaluation or treatment.  Follow.        Stress    Increased stress as outlined.  Discussed with her today.  She declines any further intervention.  Feels she is coping better.  Follow.            Dale [place], MD

## 2017-07-09 ENCOUNTER — Encounter: Payer: Self-pay | Admitting: Internal Medicine

## 2017-07-09 NOTE — Assessment & Plan Note (Signed)
Declines prescription medication.  Will need f/u bone density.

## 2017-07-09 NOTE — Assessment & Plan Note (Signed)
Increased stress as outlined.  Discussed with her today.  She declines any further intervention.  Feels she is coping better.  Follow.

## 2017-07-09 NOTE — Assessment & Plan Note (Signed)
Upper symptoms controlled.  Follow.  

## 2017-07-09 NOTE — Assessment & Plan Note (Signed)
Low cholesterol diet and exercise.  Follow lipid panel.   

## 2017-07-09 NOTE — Assessment & Plan Note (Signed)
On thyroid replacement.  Follow tsh.  

## 2017-07-09 NOTE — Assessment & Plan Note (Signed)
Has had GI w/up for iron deficient anemia.  Colonoscopy 08/2013.  Recommended f/u 08/2018.  Follow cbc.

## 2017-07-09 NOTE — Assessment & Plan Note (Signed)
Has seen ortho and Dr Yves Dillhasnis.  Saw a spine specialist.  Persistent pain.  Declines any further evaluation or treatment.  Follow.

## 2017-07-26 ENCOUNTER — Ambulatory Visit: Payer: Self-pay | Admitting: Family Medicine

## 2017-07-26 VITALS — BP 125/70 | HR 62 | Resp 16 | Ht 66.0 in | Wt 190.0 lb

## 2017-07-26 DIAGNOSIS — E7800 Pure hypercholesterolemia, unspecified: Secondary | ICD-10-CM

## 2017-07-26 DIAGNOSIS — E039 Hypothyroidism, unspecified: Secondary | ICD-10-CM

## 2017-07-26 DIAGNOSIS — Z0189 Encounter for other specified special examinations: Principal | ICD-10-CM

## 2017-07-26 DIAGNOSIS — Z008 Encounter for other general examination: Secondary | ICD-10-CM

## 2017-07-26 NOTE — Progress Notes (Signed)
Subjective: Annual biometrics screening  Patient presents for her annual biometric screening. Patient reports eating a healthy, well-rounded diet and getting regular physical activity.  Patient regularly sees her primary care provider. PCP: Dr. Lorin PicketScott. Patient denies any other issues or concerns.   Review of Systems Unremarkable  Objective  Physical Exam General: Awake, alert and oriented. No acute distress. Well developed, hydrated and nourished. Appears stated age.  HEENT: Supple neck without adenopathy. Sclera is non-icteric. The ear canal is clear without discharge. The tympanic membrane is normal in appearance with normal landmarks and cone of light. Nasal mucosa is pink and moist. Oral mucosa is pink and moist. The pharynx is normal in appearance without tonsillar swelling or exudates.  Skin: Skin in warm, dry and intact without rashes or lesions. Appropriate color for ethnicity. Cardiac: Heart rate and rhythm are normal. No murmurs, gallops, or rubs are auscultated.  Respiratory: The chest wall is symmetric and without deformity. No signs of respiratory distress. Lung sounds are clear in all lobes bilaterally without rales, ronchi, or wheezes.  Neurological: The patient is awake, alert and oriented to person, place, and time with normal speech.  Memory is normal and thought processes intact. No gait abnormalities are appreciated.  Psychiatric: Appropriate mood and affect.   Assessment Annual biometrics screening  Plan  Lipid panel and fasting blood sugar pending - ordered by patient's primary care provider. Encouraged routine visits with primary care provider.  Encouraged patient to get regular exercise and eat a healthy, well-rounded diet.

## 2017-07-27 LAB — CBC WITH DIFFERENTIAL/PLATELET
Basophils Absolute: 0.1 10*3/uL (ref 0.0–0.2)
Basos: 1 %
EOS (ABSOLUTE): 0.1 10*3/uL (ref 0.0–0.4)
EOS: 2 %
HEMATOCRIT: 43.7 % (ref 34.0–46.6)
HEMOGLOBIN: 14.6 g/dL (ref 11.1–15.9)
IMMATURE GRANULOCYTES: 0 %
Immature Grans (Abs): 0 10*3/uL (ref 0.0–0.1)
Lymphocytes Absolute: 2.4 10*3/uL (ref 0.7–3.1)
Lymphs: 36 %
MCH: 29.6 pg (ref 26.6–33.0)
MCHC: 33.4 g/dL (ref 31.5–35.7)
MCV: 89 fL (ref 79–97)
MONOS ABS: 0.3 10*3/uL (ref 0.1–0.9)
Monocytes: 4 %
NEUTROS PCT: 57 %
Neutrophils Absolute: 3.7 10*3/uL (ref 1.4–7.0)
Platelets: 265 10*3/uL (ref 150–450)
RBC: 4.93 x10E6/uL (ref 3.77–5.28)
RDW: 14.1 % (ref 12.3–15.4)
WBC: 6.6 10*3/uL (ref 3.4–10.8)

## 2017-07-27 LAB — BASIC METABOLIC PANEL
BUN/Creatinine Ratio: 16 (ref 12–28)
BUN: 11 mg/dL (ref 8–27)
CALCIUM: 9.2 mg/dL (ref 8.7–10.3)
CO2: 22 mmol/L (ref 20–29)
Chloride: 105 mmol/L (ref 96–106)
Creatinine, Ser: 0.7 mg/dL (ref 0.57–1.00)
GFR calc Af Amer: 105 mL/min/{1.73_m2} (ref >59)
GFR, EST NON AFRICAN AMERICAN: 91 mL/min/{1.73_m2} (ref >59)
Glucose: 97 mg/dL (ref 65–99)
POTASSIUM: 4.9 mmol/L (ref 3.5–5.2)
Sodium: 142 mmol/L (ref 134–144)

## 2017-07-27 LAB — HEPATIC FUNCTION PANEL
ALK PHOS: 142 IU/L — AB (ref 39–117)
ALT: 32 IU/L (ref 0–32)
AST: 25 IU/L (ref 0–40)
Albumin: 4.4 g/dL (ref 3.6–4.8)
BILIRUBIN TOTAL: 0.5 mg/dL (ref 0.0–1.2)
Bilirubin, Direct: 0.14 mg/dL (ref 0.00–0.40)
Total Protein: 6.7 g/dL (ref 6.0–8.5)

## 2017-07-27 LAB — TSH: TSH: 2.24 u[IU]/mL (ref 0.450–4.500)

## 2017-07-27 LAB — LIPID PANEL
CHOL/HDL RATIO: 3.3 ratio (ref 0.0–4.4)
CHOLESTEROL TOTAL: 175 mg/dL (ref 100–199)
HDL: 53 mg/dL (ref >39)
LDL Calculated: 107 mg/dL — ABNORMAL HIGH (ref 0–99)
TRIGLYCERIDES: 75 mg/dL (ref 0–149)
VLDL Cholesterol Cal: 15 mg/dL (ref 5–40)

## 2017-08-01 ENCOUNTER — Telehealth: Payer: Self-pay | Admitting: Internal Medicine

## 2017-08-01 ENCOUNTER — Ambulatory Visit: Payer: Self-pay

## 2017-08-01 NOTE — Telephone Encounter (Signed)
Copied from CRM [phone]. Topic: Quick Communication - Lab Results >> Jul 31, 2017 10:17 AM Dennie Bibleavis, Kathy R, LPN wrote: Called patient to inform them of 07/28/17 lab results. When patient returns call, triage nurse may disclose results.  >> Aug 01, 2017  9:22 AM Henry Russelawoud, Jessica L wrote: Pt called back for lab results. Pt stated that you could leave a detailed message on her cell phone [phone]) or email her a message([email or web address]). Please advise.

## 2017-08-01 NOTE — Telephone Encounter (Signed)
Telephone call to Client.  Read result notes of Dr. Dale Durhamharlene Scott from 07/28/17 . Client stated that a message could be left on her phone.  Left message to call the office she had  Questions and if she agreed with referral.

## 2017-08-02 ENCOUNTER — Encounter: Payer: Self-pay | Admitting: Internal Medicine

## 2017-08-04 NOTE — Telephone Encounter (Signed)
Reviewed my chart.  No able to be referred to GI at this time.  We will follow liver panel.  Please schedule her a f/u appt in 6 months.  Did not get scheduled at her appt.  Thanks

## 2017-08-04 NOTE — Telephone Encounter (Signed)
Scheduled appt.

## 2017-08-17 ENCOUNTER — Other Ambulatory Visit: Payer: Self-pay | Admitting: Internal Medicine

## 2017-09-16 ENCOUNTER — Other Ambulatory Visit: Payer: Self-pay | Admitting: Internal Medicine

## 2018-01-11 ENCOUNTER — Ambulatory Visit: Payer: Self-pay | Admitting: Internal Medicine

## 2018-03-30 ENCOUNTER — Other Ambulatory Visit: Payer: Self-pay

## 2018-03-30 MED ORDER — LEVOTHYROXINE SODIUM 50 MCG PO TABS: 50.0000 ug | ORAL_TABLET | Freq: Every day | ORAL | 1 refills | Status: DC

## 2018-03-30 MED ORDER — FLUOXETINE HCL 10 MG PO TABS: 10.0000 mg | ORAL_TABLET | Freq: Every day | ORAL | 5 refills | Status: DC

## 2018-04-03 ENCOUNTER — Encounter: Payer: Self-pay | Admitting: Internal Medicine

## 2018-04-11 ENCOUNTER — Ambulatory Visit: Payer: Self-pay | Admitting: Internal Medicine

## 2018-04-11 ENCOUNTER — Telehealth: Payer: Self-pay

## 2018-04-11 NOTE — Telephone Encounter (Signed)
Copied from CRM [id number]. Topic: Quick Communication - Appointment Cancellation >> Apr 11, 2018  8:05 AM Maia Petties wrote: Patient called to cancel appointment scheduled for today 1pm with Dr. Lorin Picket. Patient has not rescheduled their appointment BUT needs to reschedule  pt called in with high fever and cough - she is afraid she is contagious and doesn't want to spread it - pt states appt was cancelled last week due to providers schedule - she notes she has to have cpe before 07/08/2018 for her employer. Please advise on having pt come in prior to 5/31 as next available with Dr. Lorin Picket is currently 7/9.   Route to department's PEC pool.

## 2018-04-11 NOTE — Telephone Encounter (Signed)
Called pt back to triage for high fever and cough since pt cancelled appt for today.  LMFCB.

## 2018-04-11 NOTE — Telephone Encounter (Signed)
LMTCB

## 2018-04-13 ENCOUNTER — Telehealth: Payer: Managed Care, Other (non HMO) | Admitting: Family

## 2018-04-13 DIAGNOSIS — J111 Influenza due to unidentified influenza virus with other respiratory manifestations: Secondary | ICD-10-CM

## 2018-04-13 MED ORDER — OSELTAMIVIR PHOSPHATE 75 MG PO CAPS: 75.0000 mg | ORAL_CAPSULE | Freq: Two times a day (BID) | ORAL | 0 refills | Status: DC

## 2018-04-13 NOTE — Progress Notes (Signed)
Greater than 5 minutes, yet less than 10 minutes of time have been spent researching, coordinating, and implementing care for this patient today.  Thank you for the details you included in the comment boxes. Those details are very helpful in determining the best course of treatment for you and help [place] to provide the best care.  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 You may return Monday.   E visit for Flu like symptoms   We are sorry that you are not feeling well.  Here is how we plan to help! Based on what you have shared with me it looks like you may have a respiratory virus that may be influenza.  Influenza or "the flu" is   an infection caused by a respiratory virus. The flu virus is highly contagious and persons who did not receive their yearly flu vaccination may "catch" the flu from close contact.  We have anti-viral medications to treat the viruses that cause this infection. They are not a "cure" and only shorten the course of the infection. These prescriptions are most effective when they are given within the first 2 days of "flu" symptoms. Antiviral medication are indicated if you have a high risk of complications from the flu. You should  also consider an antiviral medication if you are in close contact with someone who is at risk. These medications can help patients avoid complications from the flu  but have side effects that you should know. Possible side effects from Tamiflu or oseltamivir include nausea, vomiting, diarrhea, dizziness, headaches, eye redness, sleep problems or other respiratory symptoms. You should not take Tamiflu if you have an allergy to oseltamivir or any to the ingredients in Tamiflu.  Based upon your symptoms and potential risk factors I have prescribed Oseltamivir (Tamiflu).  It has been sent to your designated pharmacy.  You will take one 75 mg capsule orally twice a day for the next 5 days.  ANYONE WHO  HAS FLU SYMPTOMS SHOULD: . Stay home. The flu is highly contagious and going out or to work exposes others! . Be sure to drink plenty of fluids. Water is fine as well as fruit juices, sodas and electrolyte beverages. You may want to stay away from caffeine or alcohol. If you are nauseated, try taking small sips of liquids. How do you know if you are getting enough fluid? Your urine should be a pale yellow or almost colorless. . Get rest. . Taking a steamy shower or using a humidifier may help nasal congestion and ease sore throat pain. Using a saline nasal spray works much the same way. . Cough drops, hard candies and sore throat lozenges may ease your cough. . Line up a caregiver. Have someone check on you regularly.   GET HELP RIGHT AWAY IF: . You cannot keep down liquids or your medications. . You become short of breath . Your fell like you are going to pass out or loose consciousness. . Your symptoms persist after you have completed your treatment plan MAKE SURE YOU   Understand these instructions.  Will watch your condition.  Will get help right away if you are not doing well or get worse.  Your e-visit answers were reviewed by a board certified advanced clinical practitioner to complete your personal care plan.  Depending on the condition, your plan could have included both over the counter or prescription medications.  If there is a problem  please reply  once you have received a response from your provider.  Your safety is important to [place].  If you have drug allergies check your prescription carefully.    You can use MyChart to ask questions about today's visit, request a non-urgent call back, or ask for a work or school excuse for 24 hours related to this e-Visit. If it has been greater than 24 hours you will need to follow up with your provider, or enter a new e-Visit to address those concerns.  You will get an e-mail in the next two days asking about your experience.  I hope  that your e-visit has been valuable and will speed your recovery. Thank you for using e-visits.

## 2018-04-18 NOTE — Telephone Encounter (Signed)
Pt treated with Tamiflu through e-visit. She has a f/u scheduled in April

## 2018-04-27 DIAGNOSIS — M25561 Pain in right knee: Secondary | ICD-10-CM | POA: Insufficient documentation

## 2018-04-27 DIAGNOSIS — M25511 Pain in right shoulder: Secondary | ICD-10-CM | POA: Insufficient documentation

## 2018-04-27 DIAGNOSIS — M25562 Pain in left knee: Secondary | ICD-10-CM | POA: Insufficient documentation

## 2018-05-07 ENCOUNTER — Encounter: Payer: Self-pay | Admitting: Internal Medicine

## 2018-05-07 MED ORDER — METOPROLOL TARTRATE 50 MG PO TABS: 50.0000 mg | ORAL_TABLET | Freq: Every day | ORAL | 1 refills | Status: DC | PRN

## 2018-05-07 NOTE — Telephone Encounter (Signed)
rx sent in for metoprolol #30 with one refill.

## 2018-05-07 NOTE — Telephone Encounter (Signed)
Are you okay with sending in her metoprolol. I do not see where it has been refilled since 2017. She take prn. Does not want to run out. She has an OV scheduled in April.

## 2018-05-22 ENCOUNTER — Telehealth: Payer: Self-pay

## 2018-05-22 NOTE — Telephone Encounter (Signed)
Copied from CRM [id number]. Topic: Appointment Scheduling - Scheduling Inquiry for Clinic >> May 22, 2018 12:09 PM Reggie Pile, [place] wrote: Patient called and wanted to reschedule her upcoming appointment for her physical. Best call back is [phone] or [phone]

## 2018-05-25 ENCOUNTER — Encounter: Payer: Managed Care, Other (non HMO) | Admitting: Internal Medicine

## 2018-06-11 ENCOUNTER — Encounter: Payer: Self-pay | Admitting: *Deleted

## 2018-07-09 DIAGNOSIS — M25532 Pain in left wrist: Secondary | ICD-10-CM | POA: Insufficient documentation

## 2018-07-26 ENCOUNTER — Other Ambulatory Visit: Payer: Self-pay

## 2018-07-31 ENCOUNTER — Encounter: Payer: Self-pay | Admitting: Internal Medicine

## 2018-07-31 ENCOUNTER — Other Ambulatory Visit: Payer: Self-pay

## 2018-07-31 ENCOUNTER — Ambulatory Visit (INDEPENDENT_AMBULATORY_CARE_PROVIDER_SITE_OTHER): Payer: Managed Care, Other (non HMO) | Admitting: Internal Medicine

## 2018-07-31 VITALS — BP 128/78 | HR 64 | Temp 98.8°F | Resp 16 | Wt 196.6 lb

## 2018-07-31 DIAGNOSIS — Z1231 Encounter for screening mammogram for malignant neoplasm of breast: Secondary | ICD-10-CM

## 2018-07-31 DIAGNOSIS — E7800 Pure hypercholesterolemia, unspecified: Secondary | ICD-10-CM | POA: Diagnosis not present

## 2018-07-31 DIAGNOSIS — F439 Reaction to severe stress, unspecified: Secondary | ICD-10-CM

## 2018-07-31 DIAGNOSIS — K219 Gastro-esophageal reflux disease without esophagitis: Secondary | ICD-10-CM

## 2018-07-31 DIAGNOSIS — M4800 Spinal stenosis, site unspecified: Secondary | ICD-10-CM

## 2018-07-31 DIAGNOSIS — D509 Iron deficiency anemia, unspecified: Secondary | ICD-10-CM | POA: Diagnosis not present

## 2018-07-31 DIAGNOSIS — R000 Tachycardia, unspecified: Secondary | ICD-10-CM

## 2018-07-31 DIAGNOSIS — E039 Hypothyroidism, unspecified: Secondary | ICD-10-CM

## 2018-07-31 DIAGNOSIS — Z23 Encounter for immunization: Secondary | ICD-10-CM

## 2018-07-31 DIAGNOSIS — R002 Palpitations: Secondary | ICD-10-CM

## 2018-07-31 DIAGNOSIS — Z0000 Encounter for general adult medical examination without abnormal findings: Secondary | ICD-10-CM | POA: Diagnosis not present

## 2018-07-31 NOTE — Assessment & Plan Note (Addendum)
Physical today 07/31/18.  PAP 09/2014 - negative with negative HPV.  Mammogram 07/05/16 - Birads I.  Schedule f/u mammogram - overdue.  Colonoscopy 08/2013.  Due 08/2018.  Refer back to GI.

## 2018-07-31 NOTE — Progress Notes (Signed)
Patient ID: Sydney Anderson, female   DOB: August 25, 1950, 68 y.o.   MRN: [id number]   Subjective:    Patient ID: Sydney Anderson, female    DOB: 07-24-1950, 68 y.o.   MRN: [id number]  HPI  Patient here for her physical exam.  Reports she is doing relatively well.  Still with increased stress with her husband's health issues.  Also increased stress with the covid pandemic and "other issues going on in the country".  Discussed with her today.  She does not feel she needs any further intervention at this time. She is continue to exercise as tolerated.  No chest pain.  Breathing stable.  No acid reflux.  No abdominal pain.  Bowels moving.  Due colonoscopy.  Does report some intermittent heart palpitations.  No chest pain.  No chest congestion or cough.     Past Medical History:  Diagnosis Date   Arthritis    Asthma    Depression    Diverticulitis    GERD (gastroesophageal reflux disease)    Hypertension    Hypothyroidism    Kidney stones    Migraines    Past Surgical History:  Procedure Laterality Date   accident  2005   EYE SURGERY  2010,2011   FOOT SURGERY  2006   kidney stones  1980   SHOULDER ADHESION RELEASE  2008   Family History  Problem Relation Age of Onset   Arthritis Mother    Lung cancer Mother    Mental illness Mother    Colon cancer Father    Mental illness Father    Heart disease Maternal Grandfather    Stroke Maternal Grandfather    Breast cancer Paternal Grandmother 68   Social History   Socioeconomic History   Marital status: Married    Spouse name: Not on file   Number of children: Not on file   Years of education: Not on file   Highest education level: Not on file  Occupational History   Not on file  Social Needs   Financial resource strain: Not on file   Food insecurity    Worry: Not on file    Inability: Not on file   Transportation needs    Medical: Not on file    Non-medical: Not on file  Tobacco Use     Smoking status: Former Smoker   Smokeless tobacco: Former Systems developer    Quit date: 12/05/2007  Substance and Sexual Activity   Alcohol use: No    Alcohol/week: 0.0 standard drinks   Drug use: No   Sexual activity: Not on file  Lifestyle   Physical activity    Days per week: Not on file    Minutes per session: Not on file   Stress: Not on file  Relationships   Social connections    Talks on phone: Not on file    Gets together: Not on file    Attends religious service: Not on file    Active member of club or organization: Not on file    Attends meetings of clubs or organizations: Not on file    Relationship status: Not on file  Other Topics Concern   Not on file  Social History Narrative   Not on file    Outpatient Encounter Medications as of 07/31/2018  Medication Sig   aspirin 81 MG tablet Take 81 mg by mouth daily.   atropine 1 % ophthalmic solution    Calcium Citrate-Vitamin D (CALCIUM + D PO) Take by mouth.  FLUoxetine (PROZAC) 10 MG tablet Take 1 tablet (10 mg total) by mouth daily.   levothyroxine (SYNTHROID, LEVOTHROID) 50 MCG tablet Take 1 tablet (50 mcg total) by mouth daily.   MAGNESIUM PO Take by mouth.   methylcellulose (ARTIFICIAL TEARS) 1 % ophthalmic solution Place 1 drop into both eyes as needed.   metoprolol tartrate (LOPRESSOR) 50 MG tablet Take 1 tablet (50 mg total) by mouth daily as needed.   prednisoLONE acetate (PRED FORTE) 1 % ophthalmic suspension APPLY 1 DROP TO THE RIGHT EYE 4 TIMES A DAY STARTING 2 DAYS BEFORE LASER TREATMENT   ranitidine (ZANTAC) 150 MG capsule Take 150 mg by mouth daily as needed.    sodium chloride (OCEAN) 0.65 % nasal spray Place 1 spray into the nose as needed.   VOLTAREN 1 % GEL    [DISCONTINUED] oseltamivir (TAMIFLU) 75 MG capsule Take 1 capsule (75 mg total) by mouth 2 (two) times daily.   No facility-administered encounter medications on file as of 07/31/2018.     Review of Systems  Constitutional:  Negative for appetite change and unexpected weight change.  HENT: Negative for congestion and sinus pressure.   Eyes: Negative for pain and visual disturbance.  Respiratory: Negative for cough, chest tightness and shortness of breath.   Cardiovascular: Positive for palpitations. Negative for chest pain and leg swelling.  Gastrointestinal: Negative for abdominal pain, diarrhea, nausea and vomiting.  Genitourinary: Negative for difficulty urinating and dysuria.  Musculoskeletal: Negative for joint swelling and myalgias.  Skin: Negative for color change and rash.  Neurological: Negative for dizziness, light-headedness and headaches.  Hematological: Negative for adenopathy. Does not bruise/bleed easily.  Psychiatric/Behavioral: Negative for agitation and dysphoric mood.       Objective:    Physical Exam Constitutional:      General: She is not in acute distress.    Appearance: Normal appearance. She is well-developed.  HENT:     Right Ear: External ear normal.     Left Ear: External ear normal.  Eyes:     General: No scleral icterus.       Right eye: No discharge.        Left eye: No discharge.     Conjunctiva/sclera: Conjunctivae normal.  Neck:     Musculoskeletal: Neck supple. No muscular tenderness.     Thyroid: No thyromegaly.  Cardiovascular:     Rate and Rhythm: Normal rate and regular rhythm.  Pulmonary:     Effort: No tachypnea, accessory muscle usage or respiratory distress.     Breath sounds: Normal breath sounds. No decreased breath sounds or wheezing.  Chest:     Breasts:        Right: No inverted nipple, mass, nipple discharge or tenderness (no axillary adenopathy).        Left: No inverted nipple, mass, nipple discharge or tenderness (no axilarry adenopathy).  Abdominal:     General: Bowel sounds are normal.     Palpations: Abdomen is soft.     Tenderness: There is no abdominal tenderness.  Musculoskeletal:        General: No swelling or tenderness.    Lymphadenopathy:     Cervical: No cervical adenopathy.  Skin:    Findings: No erythema or rash.  Neurological:     Mental Status: She is alert and oriented to person, place, and time.  Psychiatric:        Mood and Affect: Mood normal.        Behavior: Behavior normal.  BP 128/78    Pulse 64    Temp 98.8 F (37.1 C) (Oral)    Resp 16    Wt 196 lb 9.6 oz (89.2 kg)    SpO2 97%    BMI 31.73 kg/m  Wt Readings from Last 3 Encounters:  07/31/18 196 lb 9.6 oz (89.2 kg)  07/26/17 190 lb (86.2 kg)  07/04/17 192 lb 12.8 oz (87.5 kg)     Lab Results  Component Value Date   WBC 6.6 07/26/2017   HGB 14.6 07/26/2017   HCT 43.7 07/26/2017   PLT 265 07/26/2017   GLUCOSE 97 07/26/2017   CHOL 175 07/26/2017   TRIG 75 07/26/2017   HDL 53 07/26/2017   LDLCALC 107 (H) 07/26/2017   ALT 32 07/26/2017   AST 25 07/26/2017   NA 142 07/26/2017   K 4.9 07/26/2017   CL 105 07/26/2017   CREATININE 0.70 07/26/2017   BUN 11 07/26/2017   CO2 22 07/26/2017   TSH 2.240 07/26/2017   INR 1.0 11/07/2012    [place] Abdomen Complete  Result Date: 02/23/2017 CLINICAL DATA:  Abnormal liver function tests. EXAM: ABDOMEN ULTRASOUND COMPLETE COMPARISON:  MRI 06/23/2014. FINDINGS: Gallbladder: No gallstones or wall thickening visualized. No sonographic Murphy sign noted by sonographer. Common bile duct: Diameter: 4.5 mm Liver: Increased echogenicity consistent fatty infiltration and/or hepatocellular disease. Portal vein is patent on color Doppler imaging with normal direction of blood flow towards the liver. IVC: No abnormality visualized. Pancreas: Visualized portion unremarkable. Spleen: Size and appearance within normal limits. Right Kidney: Length: 10.0 cm. Echogenicity within normal limits. No mass or hydronephrosis visualized. Left Kidney: Length: 11.4 cm. Echogenicity within normal limits. No mass or hydronephrosis visualized. Abdominal aorta: No aneurysm visualized. Other findings: None. IMPRESSION: 1.  Increased hepatic echogenicity consistent with fatty infiltration and/or hepatocellular disease. 2.  No gallstones or biliary distention. Electronically Signed   By: Maisie Fushomas  Register   On: 02/23/2017 13:41       Assessment & Plan:   Problem List Items Addressed This Visit    Anemia    Has had GI w/up for iron deficient anemia.  Colonoscopy 08/2013.  Recommended f/u 08/2018.  Follow cbc. Arrange f/u with GI.        GERD (gastroesophageal reflux disease)    Upper symptoms controlled.        Health care maintenance    Physical today 07/31/18.  PAP 09/2014 - negative with negative HPV.  Mammogram 07/05/16 - Birads I.  Schedule f/u mammogram - overdue.  Colonoscopy 08/2013.  Due 08/2018.  Refer back to GI.       Hypercholesteremia    Low cholesterol diet and exercise.  Follow lipid panel.        Hypothyroidism    On thyroid replacement.  Follow tsh.       Palpitations    EKG - SR with no acute ischemic changes.  She feels overall stable.  Discussed further w/up including monitor.  Discussed importance of determining rhythm when episodes occur.  Discussed risks.  She declines further evaluation or w/up.  Will notify me if she changes her mind of if she has any worsening problems.        Spinal stenosis    Has seen ortho and Dr Yves Dillhasnis.  Saw a spine specialist.  Does have persistent pain.  Discussed with her today.  She declines any further evaluation or treatment.        Stress    Increased stress as outlined.  Discussed with her today.  She declines any further evaluation.  Follow.        Other Visit Diagnoses    Visit for screening mammogram    -  Primary   Relevant Orders   MM 3D SCREEN BREAST BILATERAL   Tachycardia       Relevant Orders   EKG 12-Lead (Completed)   Need for vaccination with 13-polyvalent pneumococcal conjugate vaccine       Relevant Orders   Pneumococcal conjugate vaccine 13-valent (Completed)       Dale Durhamharlene Mikias Lanz, MD

## 2018-08-05 ENCOUNTER — Encounter: Payer: Self-pay | Admitting: Internal Medicine

## 2018-08-05 DIAGNOSIS — R002 Palpitations: Secondary | ICD-10-CM | POA: Insufficient documentation

## 2018-08-05 NOTE — Assessment & Plan Note (Signed)
Low cholesterol diet and exercise.  Follow lipid panel.   

## 2018-08-05 NOTE — Assessment & Plan Note (Signed)
Has seen ortho and Dr Sharlet Salina.  Saw a spine specialist.  Does have persistent pain.  Discussed with her today.  She declines any further evaluation or treatment.

## 2018-08-05 NOTE — Assessment & Plan Note (Signed)
On thyroid replacement.  Follow tsh.  

## 2018-08-05 NOTE — Assessment & Plan Note (Signed)
Increased stress as outlined.  Discussed with her today.  She declines any further evaluation.  Follow.

## 2018-08-05 NOTE — Assessment & Plan Note (Signed)
Upper symptoms controlled.  

## 2018-08-05 NOTE — Assessment & Plan Note (Signed)
EKG - SR with no acute ischemic changes.  She feels overall stable.  Discussed further w/up including monitor.  Discussed importance of determining rhythm when episodes occur.  Discussed risks.  She declines further evaluation or w/up.  Will notify me if she changes her mind of if she has any worsening problems.

## 2018-08-05 NOTE — Assessment & Plan Note (Signed)
Has had GI w/up for iron deficient anemia.  Colonoscopy 08/2013.  Recommended f/u 08/2018.  Follow cbc. Arrange f/u with GI.

## 2018-08-17 ENCOUNTER — Encounter: Payer: Self-pay | Admitting: Internal Medicine

## 2018-08-17 NOTE — Telephone Encounter (Signed)
Please fax rx for labs to fax number in my chart message.  Thanks

## 2018-08-21 NOTE — Telephone Encounter (Signed)
faxed

## 2018-08-22 ENCOUNTER — Other Ambulatory Visit: Payer: Managed Care, Other (non HMO)

## 2018-08-22 ENCOUNTER — Other Ambulatory Visit: Payer: Self-pay

## 2018-08-22 ENCOUNTER — Ambulatory Visit: Payer: Managed Care, Other (non HMO) | Admitting: Adult Health

## 2018-08-22 ENCOUNTER — Encounter: Payer: Self-pay | Admitting: Adult Health

## 2018-08-22 VITALS — BP 124/80 | HR 63 | Temp 98.2°F | Resp 16 | Ht 66.0 in | Wt 195.0 lb

## 2018-08-22 DIAGNOSIS — E039 Hypothyroidism, unspecified: Secondary | ICD-10-CM

## 2018-08-22 DIAGNOSIS — Z008 Encounter for other general examination: Secondary | ICD-10-CM

## 2018-08-22 DIAGNOSIS — E7800 Pure hypercholesterolemia, unspecified: Secondary | ICD-10-CM

## 2018-08-22 DIAGNOSIS — D509 Iron deficiency anemia, unspecified: Secondary | ICD-10-CM | POA: Insufficient documentation

## 2018-08-22 DIAGNOSIS — D649 Anemia, unspecified: Secondary | ICD-10-CM

## 2018-08-22 DIAGNOSIS — Z0189 Encounter for other specified special examinations: Secondary | ICD-10-CM

## 2018-08-22 NOTE — Patient Instructions (Signed)
Health Maintenance, Female Adopting a healthy lifestyle and getting preventive care are important in promoting health and wellness. Ask your health care provider about:  The right schedule for you to have regular tests and exams.  Things you can do on your own to prevent diseases and keep yourself healthy. What should I know about diet, weight, and exercise? Eat a healthy diet   Eat a diet that includes plenty of vegetables, fruits, low-fat dairy products, and lean protein.  Do not eat a lot of foods that are high in solid fats, added sugars, or sodium. Maintain a healthy weight Body mass index (BMI) is used to identify weight problems. It estimates body fat based on height and weight. Your health care provider can help determine your BMI and help you achieve or maintain a healthy weight. Get regular exercise Get regular exercise. This is one of the most important things you can do for your health. Most adults should:  Exercise for at least 150 minutes each week. The exercise should increase your heart rate and make you sweat (moderate-intensity exercise).  Do strengthening exercises at least twice a week. This is in addition to the moderate-intensity exercise.  Spend less time sitting. Even light physical activity can be beneficial. Watch cholesterol and blood lipids Have your blood tested for lipids and cholesterol at 66 years of age, then have this test every 5 years. Have your cholesterol levels checked more often if:  Your lipid or cholesterol levels are high.  You are older than 66 years of age.  You are at high risk for heart disease. What should I know about cancer screening? Depending on your health history and family history, you may need to have cancer screening at various ages. This may include screening for:  Breast cancer.  Cervical cancer.  Colorectal cancer.  Skin cancer.  Lung cancer. What should I know about heart disease, diabetes, and high blood  pressure? Blood pressure and heart disease  High blood pressure causes heart disease and increases the risk of stroke. This is more likely to develop in people who have high blood pressure readings, are of African descent, or are overweight.  Have your blood pressure checked: ? Every 3-5 years if you are 18-39 years of age. ? Every year if you are 40 years old or older. Diabetes Have regular diabetes screenings. This checks your fasting blood sugar level. Have the screening done:  Once every three years after age 40 if you are at a normal weight and have a low risk for diabetes.  More often and at a younger age if you are overweight or have a high risk for diabetes. What should I know about preventing infection? Hepatitis B If you have a higher risk for hepatitis B, you should be screened for this virus. Talk with your health care provider to find out if you are at risk for hepatitis B infection. Hepatitis C Testing is recommended for:  Everyone born from 1945 through 1965.  Anyone with known risk factors for hepatitis C. Sexually transmitted infections (STIs)  Get screened for STIs, including gonorrhea and chlamydia, if: ? You are sexually active and are younger than 66 years of age. ? You are older than 66 years of age and your health care provider tells you that you are at risk for this type of infection. ? Your sexual activity has changed since you were last screened, and you are at increased risk for chlamydia or gonorrhea. Ask your health care provider if   you are at risk.  Ask your health care provider about whether you are at high risk for HIV. Your health care provider may recommend a prescription medicine to help prevent HIV infection. If you choose to take medicine to prevent HIV, you should first get tested for HIV. You should then be tested every 3 months for as long as you are taking the medicine. Pregnancy  If you are about to stop having your period (premenopausal) and  you may become pregnant, seek counseling before you get pregnant.  Take 400 to 800 micrograms (mcg) of folic acid every day if you become pregnant.  Ask for birth control (contraception) if you want to prevent pregnancy. Osteoporosis and menopause Osteoporosis is a disease in which the bones lose minerals and strength with aging. This can result in bone fractures. If you are 65 years old or older, or if you are at risk for osteoporosis and fractures, ask your health care provider if you should:  Be screened for bone loss.  Take a calcium or vitamin D supplement to lower your risk of fractures.  Be given hormone replacement therapy (HRT) to treat symptoms of menopause. Follow these instructions at home: Lifestyle  Do not use any products that contain nicotine or tobacco, such as cigarettes, e-cigarettes, and chewing tobacco. If you need help quitting, ask your health care provider.  Do not use street drugs.  Do not share needles.  Ask your health care provider for help if you need support or information about quitting drugs. Alcohol use  Do not drink alcohol if: ? Your health care provider tells you not to drink. ? You are pregnant, may be pregnant, or are planning to become pregnant.  If you drink alcohol: ? Limit how much you use to 0-1 drink a day. ? Limit intake if you are breastfeeding.  Be aware of how much alcohol is in your drink. In the [place] one drink equals one 12 oz bottle of beer (355 mL), one 5 oz glass of wine (148 mL), or one 1 oz glass of hard liquor (44 mL). General instructions  Schedule regular health, dental, and eye exams.  Stay current with your vaccines.  Tell your health care provider if: ? You often feel depressed. ? You have ever been abused or do not feel safe at home. Summary  Adopting a healthy lifestyle and getting preventive care are important in promoting health and wellness.  Follow your health care provider's instructions about healthy  diet, exercising, and getting tested or screened for diseases.  Follow your health care provider's instructions on monitoring your cholesterol and blood pressure. This information is not intended to replace advice given to you by your health care provider. Make sure you discuss any questions you have with your health care provider. Document Released: 08/09/2010 Document Revised: 01/17/2018 Document Reviewed: 01/17/2018 Elsevier Patient Education  2020 [employer]  

## 2018-08-22 NOTE — Progress Notes (Addendum)
[facility]  Sydney Anderson DOB: 68 y.o. MRN: [id number]  Subjective:  Here for Biometric Screen/brief exam  Patient is here for a biometric brief exam and screening.  She reports that Dr. Janae Bridgeman is her primary care and she just recently had a physical with her to keep up-to-date with her female health maintenance. Patient denies any concerns at this time. 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  She is followed by ophthalmology for this and her PCP she denies any new symptoms.  Objective: Blood pressure 124/80, pulse 63, temperature 98.2 F (36.8 C), temperature source Oral, resp. rate 16, height 5\' 6"  (1.676 m), weight 195 lb (88.5 kg), SpO2 96 %. NAD Patient is alert and oriented and responsive to questions Engages in eye contact with provider. Speaks in full sentences without any pauses without any shortness of breath or distress.   HEENT: Within normal limits Neck: Normal, supple, no lympadeopathy Heart: Regular rate and rhythm Lungs: Clear to auscultation with no adventitious lung sounds. Patient moves on and off of exam table and in room without a cane in no difficulty. Gait is normal in hall and in room. Patient is oriented to person place time and situation. Patient answers questions appropriately and engages in conversation.   Assessment: Biometric screen   Plan: Follow up with Einar Pheasant, MD as recommended.   An After Visit Summary was printed and given to the patient.  Fasting glucose and lipids. Discussed with patient that today's visit here is a limited biometric screening visit (not a comprehensive exam or management of any chronic problems) Discussed some health issues, including healthy eating habits and  exercise. Encouraged to follow-up with PCP for annual comprehensive preventive and wellness care (and if applicable, any chronic issues). Questions invited and answered.  Follow up with primary care as needed for chronic and maintenance health care- can be seen in this employee clinic for acute care.

## 2018-08-23 LAB — CBC WITH DIFFERENTIAL/PLATELET
Basophils Absolute: 0.1 10*3/uL (ref 0.0–0.2)
Basos: 1 %
EOS (ABSOLUTE): 0.2 10*3/uL (ref 0.0–0.4)
Eos: 3 %
Hematocrit: 43.4 % (ref 34.0–46.6)
Hemoglobin: 15 g/dL (ref 11.1–15.9)
Immature Grans (Abs): 0 10*3/uL (ref 0.0–0.1)
Immature Granulocytes: 0 %
Lymphocytes Absolute: 2.1 10*3/uL (ref 0.7–3.1)
Lymphs: 30 %
MCH: 31 pg (ref 26.6–33.0)
MCHC: 34.6 g/dL (ref 31.5–35.7)
MCV: 90 fL (ref 79–97)
Monocytes Absolute: 0.3 10*3/uL (ref 0.1–0.9)
Monocytes: 5 %
Neutrophils Absolute: 4.1 10*3/uL (ref 1.4–7.0)
Neutrophils: 61 %
Platelets: 211 10*3/uL (ref 150–450)
RBC: 4.84 x10E6/uL (ref 3.77–5.28)
RDW: 13.1 % (ref 11.7–15.4)
WBC: 6.8 10*3/uL (ref 3.4–10.8)

## 2018-08-23 LAB — LIPID PANEL
Chol/HDL Ratio: 3.3 ratio (ref 0.0–4.4)
Cholesterol, Total: 188 mg/dL (ref 100–199)
HDL: 57 mg/dL (ref >39)
LDL Calculated: 113 mg/dL — ABNORMAL HIGH (ref 0–99)
Triglycerides: 92 mg/dL (ref 0–149)
VLDL Cholesterol Cal: 18 mg/dL (ref 5–40)

## 2018-08-23 LAB — COMPREHENSIVE METABOLIC PANEL
ALT: 53 IU/L — ABNORMAL HIGH (ref 0–32)
AST: 36 IU/L (ref 0–40)
Albumin/Globulin Ratio: 2.3 — ABNORMAL HIGH (ref 1.2–2.2)
Albumin: 4.6 g/dL (ref 3.8–4.8)
Alkaline Phosphatase: 137 IU/L — ABNORMAL HIGH (ref 39–117)
BUN/Creatinine Ratio: 17 (ref 12–28)
BUN: 10 mg/dL (ref 8–27)
Bilirubin Total: 0.6 mg/dL (ref 0.0–1.2)
CO2: 20 mmol/L (ref 20–29)
Calcium: 9 mg/dL (ref 8.7–10.3)
Chloride: 102 mmol/L (ref 96–106)
Creatinine, Ser: 0.59 mg/dL (ref 0.57–1.00)
GFR calc Af Amer: 110 mL/min/{1.73_m2} (ref >59)
GFR calc non Af Amer: 96 mL/min/{1.73_m2} (ref >59)
Globulin, Total: 2 g/dL (ref 1.5–4.5)
Glucose: 98 mg/dL (ref 65–99)
Potassium: 4.3 mmol/L (ref 3.5–5.2)
Sodium: 139 mmol/L (ref 134–144)
Total Protein: 6.6 g/dL (ref 6.0–8.5)

## 2018-08-23 LAB — TSH: TSH: 3.71 u[IU]/mL (ref 0.450–4.500)

## 2018-08-23 LAB — HEPATIC FUNCTION PANEL: Bilirubin, Direct: 0.2 mg/dL (ref 0.00–0.40)

## 2018-09-04 ENCOUNTER — Encounter: Payer: Self-pay | Admitting: Internal Medicine

## 2018-09-05 NOTE — Telephone Encounter (Signed)
rx for labs placed on box.

## 2018-09-06 ENCOUNTER — Encounter: Payer: Self-pay | Admitting: Internal Medicine

## 2018-09-06 DIAGNOSIS — R7989 Other specified abnormal findings of blood chemistry: Secondary | ICD-10-CM | POA: Insufficient documentation

## 2018-09-06 DIAGNOSIS — R945 Abnormal results of liver function studies: Secondary | ICD-10-CM | POA: Insufficient documentation

## 2018-10-08 ENCOUNTER — Other Ambulatory Visit: Payer: Self-pay

## 2018-10-08 ENCOUNTER — Encounter: Payer: Self-pay | Admitting: Internal Medicine

## 2018-10-08 MED ORDER — FLUOXETINE HCL 10 MG PO TABS: 10.0000 mg | ORAL_TABLET | Freq: Every day | ORAL | 5 refills | Status: DC

## 2018-10-08 MED ORDER — METOPROLOL TARTRATE 50 MG PO TABS: 50.0000 mg | ORAL_TABLET | Freq: Every day | ORAL | 1 refills | Status: DC | PRN

## 2018-10-08 MED ORDER — LEVOTHYROXINE SODIUM 50 MCG PO TABS: 50.0000 ug | ORAL_TABLET | Freq: Every day | ORAL | 1 refills | Status: DC

## 2018-10-18 ENCOUNTER — Other Ambulatory Visit: Payer: Self-pay

## 2018-10-18 ENCOUNTER — Other Ambulatory Visit: Payer: Managed Care, Other (non HMO)

## 2018-10-18 DIAGNOSIS — R945 Abnormal results of liver function studies: Secondary | ICD-10-CM

## 2018-10-19 LAB — HEPATIC FUNCTION PANEL
ALT: 63 IU/L — ABNORMAL HIGH (ref 0–32)
AST: 48 IU/L — ABNORMAL HIGH (ref 0–40)
Albumin: 4.3 g/dL (ref 3.8–4.8)
Alkaline Phosphatase: 139 IU/L — ABNORMAL HIGH (ref 39–117)
Bilirubin Total: 0.5 mg/dL (ref 0.0–1.2)
Bilirubin, Direct: 0.18 mg/dL (ref 0.00–0.40)
Total Protein: 6.5 g/dL (ref 6.0–8.5)

## 2018-11-02 ENCOUNTER — Ambulatory Visit
Admission: RE | Admit: 2018-11-02 | Discharge: 2018-11-02 | Disposition: A | Discharge status: Home / Self Care | Payer: Managed Care, Other (non HMO) | Source: Ambulatory Visit | Attending: Internal Medicine | Admitting: Internal Medicine

## 2018-11-02 DIAGNOSIS — Z1231 Encounter for screening mammogram for malignant neoplasm of breast: Secondary | ICD-10-CM | POA: Diagnosis not present

## 2018-12-04 ENCOUNTER — Ambulatory Visit: Payer: Managed Care, Other (non HMO) | Admitting: Internal Medicine

## 2019-01-01 ENCOUNTER — Ambulatory Visit: Payer: Managed Care, Other (non HMO) | Admitting: Internal Medicine

## 2019-01-08 ENCOUNTER — Encounter: Payer: Self-pay | Admitting: Adult Health

## 2019-01-08 NOTE — Telephone Encounter (Signed)
Ok to schedule doxy.  Please schedule her for doxy appt.

## 2019-01-08 NOTE — Telephone Encounter (Signed)
Duplicate.  See attached.  Ok to schedule doxy appt.

## 2019-01-10 NOTE — Telephone Encounter (Signed)
LMTCB

## 2019-01-10 NOTE — Telephone Encounter (Signed)
Pt called back returning your call. I explained to pt how virtual works. Thanks

## 2019-01-11 ENCOUNTER — Other Ambulatory Visit: Payer: Self-pay

## 2019-01-11 ENCOUNTER — Ambulatory Visit (INDEPENDENT_AMBULATORY_CARE_PROVIDER_SITE_OTHER): Payer: Managed Care, Other (non HMO) | Admitting: Internal Medicine

## 2019-01-11 ENCOUNTER — Encounter: Payer: Self-pay | Admitting: Internal Medicine

## 2019-01-11 VITALS — Ht 66.5 in | Wt 195.0 lb

## 2019-01-11 DIAGNOSIS — E7800 Pure hypercholesterolemia, unspecified: Secondary | ICD-10-CM

## 2019-01-11 DIAGNOSIS — E039 Hypothyroidism, unspecified: Secondary | ICD-10-CM | POA: Diagnosis not present

## 2019-01-11 DIAGNOSIS — Z1211 Encounter for screening for malignant neoplasm of colon: Secondary | ICD-10-CM | POA: Diagnosis not present

## 2019-01-11 DIAGNOSIS — F439 Reaction to severe stress, unspecified: Secondary | ICD-10-CM

## 2019-01-11 DIAGNOSIS — D509 Iron deficiency anemia, unspecified: Secondary | ICD-10-CM

## 2019-01-11 DIAGNOSIS — R002 Palpitations: Secondary | ICD-10-CM

## 2019-01-11 NOTE — Progress Notes (Signed)
Patient ID: Sydney Anderson, female   DOB: June 06, 1950, 68 y.o.   MRN: [id number]   Virtual Visit via video Note  This visit type was conducted due to national recommendations for restrictions regarding the COVID-19 pandemic (e.g. social distancing).  This format is felt to be most appropriate for this patient at this time.  All issues noted in this document were discussed and addressed.  No physical exam was performed (except for noted visual exam findings with Video Visits).   I connected with Sydney Anderson by a video enabled telemedicine application and verified that I am speaking with the correct person using two identifiers. Location patient: home Location provider: work  Persons participating in the virtual visit: patient, provider  I discussed the limitations, risks, security and privacy concerns of performing an evaluation and management service by video and the availability of in person appointments.  The patient expressed understanding and agreed to proceed.   Reason for visit: scheduled follow up.   HPI: She reports she is doing relatively well.  Handling stress.  Still dealing with her husband's health issues.  She is still working.  She does not feel she needs any further intervention.  Trying to stay active.  Still exercising.  No chest pain.  No sob.  No acid reflux.  No abdominal pain.  Bowels moving.  Due colonoscopy.     ROS: See pertinent positives and negatives per HPI.  Past Medical History:  Diagnosis Date  . Arthritis   . Asthma   . Depression   . Diverticulitis   . GERD (gastroesophageal reflux disease)   . Hypertension   . Hypothyroidism   . Kidney stones   . Migraines     Past Surgical History:  Procedure Laterality Date  . accident  2005  . EYE SURGERY  2010,2011  . FOOT SURGERY  2006  . kidney stones  1980  . SHOULDER ADHESION RELEASE  2008    Family History  Problem Relation Age of Onset  . Arthritis Mother   . Lung cancer Mother   .  Mental illness Mother   . Colon cancer Father   . Mental illness Father   . Heart disease Maternal Grandfather   . Stroke Maternal Grandfather   . Breast cancer Paternal Grandmother 89    SOCIAL HX: reviewed.    Current Outpatient Medications:  .  Timolol Maleate 0.5 % (DAILY) SOLN, , Disp: , Rfl:  .  aspirin 81 MG tablet, Take 81 mg by mouth daily., Disp: , Rfl:  .  atropine 1 % ophthalmic solution, , Disp: , Rfl: 0 .  Calcium Citrate-Vitamin D (CALCIUM + D PO), Take by mouth., Disp: , Rfl:  .  FLUoxetine (PROZAC) 10 MG tablet, Take 1 tablet (10 mg total) by mouth daily., Disp: 30 tablet, Rfl: 5 .  levothyroxine (SYNTHROID) 50 MCG tablet, Take 1 tablet (50 mcg total) by mouth daily., Disp: 90 tablet, Rfl: 1 .  MAGNESIUM PO, Take by mouth., Disp: , Rfl:  .  methylcellulose (ARTIFICIAL TEARS) 1 % ophthalmic solution, Place 1 drop into both eyes as needed., Disp: , Rfl:  .  metoprolol tartrate (LOPRESSOR) 50 MG tablet, Take 1 tablet (50 mg total) by mouth daily as needed., Disp: 30 tablet, Rfl: 1 .  prednisoLONE acetate (PRED FORTE) 1 % ophthalmic suspension, APPLY 1 DROP TO THE RIGHT EYE 4 TIMES A DAY STARTING 2 DAYS BEFORE LASER TREATMENT, Disp: , Rfl: 0 .  ranitidine (ZANTAC) 150 MG capsule, Take 150  mg by mouth daily as needed. , Disp: , Rfl:  .  sodium chloride (OCEAN) 0.65 % nasal spray, Place 1 spray into the nose as needed., Disp: , Rfl:  .  VOLTAREN 1 % GEL, , Disp: , Rfl: 1  EXAM:  GENERAL: alert, oriented, appears well and in no acute distress  HEENT: atraumatic, conjunttiva clear, no obvious abnormalities on inspection of external nose and ears  NECK: normal movements of the head and neck  LUNGS: on inspection no signs of respiratory distress, breathing rate appears normal, no obvious gross SOB, gasping or wheezing  CV: no obvious cyanosis  PSYCH/NEURO: pleasant and cooperative, no obvious depression or anxiety, speech and thought processing grossly intact   ASSESSMENT AND PLAN:  Discussed the following assessment and plan:  Anemia Has had GI w/up for iron deficient anemia.  Colonoscopy 08/2013.  Recommended f/u 08/2018.  Due f/u.  Refer back to GI.  Follow cbc.   Hypercholesteremia Low cholesterol diet and exercise.  Follow lipid panel.    Hypothyroidism On thyroid replacement.  Follow tsh.   Palpitations No palpitations.  Doing well.  Follow.    Stress Increased stress.  Discussed with her today.  She feels she is doing well.  Handling things well.  Does not feel needs any further intervention.  Follow.      I discussed the assessment and treatment plan with the patient. The patient was provided an opportunity to ask questions and all were answered. The patient agreed with the plan and demonstrated an understanding of the instructions.   The patient was advised to call back or seek an in-person evaluation if the symptoms worsen or if the condition fails to improve as anticipated.   Sydney Hearne, MD

## 2019-01-13 ENCOUNTER — Encounter: Payer: Self-pay | Admitting: Internal Medicine

## 2019-01-13 NOTE — Assessment & Plan Note (Signed)
Has had GI w/up for iron deficient anemia.  Colonoscopy 08/2013.  Recommended f/u 08/2018.  Due f/u.  Refer back to GI.  Follow cbc.

## 2019-01-13 NOTE — Assessment & Plan Note (Signed)
On thyroid replacement.  Follow tsh.  

## 2019-01-13 NOTE — Assessment & Plan Note (Signed)
No palpitations.  Doing well.  Follow.

## 2019-01-13 NOTE — Assessment & Plan Note (Signed)
Increased stress.  Discussed with her today.  She feels she is doing well.  Handling things well.  Does not feel needs any further intervention.  Follow.

## 2019-01-13 NOTE — Assessment & Plan Note (Signed)
Low cholesterol diet and exercise.  Follow lipid panel.   

## 2019-01-14 ENCOUNTER — Telehealth: Payer: Self-pay | Admitting: Internal Medicine

## 2019-01-14 NOTE — Telephone Encounter (Signed)
Lm for pt to call back and schedule the following appts  Return in about 4 months (around 05/12/2019) for follow up appt.  Schedule fasting labs 1-2 days before f/u appt.

## 2019-04-16 ENCOUNTER — Other Ambulatory Visit: Payer: Self-pay

## 2019-04-16 ENCOUNTER — Encounter: Payer: Self-pay | Admitting: Internal Medicine

## 2019-04-16 MED ORDER — FLUOXETINE HCL 10 MG PO TABS: 10.0000 mg | ORAL_TABLET | Freq: Every day | ORAL | 5 refills | Status: DC

## 2019-04-16 MED ORDER — LEVOTHYROXINE SODIUM 50 MCG PO TABS: 50.0000 ug | ORAL_TABLET | Freq: Every day | ORAL | 1 refills | Status: DC

## 2019-07-03 ENCOUNTER — Encounter: Payer: Self-pay | Admitting: Emergency Medicine

## 2019-07-03 ENCOUNTER — Other Ambulatory Visit: Payer: Self-pay

## 2019-07-03 ENCOUNTER — Ambulatory Visit: Payer: Managed Care, Other (non HMO) | Admitting: Emergency Medicine

## 2019-07-03 VITALS — BP 116/60 | HR 63 | Temp 98.0°F | Resp 16 | Ht 68.0 in | Wt 195.0 lb

## 2019-07-03 DIAGNOSIS — Z008 Encounter for other general examination: Secondary | ICD-10-CM

## 2019-07-03 NOTE — Progress Notes (Signed)
I have reviewed the triage vital signs and the nursing notes.   HISTORY  Chief Complaint Biometrics  HPI Sydney Anderson is a 69 y.o. female  Presents to clinic for biometric yearly physical.  No new complaints.  She continues to wear a darkened shade to her right eye due to light sensitivity and retinal detachment.        Past Medical History:  Diagnosis Date  . Arthritis   . Asthma   . Depression   . Diverticulitis   . GERD (gastroesophageal reflux disease)   . Hypertension   . Hypothyroidism   . Kidney stones   . Migraines     Patient Active Problem List   Diagnosis Date Noted  . Abnormal liver function tests 09/06/2018  . IDA (iron deficiency anemia) 08/22/2018  . Palpitations 08/05/2018  . Left wrist pain 07/09/2018  . Right shoulder pain 04/27/2018  . Left knee pain 04/27/2018  . Osteoporosis 05/03/2015  . Vertebral compression fracture ([place]) 03/15/2015  . Pain of left thumb 03/15/2015  . Lumbar radiculitis 12/11/2014  . Lumbar stenosis with neurogenic claudication 09/09/2014  . Health care maintenance 09/09/2014  . Chronic hip pain, left 12/14/2013  . Anemia 09/08/2013  . Right ankle pain 09/08/2013  . Tongue lesion 09/08/2013  . Fatigue 06/04/2013  . Chronic lower back pain 02/07/2013  . Stress 01/05/2013  . Right hip pain 01/05/2013  . Hypothyroidism 12/06/2011  . Hypercholesteremia 12/06/2011  . GERD (gastroesophageal reflux disease) 12/06/2011    Past Surgical History:  Procedure Laterality Date  . accident  2005  . EYE SURGERY  2010,2011  . FOOT SURGERY  2006  . kidney stones  1980  . SHOULDER ADHESION RELEASE  2008    Prior to Admission medications   Medication Sig Start Date End Date Taking? Authorizing Provider  aspirin 81 MG tablet Take 81 mg by mouth daily.    [provider]  atropine 1 % ophthalmic solution  11/02/16   [provider]  Calcium Citrate-Vitamin D (CALCIUM + D PO) Take by mouth.     [provider]  FLUoxetine (PROZAC) 10 MG tablet Take 1 tablet (10 mg total) by mouth daily. 04/16/19   Einar Pheasant, MD  levothyroxine (SYNTHROID) 50 MCG tablet Take 1 tablet (50 mcg total) by mouth daily. 04/16/19   Einar Pheasant, MD  MAGNESIUM PO Take by mouth.    [provider]  methylcellulose (ARTIFICIAL TEARS) 1 % ophthalmic solution Place 1 drop into both eyes as needed.    [provider]  metoprolol tartrate (LOPRESSOR) 50 MG tablet Take 1 tablet (50 mg total) by mouth daily as needed. 10/08/18   Einar Pheasant, MD  prednisoLONE acetate (PRED FORTE) 1 % ophthalmic suspension APPLY 1 DROP TO THE RIGHT EYE 4 TIMES A DAY STARTING 2 DAYS BEFORE LASER TREATMENT 10/31/16   [provider]  ranitidine (ZANTAC) 150 MG capsule Take 150 mg by mouth daily as needed.     [provider]  sodium chloride (OCEAN) 0.65 % nasal spray Place 1 spray into the nose as needed.    [provider]  Timolol Maleate 0.5 % (DAILY) SOLN  04/24/18   [provider]  VOLTAREN 1 % GEL  09/01/14   [provider]    Allergies Horse-derived products and Tetanus toxoids  Family History  Problem Relation Age of Onset  . Arthritis Mother   . Lung cancer Mother   . Mental illness Mother   .  Colon cancer Father   . Mental illness Father   . Heart disease Maternal Grandfather   . Stroke Maternal Grandfather   . Breast cancer Paternal Grandmother 25    Social History Social History   Tobacco Use  . Smoking status: Former Games developer  . Smokeless tobacco: Former Neurosurgeon    Quit date: 12/05/2007  Substance Use Topics  . Alcohol use: No    Alcohol/week: 0.0 standard drinks  . Drug use: No    Review of Systems Constitutional: No fever/chills Eyes: No visual changes. Hx of retinal detachment right eye. ENT: No complaints Cardiovascular: Denies chest pain. Respiratory: Denies shortness of breath. Gastrointestinal: No abdominal pain.    Musculoskeletal: Negative for joint pain. Skin: Negative for rash. Neurological: Negative for headaches, focal weakness or numbness. ____________________________________________   PHYSICAL EXAM: Constitutional: Alert and oriented. Well appearing and in no acute distress. Head: Atraumatic. Nose: No congestion/rhinnorhea. Mouth/Throat: Mucous membranes are moist.  Oropharynx non-erythematous. Neck: No stridor.  Non-tender cervical spine to palpation posteriorly. Cardiovascular: Normal rate, regular rhythm. Grossly normal heart sounds.  Good peripheral circulation. Respiratory: Normal respiratory effort.  No retractions. Lungs CTAB. Gastrointestinal: Soft and nontender. No distention. Musculoskeletal: No lower extremity tenderness nor edema.  No joint effusions. No edema lower extremity bilateral.   Neurologic:  Normal speech and language. No gross focal neurologic deficits are appreciated. No gait, walks with a cane.  Skin:  Skin is warm, dry and intact. No rash noted. Psychiatric: Mood and affect are normal. Speech and behavior are normal.   FINAL CLINICAL IMPRESSION(S)   Annual biometric physical  Labs pending   ED Discharge Orders         Ordered    Lipid panel     Pending    Glucose, random     Pending           Note:  This document was prepared using Dragon voice recognition software and may include unintentional dictation errors.

## 2019-07-04 ENCOUNTER — Encounter: Payer: Self-pay | Admitting: Internal Medicine

## 2019-07-04 LAB — LIPID PANEL
Chol/HDL Ratio: 3 ratio (ref 0.0–4.4)
Cholesterol, Total: 176 mg/dL (ref 100–199)
HDL: 58 mg/dL (ref >39)
LDL Chol Calc (NIH): 104 mg/dL — ABNORMAL HIGH (ref 0–99)
Triglycerides: 73 mg/dL (ref 0–149)
VLDL Cholesterol Cal: 14 mg/dL (ref 5–40)

## 2019-07-04 LAB — GLUCOSE, RANDOM: Glucose: 95 mg/dL (ref 65–99)

## 2019-10-10 ENCOUNTER — Other Ambulatory Visit: Payer: Self-pay | Admitting: Internal Medicine

## 2020-03-12 ENCOUNTER — Encounter: Payer: Self-pay | Admitting: *Deleted

## 2020-03-25 ENCOUNTER — Telehealth: Payer: Managed Care, Other (non HMO) | Admitting: Physician Assistant

## 2020-03-25 DIAGNOSIS — R059 Cough, unspecified: Secondary | ICD-10-CM

## 2020-03-25 DIAGNOSIS — Z20822 Contact with and (suspected) exposure to covid-19: Secondary | ICD-10-CM

## 2020-03-25 MED ORDER — FLUTICASONE PROPIONATE 50 MCG/ACT NA SUSP
2.0000 | Freq: Every day | NASAL | 0 refills | Status: DC
Start: 2020-03-25 — End: 2020-07-30

## 2020-03-25 MED ORDER — ALBUTEROL SULFATE HFA 108 (90 BASE) MCG/ACT IN AERS: 2.0000 | INHALATION_SPRAY | Freq: Four times a day (QID) | RESPIRATORY_TRACT | 0 refills | Status: DC | PRN

## 2020-03-25 MED ORDER — BENZONATATE 100 MG PO CAPS: 100.0000 mg | ORAL_CAPSULE | Freq: Three times a day (TID) | ORAL | 0 refills | Status: AC

## 2020-03-25 NOTE — Progress Notes (Signed)
E-Visit for Corona Virus Screening  Your current symptoms could be consistent with the coronavirus.  Many health care providers can now test patients at their office but not all are.  [facility] has multiple testing sites. For information on our COVID testing locations and hours go to [email or web address]  We are enrolling you in our MyChart Home Monitoring for COVID19 . Daily you will receive a questionnaire within the MyChart website. Our COVID 19 response team will be monitoring your responses daily.  Testing Information: The COVID-19 Community Testing sites are testing BY APPOINTMENT ONLY.  You can schedule online at [email or web address]  If you do not have access to a smart phone or computer you may call [phone] for an appointment.   Additional testing sites in the Community:  . For [facility] Testing sites in [place]  https://www.cvs.com/minuteclinic/covid-19-testing  . For Pop-up testing sites in [place]  https://covid19.ncdhhs.gov/about-covid-19/testing/find-my-testing-place/pop-testing-sites  . For [facility] https://[place].gov[place]/human-services/health-department/coronavirus-covid-19-info/covid-19-testing  . For [place] testing in [place] and [place] https://www.[place].gov/[place]/human-services/health-department/coronavirus-covid-19-info/covid-19-testing  . For [facility] testing in [place]   https://[facility].care/covidtesting  For  more information about community testing call [phone]   Please quarantine yourself while awaiting your test results. Please stay home for a minimum of 10 days from the first day of illness with improving symptoms and you have had 24 hours of no fever (without the use of Tylenol (Acetaminophen) Motrin (Ibuprofen) or any fever reducing medication).  Also - Do not get tested prior to returning to work because once you have had a positive test the test can stay  positive for more than a month in some cases.   You should wear a mask or cloth face covering over your nose and mouth if you must be around other people or animals, including pets (even at home). Try to stay at least 6 feet away from other people. This will protect the people around you.  Please continue good preventive care measures, including:  frequent hand-washing, avoid touching your face, cover coughs/sneezes, stay out of crowds and keep a 6 foot distance from others.  COVID-19 is a respiratory illness with symptoms that are similar to the flu. Symptoms are typically mild to moderate, but there have been cases of severe illness and death due to the virus.   The following symptoms may appear 2-14 days after exposure: . Fever . Cough . Shortness of breath or difficulty breathing . Chills . Repeated shaking with chills . Muscle pain . Headache . Sore throat . New loss of taste or smell . Fatigue . Congestion or runny nose . Nausea or vomiting . Diarrhea  Go to the nearest hospital ED for assessment if fever/cough/breathlessness are severe or illness seems like a threat to life.  It is vitally important that if you feel that you have an infection such as this virus or any other virus that you stay home and away from places where you may spread it to others.  You should avoid contact with people age 65 and older.   You can use medication such as A prescription cough medication called Tessalon Perles 100 mg. You may take 1-2 capsules every 8 hours as needed for cough, A prescription inhaler called Albuterol MDI 90 mcg /actuation 2 puffs every 4 hours as needed for shortness of breath, wheezing, cough and A prescription for Fluticasone nasal spray 2 sprays in each nostril one time per day.  You may also take acetaminophen (Tylenol) as needed for fever.  Reduce your risk of   any infection by using the same precautions used for avoiding the common cold or flu:  . Wash your hands often with  soap and warm water for at least 20 seconds.  If soap and water are not readily available, use an alcohol-based hand sanitizer with at least 60% alcohol.  . If coughing or sneezing, cover your mouth and nose by coughing or sneezing into the elbow areas of your shirt or coat, into a tissue or into your sleeve (not your hands). . Avoid shaking hands with others and consider head nods or verbal greetings only. . Avoid touching your eyes, nose, or mouth with unwashed hands.  . Avoid close contact with people who are sick. . Avoid places or events with large numbers of people in one location, like concerts or sporting events. . Carefully consider travel plans you have or are making. . If you are planning any travel outside or inside the [place], visit the CDC's Travelers' Health webpage for the latest health notices. . If you have some symptoms but not all symptoms, continue to monitor at home and seek medical attention if your symptoms worsen. . If you are having a medical emergency, call 911.  HOME CARE . Only take medications as instructed by your medical team. . Drink plenty of fluids and get plenty of rest. . A steam or ultrasonic humidifier can help if you have congestion.   GET HELP RIGHT AWAY IF YOU HAVE EMERGENCY WARNING SIGNS** FOR COVID-19. If you or someone is showing any of these signs seek emergency medical care immediately. Call 911 or proceed to your closest emergency facility if: . You develop worsening high fever. . Trouble breathing . Bluish lips or face . Persistent pain or pressure in the chest . New confusion . Inability to wake or stay awake . You cough up blood. . Your symptoms become more severe  **This list is not all possible symptoms. Contact your medical provider for any symptoms that are sever or concerning to you.  MAKE SURE YOU   Understand these instructions.  Will watch your condition.  Will get help right away if you are not doing well or get worse.  Your  e-visit answers were reviewed by a board certified advanced clinical practitioner to complete your personal care plan.  Depending on the condition, your plan could have included both over the counter or prescription medications.  If there is a problem please reply once you have received a response from your provider.  Your safety is important to us.  If you have drug allergies check your prescription carefully.    You can use MyChart to ask questions about today's visit, request a non-urgent call back, or ask for a work or school excuse for 24 hours related to this e-Visit. If it has been greater than 24 hours you will need to follow up with your provider, or enter a new e-Visit to address those concerns. You will get an e-mail in the next two days asking about your experience.  I hope that your e-visit has been valuable and will speed your recovery. Thank you for using e-visits.  Approximately 5 minutes was spent documenting and reviewing patient's chart.    

## 2020-04-13 ENCOUNTER — Other Ambulatory Visit: Payer: Self-pay | Admitting: Internal Medicine

## 2020-05-19 ENCOUNTER — Encounter: Payer: Self-pay | Admitting: Internal Medicine

## 2020-05-19 DIAGNOSIS — Z1231 Encounter for screening mammogram for malignant neoplasm of breast: Secondary | ICD-10-CM

## 2020-06-08 ENCOUNTER — Other Ambulatory Visit: Payer: Self-pay

## 2020-06-08 ENCOUNTER — Ambulatory Visit
Admission: RE | Admit: 2020-06-08 | Discharge: 2020-06-08 | Disposition: A | Discharge status: Home / Self Care | Payer: Managed Care, Other (non HMO) | Source: Ambulatory Visit | Attending: Internal Medicine | Admitting: Internal Medicine

## 2020-06-08 DIAGNOSIS — Z1231 Encounter for screening mammogram for malignant neoplasm of breast: Secondary | ICD-10-CM | POA: Diagnosis not present

## 2020-07-30 ENCOUNTER — Encounter: Payer: Self-pay | Admitting: Internal Medicine

## 2020-07-30 ENCOUNTER — Telehealth: Payer: Self-pay | Admitting: Internal Medicine

## 2020-07-30 ENCOUNTER — Other Ambulatory Visit: Payer: Self-pay

## 2020-07-30 ENCOUNTER — Ambulatory Visit (INDEPENDENT_AMBULATORY_CARE_PROVIDER_SITE_OTHER): Payer: Managed Care, Other (non HMO) | Admitting: Internal Medicine

## 2020-07-30 VITALS — BP 110/76 | HR 60 | Temp 97.7°F | Ht 68.0 in | Wt 200.0 lb

## 2020-07-30 DIAGNOSIS — R001 Bradycardia, unspecified: Secondary | ICD-10-CM | POA: Diagnosis not present

## 2020-07-30 DIAGNOSIS — K219 Gastro-esophageal reflux disease without esophagitis: Secondary | ICD-10-CM | POA: Diagnosis not present

## 2020-07-30 DIAGNOSIS — D509 Iron deficiency anemia, unspecified: Secondary | ICD-10-CM

## 2020-07-30 DIAGNOSIS — E039 Hypothyroidism, unspecified: Secondary | ICD-10-CM | POA: Diagnosis not present

## 2020-07-30 DIAGNOSIS — F439 Reaction to severe stress, unspecified: Secondary | ICD-10-CM

## 2020-07-30 DIAGNOSIS — E7800 Pure hypercholesterolemia, unspecified: Secondary | ICD-10-CM

## 2020-07-30 DIAGNOSIS — Z0000 Encounter for general adult medical examination without abnormal findings: Secondary | ICD-10-CM

## 2020-07-30 DIAGNOSIS — M48062 Spinal stenosis, lumbar region with neurogenic claudication: Secondary | ICD-10-CM

## 2020-07-30 DIAGNOSIS — Z23 Encounter for immunization: Secondary | ICD-10-CM | POA: Diagnosis not present

## 2020-07-30 DIAGNOSIS — Z0001 Encounter for general adult medical examination with abnormal findings: Secondary | ICD-10-CM | POA: Diagnosis not present

## 2020-07-30 DIAGNOSIS — Z1211 Encounter for screening for malignant neoplasm of colon: Secondary | ICD-10-CM | POA: Diagnosis not present

## 2020-07-30 NOTE — Progress Notes (Addendum)
Patient ID: Sydney Anderson, female   DOB: 1950-04-08, 70 y.o.   MRN: [id number]   Subjective:    Patient ID: Sydney Anderson, female    DOB: [birth year], 70 y.o.   MRN: [id number]  HPI This visit occurred during the SARS-CoV-2 public health emergency.  Safety protocols were in place, including screening questions prior to the visit, additional usage of staff PPE, and extensive cleaning of exam room while observing appropriate contact time as indicated for disinfecting solutions.   Patient here for her physical exam.  Increased stress.  Discussed.  Does not feel needs any further intervention.  Has noticed recently her heart rate has been low and fluctuating.  Also has felt irregular.  Has not been on metoprolol regularly.  Had episode Monday, where she felt some nausea.  States heart rate 40.  Blood pressure 111/70 - pulse 50s.  Felt better on Tuesday, but still noticed some change.  No chest pain.  Did take a metoprolol Tuesday.  Breathing overall stable.  States pulse ox 94-95% - reports baseline.  No vomiting. Eating. No abdominal pain.  Bowels moving.  Has a history of episodes of increased heart rate.  Different - concerned regarding slower rate.  No syncope or near syncope.  Her back - limits her activity.  Also pain - hips and knees.     Past Medical History:  Diagnosis Date   Arthritis    Asthma    Depression    Diverticulitis    GERD (gastroesophageal reflux disease)    Hypertension    Hypothyroidism    Kidney stones    Migraines    Past Surgical History:  Procedure Laterality Date   accident  2005   EYE SURGERY  2010,2011   FOOT SURGERY  2006   kidney stones  1980   SHOULDER ADHESION RELEASE  2008   Family History  Problem Relation Age of Onset   Arthritis Mother    Lung cancer Mother    Mental illness Mother    Colon cancer Father    Mental illness Father    Heart disease Maternal Grandfather    Stroke Maternal Grandfather    Breast cancer Paternal  Grandmother 48   Social History   Socioeconomic History   Marital status: Married    Spouse name: Not on file   Number of children: Not on file   Years of education: Not on file   Highest education level: Not on file  Occupational History   Not on file  Tobacco Use   Smoking status: Former    Pack years: 0.00   Smokeless tobacco: Former    Quit date: 12/05/2007  Substance and Sexual Activity   Alcohol use: No    Alcohol/week: 0.0 standard drinks   Drug use: No   Sexual activity: Not on file  Other Topics Concern   Not on file  Social History Narrative   Not on file   Social Determinants of Health   Financial Resource Strain: Not on file  Food Insecurity: Not on file  Transportation Needs: Not on file  Physical Activity: Not on file  Stress: Not on file  Social Connections: Not on file    Outpatient Encounter Medications as of 07/30/2020  Medication Sig   Calcium Citrate-Vitamin D (CALCIUM + D PO) Take by mouth.   FLUoxetine (PROZAC) 10 MG tablet TAKE 1 TABLET(10 MG) BY MOUTH DAILY   levothyroxine (SYNTHROID) 50 MCG tablet TAKE 1 TABLET(50 MCG) BY MOUTH DAILY  methylcellulose (ARTIFICIAL TEARS) 1 % ophthalmic solution Place 1 drop into both eyes as needed.   metoprolol tartrate (LOPRESSOR) 50 MG tablet Take 1 tablet (50 mg total) by mouth daily as needed.   prednisoLONE acetate (PRED FORTE) 1 % ophthalmic suspension APPLY 1 DROP TO THE RIGHT EYE 4 TIMES A DAY STARTING 2 DAYS BEFORE LASER TREATMENT   sodium chloride (OCEAN) 0.65 % nasal spray Place 1 spray into the nose as needed.   Timolol Maleate 0.5 % (DAILY) SOLN    VOLTAREN 1 % GEL    [DISCONTINUED] albuterol (VENTOLIN HFA) 108 (90 Base) MCG/ACT inhaler Inhale 2 puffs into the lungs every 6 (six) hours as needed for wheezing or shortness of breath. (Patient not taking: Reported on 07/30/2020)   [DISCONTINUED] aspirin 81 MG tablet Take 81 mg by mouth daily. (Patient not taking: Reported on 07/30/2020)    [DISCONTINUED] fluticasone (FLONASE) 50 MCG/ACT nasal spray Place 2 sprays into both nostrils daily. (Patient not taking: Reported on 07/30/2020)   [DISCONTINUED] MAGNESIUM PO Take by mouth. (Patient not taking: Reported on 07/30/2020)   [DISCONTINUED] ranitidine (ZANTAC) 150 MG capsule Take 150 mg by mouth daily as needed.  (Patient not taking: Reported on 07/30/2020)   No facility-administered encounter medications on file as of 07/30/2020.     Review of Systems  Constitutional:  Negative for appetite change and unexpected weight change.  HENT:  Negative for congestion, sinus pressure and sore throat.   Eyes:  Negative for pain and visual disturbance.  Respiratory:  Negative for cough and chest tightness.        Breathing stable.   Cardiovascular:  Negative for chest pain and leg swelling.       Reported some notice of possible irregularity.    Gastrointestinal:  Negative for abdominal pain, diarrhea and vomiting.       No nausea currently.   Genitourinary:  Negative for difficulty urinating and dysuria.  Musculoskeletal:  Negative for joint swelling and myalgias.  Skin:  Negative for color change and rash.  Neurological:  Negative for dizziness, light-headedness and headaches.  Hematological:  Negative for adenopathy. Does not bruise/bleed easily.  Psychiatric/Behavioral:  Negative for agitation and dysphoric mood.       Objective:    Physical Exam Vitals reviewed.  Constitutional:      General: She is not in acute distress.    Appearance: Normal appearance. She is well-developed.  HENT:     Head: Normocephalic and atraumatic.     Right Ear: External ear normal.     Left Ear: External ear normal.  Eyes:     General: No scleral icterus.       Right eye: No discharge.        Left eye: No discharge.     Conjunctiva/sclera: Conjunctivae normal.  Neck:     Thyroid: No thyromegaly.  Cardiovascular:     Rate and Rhythm: Normal rate and regular rhythm.  Pulmonary:     Effort: No  tachypnea, accessory muscle usage or respiratory distress.     Breath sounds: Normal breath sounds. No decreased breath sounds or wheezing.  Chest:  Breasts:    Right: No inverted nipple, mass, nipple discharge or tenderness (no axillary adenopathy).     Left: No inverted nipple, mass, nipple discharge or tenderness (no axilarry adenopathy).  Abdominal:     General: Bowel sounds are normal.     Palpations: Abdomen is soft.     Tenderness: There is no abdominal tenderness.  Musculoskeletal:  General: No swelling or tenderness.     Cervical back: Neck supple.  Lymphadenopathy:     Cervical: No cervical adenopathy.  Skin:    Findings: No erythema or rash.  Neurological:     Mental Status: She is alert and oriented to person, place, and time.  Psychiatric:        Mood and Affect: Mood normal.        Behavior: Behavior normal.    BP 110/76 (BP Location: Left Arm, Patient Position: Sitting, Cuff Size: Large)   Pulse 60   Temp 97.7 F (36.5 C) (Oral)   Ht 5\' 8"  (1.727 m)   Wt 200 lb (90.7 kg)   SpO2 98%   BMI 30.41 kg/m  Wt Readings from Last 3 Encounters:  07/30/20 200 lb (90.7 kg)  07/03/19 195 lb (88.5 kg)  01/11/19 195 lb (88.5 kg)    Outpatient Encounter Medications as of 07/30/2020  Medication Sig   Calcium Citrate-Vitamin D (CALCIUM + D PO) Take by mouth.   FLUoxetine (PROZAC) 10 MG tablet TAKE 1 TABLET(10 MG) BY MOUTH DAILY   levothyroxine (SYNTHROID) 50 MCG tablet TAKE 1 TABLET(50 MCG) BY MOUTH DAILY   methylcellulose (ARTIFICIAL TEARS) 1 % ophthalmic solution Place 1 drop into both eyes as needed.   metoprolol tartrate (LOPRESSOR) 50 MG tablet Take 1 tablet (50 mg total) by mouth daily as needed.   prednisoLONE acetate (PRED FORTE) 1 % ophthalmic suspension APPLY 1 DROP TO THE RIGHT EYE 4 TIMES A DAY STARTING 2 DAYS BEFORE LASER TREATMENT   sodium chloride (OCEAN) 0.65 % nasal spray Place 1 spray into the nose as needed.   Timolol Maleate 0.5 % (DAILY) SOLN     VOLTAREN 1 % GEL    [DISCONTINUED] albuterol (VENTOLIN HFA) 108 (90 Base) MCG/ACT inhaler Inhale 2 puffs into the lungs every 6 (six) hours as needed for wheezing or shortness of breath. (Patient not taking: Reported on 07/30/2020)   [DISCONTINUED] aspirin 81 MG tablet Take 81 mg by mouth daily. (Patient not taking: Reported on 07/30/2020)   [DISCONTINUED] fluticasone (FLONASE) 50 MCG/ACT nasal spray Place 2 sprays into both nostrils daily. (Patient not taking: Reported on 07/30/2020)   [DISCONTINUED] MAGNESIUM PO Take by mouth. (Patient not taking: Reported on 07/30/2020)   [DISCONTINUED] ranitidine (ZANTAC) 150 MG capsule Take 150 mg by mouth daily as needed.  (Patient not taking: Reported on 07/30/2020)   No facility-administered encounter medications on file as of 07/30/2020.     Lab Results  Component Value Date   WBC 6.8 08/22/2018   HGB 15.0 08/22/2018   HCT 43.4 08/22/2018   PLT 211 08/22/2018   GLUCOSE 95 07/03/2019   CHOL 176 07/03/2019   TRIG 73 07/03/2019   HDL 58 07/03/2019   LDLCALC 104 (H) 07/03/2019   ALT 63 (H) 10/18/2018   AST 48 (H) 10/18/2018   NA 139 08/22/2018   K 4.3 08/22/2018   CL 102 08/22/2018   CREATININE 0.59 08/22/2018   BUN 10 08/22/2018   CO2 20 08/22/2018   TSH 3.710 08/22/2018   INR 1.0 11/07/2012    MM 3D SCREEN BREAST BILATERAL  Result Date: 06/09/2020 CLINICAL DATA:  Screening. EXAM: DIGITAL SCREENING BILATERAL MAMMOGRAM WITH TOMOSYNTHESIS AND CAD TECHNIQUE: Bilateral screening digital craniocaudal and mediolateral oblique mammograms were obtained. Bilateral screening digital breast tomosynthesis was performed. The images were evaluated with computer-aided detection. COMPARISON:  Previous exam(s). ACR Breast Density Category a: The breast tissue is almost entirely fatty. FINDINGS: There are  no findings suspicious for malignancy. The images were evaluated with computer-aided detection. IMPRESSION: No mammographic evidence of malignancy. A result letter  of this screening mammogram will be mailed directly to the patient. RECOMMENDATION: Screening mammogram in one year. (Code:SM-B-01Y) BI-RADS CATEGORY  1: Negative. Electronically Signed   By: Sande Brothers M.D.   On: 06/09/2020 09:17       Assessment & Plan:   Problem List Items Addressed This Visit     Anemia    Colonoscopy 08/2013 - recommended f/u in 08/2018.  Overdue.  Will refer back to GI.  Will need to get cardiac issues sorted through first.         Bradycardia    Reported episode of bradycardia and irregularity.  Unclear etiology.  Did not feel "right".  Was questioning irregularity. No chest pain. Had some nausea and just felt different.  Has a history of intermittent episodes of increased heart rate - takes metoprolol prn.  Has not been taking regularly.  Multiple EKGs attempted -artifact.  Overall ventricular rate varied - manually - 70s range.  No acute ischemic changes.  Discussed further cardiac w/up.  Discussed possible need for monitor, echo, etc.  She is agreeable.  Check routine labs.  [place]         Relevant Orders   EKG 12-Lead   Ambulatory referral to Cardiology   GERD (gastroesophageal reflux disease)    EGD 08/2013 as outlined.  Some nausea recently.  Eating.  No vomiting.  Pursue cardiac w/up as outlined.         Health care maintenance    Physical today 07/30/2020.  Mammogram 06/09/2020 BI-RADS 1.  Colonoscopy July 2015.  Overdue.  Refer back to GI.       Hypercholesteremia    Low-cholesterol diet and exercise.  Follow lipid panel.       Hypothyroidism    On thyroid replacement.  Follow tsh.        Lumbar stenosis with neurogenic claudication    Has seen Ortho and Dr Yves Dill.  Saw spine specialist.  Does have persistent issues.  Limits her activity.  Desires natural which at this point.  Follow-up.       Stress    Increased stress.  Discussed with her today.  Does not feel needs any further intervention at this time.  Follow         Other Visit Diagnoses     Routine general medical examination at a health care facility    -  Primary   Need for prophylactic vaccination against diphtheria-tetanus-pertussis with typhoid-paratyphoid (DTP + TAB)       Need for pneumococcal vaccine       Relevant Orders   Pneumococcal polysaccharide vaccine 23-valent greater than or equal to 2yo subcutaneous/IM (Completed)   Colon cancer screening       Relevant Orders   Ambulatory referral to Gastroenterology        Dale [place], MD

## 2020-07-30 NOTE — Telephone Encounter (Signed)
Letter dictated

## 2020-08-04 ENCOUNTER — Encounter: Payer: Self-pay | Admitting: Internal Medicine

## 2020-08-04 DIAGNOSIS — E039 Hypothyroidism, unspecified: Secondary | ICD-10-CM

## 2020-08-04 DIAGNOSIS — D509 Iron deficiency anemia, unspecified: Secondary | ICD-10-CM

## 2020-08-04 DIAGNOSIS — E7800 Pure hypercholesterolemia, unspecified: Secondary | ICD-10-CM

## 2020-08-04 DIAGNOSIS — R7989 Other specified abnormal findings of blood chemistry: Secondary | ICD-10-CM

## 2020-08-04 NOTE — Telephone Encounter (Signed)
Pt called back returning your call °

## 2020-08-04 NOTE — Telephone Encounter (Signed)
Confirmed with patient that she is not having any active symptoms. She is feeling better today. She is going to go to ED if she starts having symptoms again. Advised that cardiology referral has been placed and she could still set up appt with them even if she has to be evaluated at ED. Patient gave understanding and agreed to be evaluated if any acute symptoms. I have typed up orders for her labs and printed for signature. Patient is aware.

## 2020-08-04 NOTE — Assessment & Plan Note (Addendum)
Reported episode of bradycardia and irregularity.  Unclear etiology.  Did not feel "right".  Was questioning irregularity. No chest pain. Had some nausea and just felt different.  Has a history of intermittent episodes of increased heart rate - takes metoprolol prn.  Has not been taking regularly.  Multiple EKGs attempted -artifact.  Overall ventricular rate varied - manually - 70s range.  No acute ischemic changes.  Discussed further cardiac w/up.  Discussed possible need for monitor, echo, etc.  She is agreeable.  Check routine labs.  [place]

## 2020-08-04 NOTE — Telephone Encounter (Signed)
Please call her and find out how she is doing.  I have ordered labs and have fax number, but I ordered as [employer]. May just need written out what labs needed.  I have placed order for cardiology referral, but if acute symptoms, she needs to go ahead and be evaluated and then we can f/u.

## 2020-08-04 NOTE — Assessment & Plan Note (Signed)
Colonoscopy 08/2013 - recommended f/u in 08/2018.  Overdue.  Will refer back to GI.  Will need to get cardiac issues sorted through first.

## 2020-08-05 NOTE — Assessment & Plan Note (Signed)
EGD 08/2013 as outlined.  Some nausea recently.  Eating.  No vomiting.  Pursue cardiac w/up as outlined.

## 2020-08-05 NOTE — Assessment & Plan Note (Signed)
Increased stress.  Discussed with her today.  Does not feel needs any further intervention at this time.  Follow.   

## 2020-08-05 NOTE — Assessment & Plan Note (Signed)
Low cholesterol diet and exercise.  Follow lipid panel.   

## 2020-08-05 NOTE — Assessment & Plan Note (Signed)
Physical today 07/30/2020.  Mammogram 06/09/2020 BI-RADS 1.  Colonoscopy July 2015.  Overdue.  Refer back to GI.

## 2020-08-05 NOTE — Assessment & Plan Note (Signed)
Has seen Ortho and Dr Yves Dill.  Saw spine specialist.  Does have persistent issues.  Limits her activity.  Desires natural which at this point.  Follow-up.

## 2020-08-05 NOTE — Assessment & Plan Note (Signed)
On thyroid replacement.  Follow tsh.  

## 2020-08-06 ENCOUNTER — Ambulatory Visit: Payer: Managed Care, Other (non HMO) | Admitting: Internal Medicine

## 2020-08-06 ENCOUNTER — Other Ambulatory Visit: Payer: Self-pay | Admitting: Internal Medicine

## 2020-08-06 ENCOUNTER — Other Ambulatory Visit: Payer: Self-pay

## 2020-08-06 ENCOUNTER — Ambulatory Visit (INDEPENDENT_AMBULATORY_CARE_PROVIDER_SITE_OTHER): Payer: Managed Care, Other (non HMO)

## 2020-08-06 ENCOUNTER — Encounter: Payer: Self-pay | Admitting: Internal Medicine

## 2020-08-06 VITALS — BP 130/80 | HR 58 | Ht 68.0 in | Wt 201.0 lb

## 2020-08-06 DIAGNOSIS — R001 Bradycardia, unspecified: Secondary | ICD-10-CM

## 2020-08-06 DIAGNOSIS — R079 Chest pain, unspecified: Secondary | ICD-10-CM

## 2020-08-06 DIAGNOSIS — R002 Palpitations: Secondary | ICD-10-CM

## 2020-08-06 DIAGNOSIS — R0602 Shortness of breath: Secondary | ICD-10-CM

## 2020-08-06 NOTE — Patient Instructions (Signed)
Medication Instructions:   Your physician recommends that you continue on your current medications as directed. Please refer to the Current Medication list given to you today.  *If you need a refill on your cardiac medications before your next appointment, please call your pharmacy*   Lab Work:  None ordered  Testing/Procedures:  1) Your physician has requested that you have an echocardiogram AFTER you have worn zio monitor for 2 weeks. Echocardiography is a painless test that uses sound waves to create images of your heart. It provides your doctor with information about the size and shape of your heart and how well your heart's chambers and valves are working. This procedure takes approximately one hour. There are no restrictions for this procedure.  2) Your physician has recommended that you wear a Zio XT monitor for TWO WEEKS.   This monitor is a medical device that records the heart's electrical activity. Doctors most often use these monitors to diagnose arrhythmias. Arrhythmias are problems with the speed or rhythm of the heartbeat. The monitor is a small device applied to your chest. You can wear one while you do your normal daily activities. While wearing this monitor if you have any symptoms to push the button and record what you felt. Once you have worn this monitor for the period of time provider prescribed (Usually 14 days), you will return the monitor device in the postage paid box. Once it is returned they will download the data collected and provide [place] with a report which the provider will then review and we will call you with those results. Important tips:  Avoid showering during the first 24 hours of wearing the monitor. Avoid excessive sweating to help maximize wear time. Do not submerge the device, no hot tubs, and no swimming pools. Keep any lotions or oils away from the patch. After 24 hours you may shower with the patch on. Take brief showers with your back facing the  shower head.  Do not remove patch once it has been placed because that will interrupt data and decrease adhesive wear time. Push the button when you have any symptoms and write down what you were feeling. Once you have completed wearing your monitor, remove and place into box which has postage paid and place in your outgoing mailbox.  If for some reason you have misplaced your box then call our office and we can provide another box and/or mail it off for you.     Follow-Up: At [facility], you and your health needs are our priority.  As part of our continuing mission to provide you with exceptional heart care, we have created designated Provider Care Teams.  These Care Teams include your primary Cardiologist (physician) and Advanced Practice Providers (APPs -  Physician Assistants and Nurse Practitioners) who all work together to provide you with the care you need, when you need it.  We recommend signing up for the patient portal called "MyChart".  Sign up information is provided on this After Visit Summary.  MyChart is used to connect with patients for Virtual Visits (Telemedicine).  Patients are able to view lab/test results, encounter notes, upcoming appointments, etc.  Non-urgent messages can be sent to your provider as well.   To learn more about what you can do with MyChart, go to [email or web address].    Your next appointment:   6 week(s)  The format for your next appointment:   In Person  Provider:   You may see Dr. Cristal Deer End or one of  the following Advanced Practice Providers on your designated Care Team:   Nicolasa Ducking, NP Eula Listen, PA-C Marisue Ivan, PA-C Cadence Elmore, [place] Gillian Shields, NP

## 2020-08-06 NOTE — Progress Notes (Signed)
New Outpatient Visit Date: 08/06/2020  Referring Provider: Dale Avon, MD [address] [place],  [place] [address]  Chief Complaint: Low heart rate  HPI:  Sydney Anderson is a 70 y.o. female who is being seen today for the evaluation of elevated and slow heart rates at the request of Dr. Lorin Picket. She has a history of hypertension, hypothyroidism, kidney stones, vision loss, and asthma.  Sydney Anderson reports intermittent palpitations over the last 7 years during which her heart rate will suddenly speed up.  She has been using as needed metoprolol with good effect.  However, over the last few weeks, she has intermittently experienced low heart rates.  1.5 weeks ago, she felt nauseated and dizzy, which prompted her to check her blood pressure.  She noted that her blood pressure was low with heart rate readings as low as 40 bpm.  Her pulse has stayed in the 40s or 50s for a few days.  She had one of her tachycardia episodes this past weekend when lying down.  Elevated heart rates would come and go every few minutes.  She did not take metoprolol given preceding bradycardia and concerns that her heart rate may become even slower.  Sydney Anderson reports that she was diagnosed with a paroxysmal tachycardia several years ago following an ED visit at [facility] (records are not available).  She underwent an echocardiogram at that time and was told that she had some mild regurgitation.  She notes burning in her chest when her heart rate is fast, though she does not have any exertional symptoms.  She has felt out of breath with her low heart rates and also feels lightheaded whenever her heart rate is faster or slower than normal.  She does not exercise regularly due to arthritis.  She has not had any edema.  --------------------------------------------------------------------------------------------------  Cardiovascular History & Procedures: Cardiovascular  Problems: Palpitations Bradycardia  Risk Factors: Hypertension, obesity, and age greater than 19  Cath/PCI: None  CV Surgery: None  EP Procedures and Devices: None  Non-Invasive Evaluation(s): None available  Recent CV Pertinent Labs: Lab Results  Component Value Date   CHOL 176 07/03/2019   HDL 58 07/03/2019   LDLCALC 104 (H) 07/03/2019   TRIG 73 07/03/2019   CHOLHDL 3.0 07/03/2019   CHOLHDL 4 10/16/2014   INR 1.0 11/07/2012   K 4.3 08/22/2018   K 3.4 (L) 11/21/2012   BUN 10 08/22/2018   BUN 7 11/21/2012   CREATININE 0.59 08/22/2018   CREATININE 0.59 (L) 11/21/2012    --------------------------------------------------------------------------------------------------  Past Medical History:  Diagnosis Date   Arthritis    Asthma    Depression    Diverticulitis    GERD (gastroesophageal reflux disease)    Glaucoma    Hypertension    Hypothyroidism    Kidney stones    Migraines    Myopia of both eyes     Past Surgical History:  Procedure Laterality Date   accident  2005   EYE SURGERY  2010,2011   FOOT SURGERY  2006   kidney stones  1980   SHOULDER ADHESION RELEASE  2008    Current Meds  Medication Sig   Calcium Citrate-Vitamin D (CALCIUM + D PO) Take by mouth. Takes 1 tablet most days   FLUoxetine (PROZAC) 10 MG tablet TAKE 1 TABLET(10 MG) BY MOUTH DAILY   levothyroxine (SYNTHROID) 50 MCG tablet TAKE 1 TABLET(50 MCG) BY MOUTH DAILY   methylcellulose (ARTIFICIAL TEARS) 1 % ophthalmic solution Place 1 drop into  both eyes as needed.   metoprolol tartrate (LOPRESSOR) 50 MG tablet Take 1 tablet (50 mg total) by mouth daily as needed.   prednisoLONE acetate (PRED FORTE) 1 % ophthalmic suspension APPLY 1 DROP TO THE RIGHT EYE 4 TIMES A DAY STARTING 2 DAYS BEFORE LASER TREATMENT   sodium chloride (OCEAN) 0.65 % nasal spray Place 1 spray into the nose as needed.   Timolol Maleate 0.5 % (DAILY) SOLN Place into the right eye daily.   VOLTAREN 1 % GEL Apply  topically as needed.    Allergies: Horse-derived products and Tetanus toxoids  Social History   Tobacco Use   Smoking status: Former    Pack years: 0.00    Types: Cigarettes    Quit date: 2009    Years since quitting: 13.5   Smokeless tobacco: Former    Quit date: 12/05/2007  Vaping Use   Vaping Use: Never used  Substance Use Topics   Alcohol use: Yes    Alcohol/week: 15.0 standard drinks    Types: 15 Standard drinks or equivalent per week    Comment: hard cider   Drug use: No    Family History  Problem Relation Age of Onset   Arthritis Mother    Lung cancer Mother    Mental illness Mother    Colon cancer Father    Mental illness Father    Heart attack Brother    Stroke Maternal Aunt    Heart disease Maternal Aunt    Heart disease Maternal Grandfather        Pacemaker   Stroke Maternal Grandfather    Breast cancer Paternal Grandmother 50    Review of Systems: A 12-system review of systems was performed and was negative except as noted in the HPI.  --------------------------------------------------------------------------------------------------  Physical Exam: BP 130/80 (BP Location: Right Arm, Patient Position: Sitting, Cuff Size: Large)   Pulse (!) 58   Ht 5\' 8"  (1.727 m)   Wt 201 lb (91.2 kg)   SpO2 96%   BMI 30.56 kg/m   General: NAD. HEENT: No conjunctival pallor or scleral icterus. Facemask in place. Neck: Supple without lymphadenopathy, thyromegaly, JVD, or HJR. No carotid bruit. Lungs: Normal work of breathing. Clear to auscultation bilaterally without wheezes or crackles. Heart: Bradycardic but regular with 1/6 systolic murmur.  No rubs or gallops. Abd: Bowel sounds present. Soft, NT/ND without hepatosplenomegaly Ext: No lower extremity edema. Radial, PT, and DP pulses are 2+ bilaterally Skin: Warm and dry without rash. Neuro: CNIII-XII intact. Strength and fine-touch sensation intact in upper and lower extremities bilaterally. Psych: Normal  mood and affect.  EKG: Sinus bradycardia with left axis deviation and nonspecific ST/T changes.  Lab Results  Component Value Date   WBC 6.8 08/22/2018   HGB 15.0 08/22/2018   HCT 43.4 08/22/2018   MCV 90 08/22/2018   PLT 211 08/22/2018    Lab Results  Component Value Date   NA 139 08/22/2018   K 4.3 08/22/2018   CL 102 08/22/2018   CO2 20 08/22/2018   BUN 10 08/22/2018   CREATININE 0.59 08/22/2018   GLUCOSE 95 07/03/2019   ALT 63 (H) 10/18/2018    Lab Results  Component Value Date   CHOL 176 07/03/2019   HDL 58 07/03/2019   LDLCALC 104 (H) 07/03/2019   TRIG 73 07/03/2019   CHOLHDL 3.0 07/03/2019   Lab Results  Component Value Date   TSH 3.710 08/22/2018   T4TOTAL 5.8 12/14/2016    --------------------------------------------------------------------------------------------------  ASSESSMENT AND  PLAN: Palpitations, bradycardia, and shortness of breath: Sydney Anderson has a long history of paroxysmal tachycardia dating back at least 7 years.  This has typically responded well to as needed metoprolol.  However, over the last few weeks, she has noticed frequent bradycardia with associated lightheadedness and dizziness.  Heart rate is borderline low today at 58 bpm.  EKG shows low voltage and nonspecific ST/T changes, which are subtle and not significantly changed from prior tracing in 2020.  Physical exam today is largely unremarkable though I do appreciate a very soft systolic murmur.  I have recommended that we obtain a 14-day event monitor as well as a transthoracic echocardiogram for further assessment.  Sydney Anderson is scheduled for labs next week through Dr. Roby Lofts office, including CBC, BMP, liver function panel, TSH, and lipid panel.  We will not draw any labs today and defer ischemia testing pending event monitoring and echocardiogram.  Follow-up: Return to clinic in 6 weeks.  Yvonne Kendall, MD 08/07/2020 7:06 AM

## 2020-08-07 ENCOUNTER — Encounter: Payer: Self-pay | Admitting: Internal Medicine

## 2020-08-07 DIAGNOSIS — R0602 Shortness of breath: Secondary | ICD-10-CM | POA: Insufficient documentation

## 2020-08-07 DIAGNOSIS — R079 Chest pain, unspecified: Secondary | ICD-10-CM | POA: Insufficient documentation

## 2020-08-11 ENCOUNTER — Other Ambulatory Visit: Payer: Managed Care, Other (non HMO)

## 2020-08-11 ENCOUNTER — Other Ambulatory Visit: Payer: Self-pay | Admitting: Family Medicine

## 2020-08-11 VITALS — BP 110/82 | HR 62 | Temp 97.9°F | Resp 16 | Ht 68.0 in | Wt 200.0 lb

## 2020-08-11 DIAGNOSIS — R7989 Other specified abnormal findings of blood chemistry: Secondary | ICD-10-CM

## 2020-08-11 DIAGNOSIS — Z008 Encounter for other general examination: Secondary | ICD-10-CM | POA: Diagnosis not present

## 2020-08-11 DIAGNOSIS — R945 Abnormal results of liver function studies: Secondary | ICD-10-CM | POA: Diagnosis not present

## 2020-08-11 DIAGNOSIS — D509 Iron deficiency anemia, unspecified: Secondary | ICD-10-CM

## 2020-08-11 DIAGNOSIS — E7800 Pure hypercholesterolemia, unspecified: Secondary | ICD-10-CM

## 2020-08-11 DIAGNOSIS — E039 Hypothyroidism, unspecified: Secondary | ICD-10-CM | POA: Diagnosis not present

## 2020-08-11 NOTE — Addendum Note (Signed)
Addended by: Lucrezia Europe on: 08/11/2020 04:50 PM   Modules accepted: Orders

## 2020-08-12 LAB — HEPATIC FUNCTION PANEL
ALT: 48 IU/L — ABNORMAL HIGH (ref 0–32)
AST: 33 IU/L (ref 0–40)
Albumin: 4.6 g/dL (ref 3.8–4.8)
Alkaline Phosphatase: 113 IU/L (ref 44–121)
Bilirubin Total: 0.6 mg/dL (ref 0.0–1.2)
Bilirubin, Direct: 0.18 mg/dL (ref 0.00–0.40)
Total Protein: 6.5 g/dL (ref 6.0–8.5)

## 2020-08-12 LAB — CBC WITH DIFFERENTIAL/PLATELET
Basophils Absolute: 0.1 10*3/uL (ref 0.0–0.2)
Basos: 1 %
EOS (ABSOLUTE): 0.2 10*3/uL (ref 0.0–0.4)
Eos: 3 %
Hematocrit: 42.4 % (ref 34.0–46.6)
Hemoglobin: 14.6 g/dL (ref 11.1–15.9)
Immature Grans (Abs): 0 10*3/uL (ref 0.0–0.1)
Immature Granulocytes: 1 %
Lymphocytes Absolute: 2.1 10*3/uL (ref 0.7–3.1)
Lymphs: 28 %
MCH: 31 pg (ref 26.6–33.0)
MCHC: 34.4 g/dL (ref 31.5–35.7)
MCV: 90 fL (ref 79–97)
Monocytes Absolute: 0.4 10*3/uL (ref 0.1–0.9)
Monocytes: 5 %
Neutrophils Absolute: 4.7 10*3/uL (ref 1.4–7.0)
Neutrophils: 62 %
Platelets: 264 10*3/uL (ref 150–450)
RBC: 4.71 x10E6/uL (ref 3.77–5.28)
RDW: 12.7 % (ref 11.7–15.4)
WBC: 7.4 10*3/uL (ref 3.4–10.8)

## 2020-08-12 LAB — LIPID PANEL W/O CHOL/HDL RATIO
Cholesterol, Total: 173 mg/dL (ref 100–199)
HDL: 55 mg/dL (ref >39)
LDL Chol Calc (NIH): 97 mg/dL (ref 0–99)
Triglycerides: 117 mg/dL (ref 0–149)
VLDL Cholesterol Cal: 21 mg/dL (ref 5–40)

## 2020-08-12 LAB — BASIC METABOLIC PANEL
BUN/Creatinine Ratio: 20 (ref 12–28)
BUN: 13 mg/dL (ref 8–27)
CO2: 21 mmol/L (ref 20–29)
Calcium: 9.1 mg/dL (ref 8.7–10.3)
Chloride: 104 mmol/L (ref 96–106)
Creatinine, Ser: 0.65 mg/dL (ref 0.57–1.00)
Glucose: 97 mg/dL (ref 65–99)
Potassium: 4.6 mmol/L (ref 3.5–5.2)
Sodium: 141 mmol/L (ref 134–144)
eGFR: 96 mL/min/{1.73_m2} (ref >59)

## 2020-08-12 LAB — TSH: TSH: 6.15 u[IU]/mL — ABNORMAL HIGH (ref 0.450–4.500)

## 2020-08-17 ENCOUNTER — Telehealth: Payer: Self-pay | Admitting: Internal Medicine

## 2020-08-17 NOTE — Telephone Encounter (Signed)
-----   Message from Charlynne Pander, RN sent at 08/17/2020  8:56 AM EDT ----- Lab results attached.

## 2020-08-17 NOTE — Telephone Encounter (Signed)
Received lab results (from outside lab) . Please notify Ms Beavers that her cholesterol levels are ok.  TSH is slightly elevated.  Please confirm she is on synthroid q day and taking regularly.  If so, then increase synthroid to q day.  Will need f/u TSH in 6 weeks.  One liver test is slightly elevated. The remainder of the liver function tests are wnl.  Liver function tests improved from last check.  We will need to follow.  Recheck liver panel with f/u tsh.  Hgb and kidney function tests are wnl.

## 2020-08-18 ENCOUNTER — Other Ambulatory Visit: Payer: Self-pay | Admitting: Internal Medicine

## 2020-08-18 MED ORDER — LEVOTHYROXINE SODIUM 75 MCG PO TABS: ORAL_TABLET | ORAL | 1 refills | Status: DC

## 2020-08-18 NOTE — Addendum Note (Signed)
Addended by: Hulan Fray on: 08/18/2020 03:59 PM   Modules accepted: Orders

## 2020-08-18 NOTE — Telephone Encounter (Signed)
Called and spoke with Sydney Anderson. Sydney Anderson verbalized understanding and had no further questions. She asks that we order the labs like last time [place] and she will go to have them drawn after 6 weeks. She was agreeable to the increase of her synthroid. A new prescription of synthroid has been refilled to the pharmacy.

## 2020-08-18 NOTE — Telephone Encounter (Signed)
Pt returned your call.  

## 2020-08-18 NOTE — Telephone Encounter (Signed)
Left a message to call back for lab results. 

## 2020-08-18 NOTE — Telephone Encounter (Signed)
I have typed a letter with labs needed.  She had asked for labs to be faxed to [phone], but if she does not need until 6 weeks, does she want [place] to send her the orders and then she can take with her to have labs drawn ( in 6 weeks).

## 2020-08-19 NOTE — Telephone Encounter (Signed)
Received a call from [place]. Informed her that we have the letter with the patients Labs. Javayah declined coming to pick up the letter and states that she will send [place] a Mychart message a few days before her appointment and then we can fax the letter to them. Fax number provided was [phone] per Dr. Marina Goodell message.

## 2020-08-19 NOTE — Telephone Encounter (Signed)
Left a message to call back.

## 2020-08-26 ENCOUNTER — Ambulatory Visit (INDEPENDENT_AMBULATORY_CARE_PROVIDER_SITE_OTHER): Payer: Managed Care, Other (non HMO)

## 2020-08-26 ENCOUNTER — Other Ambulatory Visit: Payer: Self-pay

## 2020-08-26 DIAGNOSIS — R001 Bradycardia, unspecified: Secondary | ICD-10-CM | POA: Diagnosis not present

## 2020-08-26 DIAGNOSIS — R002 Palpitations: Secondary | ICD-10-CM

## 2020-08-26 DIAGNOSIS — R0602 Shortness of breath: Secondary | ICD-10-CM | POA: Diagnosis not present

## 2020-08-26 LAB — ECHOCARDIOGRAM COMPLETE
AR max vel: 3.02 cm2
AV Area VTI: 3.03 cm2
AV Area mean vel: 2.97 cm2
AV Mean grad: 4 mmHg
AV Peak grad: 7.2 mmHg
Ao pk vel: 1.34 m/s
Area-P 1/2: 4.77 cm2
MV VTI: 4.24 cm2
S' Lateral: 2.6 cm

## 2020-08-31 ENCOUNTER — Telehealth: Payer: Self-pay | Admitting: *Deleted

## 2020-08-31 NOTE — Telephone Encounter (Signed)
Attempted to call pt to discuss echo and event monitor results and provider's recc.  No answer at this time. Lmtcb.

## 2020-08-31 NOTE — Telephone Encounter (Signed)
-----   Message from Yvonne Kendall, MD sent at 08/27/2020  1:50 PM EDT ----- Please let Ms. Batte know that her heart is contracting well.  There are no significant valvular abnormalities.  Event monitor shows occasional extra beats and multiple runs of supraventricular tachycardia lasting up to 18 seconds at a time.  If Ms. Philipp continues to have bothersome palpitations and flutuating heart rates, I suggest that we refer her to electrophysiology to discuss antiarrhythmic options for her PSVT.

## 2020-09-01 NOTE — Telephone Encounter (Signed)
Spoke to pt. Notified of echo and event monitor results and Dr. Serita Kyle recc.  Pt voiced understanding. She will contact our office if she continues to have continues to have bothersome palpitations and flutuating heart rates and we will refer to EP. Otherwise, pt will follow up as scheduled 09/18/20.  Pt has no further questions at this time.

## 2020-09-01 NOTE — Telephone Encounter (Signed)
Attempted to call pt. No answer. Lmtcb.  

## 2020-09-17 NOTE — Progress Notes (Signed)
Cardiology Office Note:    Date:  09/18/2020   ID:  Sydney Anderson, DOB 05/11/1950, MRN [id number]  PCP:  Dale Rossburg, MD  [facility] Cardiologist:  Yvonne Kendall, MD  [facility] Electrophysiologist:  None   Referring MD: Dale [place], MD   Chief Complaint: 1 month follow-up  History of Present Illness:    Sydney Anderson is a 70 y.o. female with a hx of HTN, hypothyroidism, HTN, kidney stones, vision loss, asthma, paroxysmal tachycardia who is being seen for 1 month follow-up.   Seen 08/06/20 for elevated heart rates and palpitations. Using metoprolol as needed. Echo, labs, event monitor ordered. Heart monitor showed NSR, PACs, PVCs, and 483 atrial runs lasting up to 17.9 seconds. Echo 08/26/20 showed LVEF 65-70%, no WMA, normal DD  Today, the echo and heart monitor were reviewed in detail. Patient reports she is stil having palpitations. She occasionally gets chest pain with palpitations. She monitors her heart rate and BP at home. She can feel if her heart rate is slow or fast. She reports heart rates down into the 40s at times. She is taking Lopressor 50mg  only as needed, she last took metoprolol 1.5 weeks ago. No LLE, orthopnea, pnd. Feels some sob when she bends over, but she is also deconditioned and has a hiatal hernia. Dr. follows thyroid, increases levothyroxine at the last visit.   Past Medical History:  Diagnosis Date   Arthritis    Asthma    Depression    Diverticulitis    GERD (gastroesophageal reflux disease)    Glaucoma    Hypertension    Hypothyroidism    Kidney stones    Migraines    Myopia of both eyes     Past Surgical History:  Procedure Laterality Date   accident  2005   EYE SURGERY  2010,2011   FOOT SURGERY  2006   kidney stones  1980   SHOULDER ADHESION RELEASE  2008    Current Medications: Current Meds  Medication Sig   Calcium Citrate-Vitamin D (CALCIUM + D PO) Take by mouth. Takes 1 tablet most days    FLUoxetine (PROZAC) 10 MG tablet TAKE 1 TABLET(10 MG) BY MOUTH DAILY   levothyroxine (SYNTHROID) 75 MCG tablet TAKE 1 TABLET(75 MCG) BY MOUTH DAILY   methylcellulose (ARTIFICIAL TEARS) 1 % ophthalmic solution Place 1 drop into both eyes as needed.   metoprolol tartrate (LOPRESSOR) 50 MG tablet Take 1 tablet (50 mg total) by mouth daily as needed.   prednisoLONE acetate (PRED FORTE) 1 % ophthalmic suspension APPLY 1 DROP TO THE RIGHT EYE 4 TIMES A DAY STARTING 2 DAYS BEFORE LASER TREATMENT   sodium chloride (OCEAN) 0.65 % nasal spray Place 1 spray into the nose as needed.   Timolol Maleate 0.5 % (DAILY) SOLN Place into the right eye daily.   VOLTAREN 1 % GEL Apply topically as needed.     Allergies:   Horse-derived products and Tetanus toxoids   Social History   Socioeconomic History   Marital status: Married    Spouse name: Not on file   Number of children: Not on file   Years of education: Not on file   Highest education level: Not on file  Occupational History   Not on file  Tobacco Use   Smoking status: Former    Types: Cigarettes    Quit date: 2009    Years since quitting: 13.6   Smokeless tobacco: Former    Quit date: 12/05/2007  Vaping Use  Vaping Use: Never used  Substance and Sexual Activity   Alcohol use: Yes    Alcohol/week: 15.0 standard drinks    Types: 15 Standard drinks or equivalent per week    Comment: hard cider   Drug use: No   Sexual activity: Not on file  Other Topics Concern   Not on file  Social History Narrative   Not on file   Social Determinants of Health   Financial Resource Strain: Not on file  Food Insecurity: Not on file  Transportation Needs: Not on file  Physical Activity: Not on file  Stress: Not on file  Social Connections: Not on file     Family History: The patient's family history includes Arthritis in her mother; Breast cancer (age of onset: 29) in her paternal grandmother; Colon cancer in her father; Heart attack in her  brother; Heart disease in her maternal aunt and maternal grandfather; Lung cancer in her mother; Mental illness in her father and mother; Stroke in her maternal aunt and maternal grandfather.  ROS:   Please see the history of present illness.     All other systems reviewed and are negative.  EKGs/Labs/Other Studies Reviewed:    The following studies were reviewed today:  Heart monitor 07/2020   The patient was monitored for 14 days. The predominant rhythm was sinus with an average rate of 66 bpm (range 43-120 bpm and sinus). There were occasional PACs and rare PVCs. 483 atrial runs occurred, lasting up to 17.9 seconds with a maximum rate of 182 bpm. There was no sustained arrhythmia or prolonged pause. Patient triggered events corresponded to normal sinus rhythm, PACs, and PVCs.   Predominantly sinus rhythm with occasional PACs and rare PVCs.  Numerous brief atrial runs lasting up to 18 seconds occurred.  Echo 08/26/20   The patient was monitored for 14 days. The predominant rhythm was sinus with an average rate of 66 bpm (range 43-120 bpm and sinus). There were occasional PACs and rare PVCs. 483 atrial runs occurred, lasting up to 17.9 seconds with a maximum rate of 182 bpm. There was no sustained arrhythmia or prolonged pause. Patient triggered events corresponded to normal sinus rhythm, PACs, and PVCs.   Predominantly sinus rhythm with occasional PACs and rare PVCs.  Numerous brief atrial runs lasting up to 18 seconds occurred.  EKG:  EKG is  ordered today.  The ekg ordered today demonstrates NSR, HR 63bpm, nonspecific T wave abnormality  Recent Labs: 08/11/2020: ALT 48; BUN 13; Creatinine, Ser 0.65; Hemoglobin 14.6; Platelets 264; Potassium 4.6; Sodium 141; TSH 6.150  Recent Lipid Panel    Component Value Date/Time   CHOL 173 08/11/2020 0900   TRIG 117 08/11/2020 0900   HDL 55 08/11/2020 0900   CHOLHDL 3.0 07/03/2019 1029   CHOLHDL 4 10/16/2014 0851   VLDL 19.2 10/16/2014  0851   LDLCALC 97 08/11/2020 0900     Physical Exam:    VS:  BP 120/82 (BP Location: Left Arm, Patient Position: Sitting, Cuff Size: Normal)   Pulse 63   Ht 5\' 8"  (1.727 m)   Wt 201 lb 6 oz (91.3 kg)   SpO2 98%   BMI 30.62 kg/m     Wt Readings from Last 3 Encounters:  09/18/20 201 lb 6 oz (91.3 kg)  08/11/20 200 lb (90.7 kg)  08/06/20 201 lb (91.2 kg)     GEN:  Well nourished, well developed in no acute distress HEENT: Normal NECK: No JVD; No carotid bruits LYMPHATICS: No lymphadenopathy  CARDIAC: RRR, no murmurs, rubs, gallops RESPIRATORY:  Clear to auscultation without rales, wheezing or rhonchi  ABDOMEN: Soft, non-tender, non-distended MUSCULOSKELETAL:  No edema; No deformity  SKIN: Warm and dry NEUROLOGIC:  Alert and oriented x 3 PSYCHIATRIC:  Normal affect   ASSESSMENT:    1. SVT (supraventricular tachycardia) (HCC)   2. Palpitations   3. Bradycardia    PLAN:    In order of problems listed above:  pSVT/palpitations/bradycardia Heart monitor showed multiple atrial runs. Echo showed normal LVEF and no significant valvular disease. Patient takes Lopressor 50mg  as needed. Caution with daily BB due to bradycardia. She reports heart rates in the 40-50s at times. Patient continues to have palpitations with associated occasional chest pain. EKG shows SR 63bpm with no ectopic beats and nonspecific T wave changes. PCP is adjusting thyroid medications. I will refer her to EP for further work-up.   Disposition: Follow up in 3 month(s) with MD/APP   Signed, Maleeyah Mccaughey , PA-C  09/18/2020 4:05 PM    [facility]

## 2020-09-18 ENCOUNTER — Other Ambulatory Visit: Payer: Self-pay

## 2020-09-18 ENCOUNTER — Ambulatory Visit: Payer: Managed Care, Other (non HMO) | Admitting: Medical

## 2020-09-18 ENCOUNTER — Encounter: Payer: Self-pay | Admitting: Medical

## 2020-09-18 VITALS — BP 120/82 | HR 63 | Ht 68.0 in | Wt 201.4 lb

## 2020-09-18 DIAGNOSIS — R002 Palpitations: Secondary | ICD-10-CM | POA: Diagnosis not present

## 2020-09-18 DIAGNOSIS — I471 Supraventricular tachycardia, unspecified: Secondary | ICD-10-CM

## 2020-09-18 DIAGNOSIS — R001 Bradycardia, unspecified: Secondary | ICD-10-CM | POA: Diagnosis not present

## 2020-09-18 NOTE — Patient Instructions (Signed)
Medication Instructions:  Please continue your current medications  *If you need a refill on your cardiac medications before your next appointment, please call your pharmacy*   Lab Work: None  Testing/Procedures: None  Follow-Up: At [facility], you and your health needs are our priority.  As part of our continuing mission to provide you with exceptional heart care, we have created designated Provider Care Teams.  These Care Teams include your primary Cardiologist (physician) and Advanced Practice Providers (APPs -  Physician Assistants and Nurse Practitioners) who all work together to provide you with the care you need, when you need it.  Your next appointment:   3 month(s)  The format for your next appointment:   In Person  Provider:   Cadence Fransico Michael, PA-C   Other Instructions We have referred you to the EP provider Dr. Lalla Brothers  A clinical cardiac electrophysiologist (cardiac EP) is a healthcare provider who treats heart rhythm problems.

## 2020-10-16 ENCOUNTER — Encounter: Payer: Self-pay | Admitting: Internal Medicine

## 2020-10-23 NOTE — Telephone Encounter (Signed)
The patient is requesting that her labs be sent to [phone].

## 2020-10-28 ENCOUNTER — Encounter: Payer: Self-pay | Admitting: Cardiology

## 2020-10-28 ENCOUNTER — Other Ambulatory Visit: Payer: Self-pay

## 2020-10-28 ENCOUNTER — Ambulatory Visit: Payer: Managed Care, Other (non HMO) | Admitting: Cardiology

## 2020-10-28 VITALS — BP 120/88 | HR 54 | Ht 68.0 in | Wt 201.0 lb

## 2020-10-28 DIAGNOSIS — R072 Precordial pain: Secondary | ICD-10-CM | POA: Diagnosis not present

## 2020-10-28 DIAGNOSIS — I471 Supraventricular tachycardia, unspecified: Secondary | ICD-10-CM

## 2020-10-28 DIAGNOSIS — R002 Palpitations: Secondary | ICD-10-CM | POA: Diagnosis not present

## 2020-10-28 MED ORDER — PROPAFENONE HCL 225 MG PO TABS
225.0000 mg | ORAL_TABLET | Freq: Two times a day (BID) | ORAL | 3 refills | Status: DC
Start: 2020-10-28 — End: 2021-11-18

## 2020-10-28 NOTE — Progress Notes (Signed)
Electrophysiology Office Note:    Date:  10/28/2020   ID:  Sydney Anderson, Sydney Anderson Nov 05, 1950, MRN [id number]  PCP:  Dale [place], MD  [facility] Cardiologist:  Yvonne Kendall, MD  [facility] Electrophysiologist:  None   Referring MD: Marianne Sofia, PA-C   Chief Complaint: Palpitations  History of Present Illness:    Sydney Anderson is a 70 y.o. female who presents for an evaluation of palpitations at the request of Cadence [place], [place]. Their medical history includes hypothyroidism, hypertension, GERD.  Patient tells me that she feels intermittent palpitations that date back to 2005.  These episodes would have heart rates into the 180s.  Recently these episodes of changed and now at times she has some chest discomfort that accompanies the palpitations.  She also has headaches.  Episodes tend to last hours at a time.  Unfortunately she did not have one of the prolonged episodes when she was wearing the heart monitor.  There are other periods of palpitations when she feels a slower heart rate.  When she saw Cadence in clinic, a ZIO was ordered which did not show evidence of any AV block.  There was a significant burden of atrial tachycardia and PACs.  Past Medical History:  Diagnosis Date   Arthritis    Asthma    Depression    Diverticulitis    GERD (gastroesophageal reflux disease)    Glaucoma    Hypertension    Hypothyroidism    Kidney stones    Migraines    Myopia of both eyes     Past Surgical History:  Procedure Laterality Date   accident  2005   EYE SURGERY  2010,2011   FOOT SURGERY  2006   kidney stones  1980   SHOULDER ADHESION RELEASE  2008    Current Medications: Current Meds  Medication Sig   FLUoxetine (PROZAC) 10 MG tablet TAKE 1 TABLET(10 MG) BY MOUTH DAILY   levothyroxine (SYNTHROID) 75 MCG tablet TAKE 1 TABLET(75 MCG) BY MOUTH DAILY   methylcellulose (ARTIFICIAL TEARS) 1 % ophthalmic solution Place 1 drop into both eyes as needed.    metoprolol tartrate (LOPRESSOR) 50 MG tablet Take 1 tablet (50 mg total) by mouth daily as needed.   prednisoLONE acetate (PRED FORTE) 1 % ophthalmic suspension APPLY 1 DROP TO THE RIGHT EYE 4 TIMES A DAY STARTING 2 DAYS BEFORE LASER TREATMENT   sodium chloride (OCEAN) 0.65 % nasal spray Place 1 spray into the nose as needed.   Timolol Maleate 0.5 % (DAILY) SOLN Place into the right eye daily.   VOLTAREN 1 % GEL Apply topically as needed.     Allergies:   Horse-derived products and Tetanus toxoids   Social History   Socioeconomic History   Marital status: Married    Spouse name: Not on file   Number of children: Not on file   Years of education: Not on file   Highest education level: Not on file  Occupational History   Not on file  Tobacco Use   Smoking status: Former    Types: Cigarettes    Quit date: 2009    Years since quitting: 13.7   Smokeless tobacco: Former    Quit date: 12/05/2007  Vaping Use   Vaping Use: Never used  Substance and Sexual Activity   Alcohol use: Yes    Alcohol/week: 15.0 standard drinks    Types: 15 Standard drinks or equivalent per week    Comment: hard cider   Drug use:  No   Sexual activity: Not on file  Other Topics Concern   Not on file  Social History Narrative   Not on file   Social Determinants of Health   Financial Resource Strain: Not on file  Food Insecurity: Not on file  Transportation Needs: Not on file  Physical Activity: Not on file  Stress: Not on file  Social Connections: Not on file     Family History: The patient's family history includes Arthritis in her mother; Breast cancer (age of onset: 25) in her paternal grandmother; Colon cancer in her father; Heart attack in her brother; Heart disease in her maternal aunt and maternal grandfather; Lung cancer in her mother; Mental illness in her father and mother; Stroke in her maternal aunt and maternal grandfather.  ROS:   Please see the history of present illness.    All  other systems reviewed and are negative.  EKGs/Labs/Other Studies Reviewed:    The following studies were reviewed today:  August 25, 2020 ZIO personally reviewed The patient was monitored for 14 days. The predominant rhythm was sinus with an average rate of 66 bpm (range 43-120 bpm and sinus). There were occasional PACs and rare PVCs. 483 atrial runs occurred, lasting up to 17.9 seconds with a maximum rate of 182 bpm. There was no sustained arrhythmia or prolonged pause. Patient triggered events corresponded to normal sinus rhythm, PACs, and PVCs.     August 26, 2020 echo personally reviewed Left ventricular function normal, 65% Right ventricular function normal No significant valvular abnormalities  EKG:  The ekg ordered today demonstrates sinus rhythm.  Recent Labs: 08/11/2020: ALT 48; BUN 13; Creatinine, Ser 0.65; Hemoglobin 14.6; Platelets 264; Potassium 4.6; Sodium 141; TSH 6.150  Recent Lipid Panel    Component Value Date/Time   CHOL 173 08/11/2020 0900   TRIG 117 08/11/2020 0900   HDL 55 08/11/2020 0900   CHOLHDL 3.0 07/03/2019 1029   CHOLHDL 4 10/16/2014 0851   VLDL 19.2 10/16/2014 0851   LDLCALC 97 08/11/2020 0900    Physical Exam:    VS:  BP 120/88 (BP Location: Left Arm, Patient Position: Sitting, Cuff Size: Normal)   Pulse (!) 54   Ht 5\' 8"  (1.727 m)   Wt 201 lb (91.2 kg)   SpO2 96%   BMI 30.56 kg/m     Wt Readings from Last 3 Encounters:  10/28/20 201 lb (91.2 kg)  09/18/20 201 lb 6 oz (91.3 kg)  08/11/20 200 lb (90.7 kg)     GEN:  Well nourished, well developed in no acute distress HEENT: Normal NECK: No JVD; No carotid bruits LYMPHATICS: No lymphadenopathy CARDIAC: RRR, no murmurs, rubs, gallops RESPIRATORY:  Clear to auscultation without rales, wheezing or rhonchi  ABDOMEN: Soft, non-tender, non-distended MUSCULOSKELETAL:  No edema; No deformity  SKIN: Warm and dry NEUROLOGIC:  Alert and oriented x 3 PSYCHIATRIC:  Normal affect    ASSESSMENT:    1. Precordial pain   2. Palpitations   3. SVT (supraventricular tachycardia) (HCC)    PLAN:    In order of problems listed above:  1. Precordial pain Atypical.  We will plan for a exercise nuclear stress test to evaluate the chest pain and to evaluate the electrical stability on propafenone.  2. Palpitations 3. SVT (supraventricular tachycardia) (HCC) Appears to be atrial tachycardia based on the recent ZIO monitor.  I recommended that we start a class Ic agent.  I would like to avoid having her use a standing beta-blocker such as  metoprolol so I will initiate propafenone today.  We will have her follow-up with a stress test in 7 to 10 days.   Follow-up in 4 to 6 weeks with a PA/NP.     Medication Adjustments/Labs and Tests Ordered: Current medicines are reviewed at length with the patient today.  Concerns regarding medicines are outlined above.  Orders Placed This Encounter  Procedures   NM Myocar Multi W/Spect W/Wall Motion / EF   EKG 12-Lead   No orders of the defined types were placed in this encounter.    Signed, Rossie Muskrat. Lalla Brothers, MD, [facility], [facility] 10/28/2020 3:51 PM    Electrophysiology  [facility]

## 2020-10-28 NOTE — Patient Instructions (Addendum)
Medication Instructions:  Your physician has recommended you make the following change in your medication:    START taking propafenone 225 mg-  Take one tablet by mouth twice a day  *If you need a refill on your cardiac medications before your next appointment, please call your pharmacy*  Lab Work: None ordered. If you have labs (blood work) drawn today and your tests are completely normal, you will receive your results only by: MyChart Message (if you have MyChart) OR A paper copy in the mail If you have any lab test that is abnormal or we need to change your treatment, we will call you to review the results.  Testing/Procedures: You will be scheduled for an exercise nuclear stress test at [facility] in 7-10 days.  Follow-Up: At [facility], you and your health needs are our priority.  As part of our continuing mission to provide you with exceptional heart care, we have created designated Provider Care Teams.  These Care Teams include your primary Cardiologist (physician) and Advanced Practice Providers (APPs -  Physician Assistants and Nurse Practitioners) who all work together to provide you with the care you need, when you need it.  Your next appointment:   Your physician wants you to follow-up in: 4-6 weeks with one of the following Advanced Practice Providers on your designated Care Team:   Nicolasa Ducking, NP Eula Listen, PA-C Marisue Ivan, PA-C Cadence Fransico [place], [place]   [facility] MYOVIEW  Your caregiver has ordered a Stress Test with nuclear imaging. The purpose of this test is to evaluate the blood supply to your heart muscle. This procedure is referred to as a "Non-Invasive Stress Test." This is because other than having an IV started in your vein, nothing is inserted or "invades" your body. Cardiac stress tests are done to find areas of poor blood flow to the heart by determining the extent of coronary artery disease (CAD). Some patients exercise on a treadmill, which naturally  increases the blood flow to your heart, while others who are  unable to walk on a treadmill due to physical limitations have a pharmacologic/chemical stress agent called Lexiscan . This medicine will mimic walking on a treadmill by temporarily increasing your coronary blood flow.   Please note: these test may take anywhere between 2-4 hours to complete  PLEASE REPORT TO [facility] MEDICAL MALL ENTRANCE  THE VOLUNTEERS AT THE FIRST DESK WILL DIRECT YOU WHERE TO GO  Date of Procedure:_____________________________________  Arrival Time for Procedure:______________________________  Instructions regarding medication:   TAKE ALL normal morning medications  PLEASE NOTIFY THE OFFICE AT LEAST 24 HOURS IN ADVANCE IF YOU ARE UNABLE TO KEEP YOUR APPOINTMENT.  [phone] AND  PLEASE NOTIFY NUCLEAR MEDICINE AT [facility] AT LEAST 24 HOURS IN ADVANCE IF YOU ARE UNABLE TO KEEP YOUR APPOINTMENT. [phone]  How to prepare for your Myoview test:  Do not eat or drink after midnight No caffeine for 24 hours prior to test No smoking 24 hours prior to test. Your medication may be taken with water.  If your doctor stopped a medication because of this test, do not take that medication. Ladies, please do not wear dresses.  Skirts or pants are appropriate. Please wear a short sleeve shirt. No perfume, cologne or lotion. Wear comfortable walking shoes. No heels!   Propafenone Tablets What is this medication? PROPAFENONE (proe pa FEEN one) prevents and treats a fast or irregular heartbeat (arrhythmia). It is often used to treat a type of arrhythmia known as AFib (atrial fibrillation). It  works by slowing down overactive electric signals in the heart, which stabilizes your heart rhythm. It belongs to a group of medications called antiarrhythmics. This medicine may be used for other purposes; ask your health care provider or pharmacist if you have questions. COMMON BRAND NAME(S): Rythmol What should I tell my care team  before I take this medication? They need to know if you have any of these conditions: Heart disease High potassium level Kidney disease Liver disease Low blood pressure Lung or breathing disease like asthma, chronic bronchitis, or emphysema Pacemaker Slow heart rate An unusual or allergic reaction to propafenone, other medications, foods, dyes, or preservatives Pregnant or trying to get pregnant Breast-feeding How should I use this medication? Take this medication by mouth with a glass of water. Follow the directions on the prescription label. You can take this medication with or without food. Take your doses at regular intervals. Do not take your medication more often than directed. Do not stop taking except on the advice of your care team. Talk to your care team regarding the use of this medication in children. Special care may be needed. Overdosage: If you think you have taken too much of this medicine contact a poison control center or emergency room at once. NOTE: This medicine is only for you. Do not share this medicine with others. What if I miss a dose? If you miss a dose, take it as soon as you can. If it is almost time for your next dose, take only that dose. Do not take double or extra doses. What may interact with this medication? Do not take this medication with any of the following: Arsenic trioxide Certain antibiotics like clarithromycin, erythromycin, grepafloxacin, pentamidine, sparfloxacin, troleandomycin Certain medications for depression or mental illness like amoxapine, haloperidol, maprotiline, pimozide, sertindole, thioridazine, tricyclic antidepressants Certain medications for fungal infections like fluconazole, itraconazole, ketoconazole, posaconazole, voriconazole Certain medications for irregular heartbeat like dronedarone Certain medications for malaria like chloroquine, halofantrine Cisapride Droperidol Levomethadyl Ranolazine This medication may also  interact with the following: Certain medications for angina or blood pressure Certain medications for asthma or breathing difficulties like formoterol, salmeterol Certain medications that treat or prevent blood clots like warfarin Cimetidine Cyclosporine Digoxin Diuretics Local anesthetics Other medications that prolong the QT interval (cause an abnormal heart rhythm) like dofetilide, ziprasidone Rifampin Ritonavir Theophylline This list may not describe all possible interactions. Give your health care provider a list of all the medicines, herbs, non-prescription drugs, or dietary supplements you use. Also tell them if you smoke, drink alcohol, or use illegal drugs. Some items may interact with your medicine. What should I watch for while using this medication? Your condition will be monitored closely when you first begin therapy. Often, this medication is first started in a hospital or other monitored health care setting. Once you are on maintenance therapy, visit your care team for regular checks on your progress. Because your condition and use of this medication carry some risk, it is a good idea to carry an identification card, necklace or bracelet with details of your condition, medications, and care team. You may get drowsy or dizzy. Do not drive, use machinery, or do anything that needs mental alertness until you know how this medication affects you. Do not stand or sit up quickly, especially if you are an older patient. This reduces the risk of dizzy or fainting spells. If you are going to have surgery, tell your care team that you are taking this medication. What side effects may  I notice from receiving this medication? Side effects that you should report to your care team as soon as possible: Allergic reactions-skin rash, itching, hives, swelling of the face, lips, tongue, or throat Heart failure-shortness of breath, swelling of the ankles, feet, or hands, sudden weight gain, unusual  weakness or fatigue Heart rhythm changes-fast or irregular heartbeat, dizziness, feeling faint or lightheaded, chest pain, trouble breathing Infection-fever, chills, cough, sore throat Unusual bruising or bleeding Side effects that usually do not require medical attention (report to your care team if they continue or are bothersome): Change in taste Constipation Dizziness Fatigue Nausea This list may not describe all possible side effects. Call your doctor for medical advice about side effects. You may report side effects to FDA at 1-800-FDA-1088. Where should I keep my medication? Keep out of the reach of children and pets. Store at room temperature between 15 and 30 degrees C (59 and 86 degrees F). Protect from light. Keep container tightly closed. Throw away any unused medication after the expiration date. NOTE: This sheet is a summary. It may not cover all possible information. If you have questions about this medicine, talk to your doctor, pharmacist, or health care provider.  2022 Elsevier/Gold Standard (2020-06-03 14:31:47)

## 2020-11-04 ENCOUNTER — Other Ambulatory Visit: Payer: Managed Care, Other (non HMO)

## 2020-11-09 ENCOUNTER — Other Ambulatory Visit: Payer: Self-pay

## 2020-11-09 ENCOUNTER — Ambulatory Visit
Admission: RE | Admit: 2020-11-09 | Discharge: 2020-11-09 | Disposition: A | Discharge status: Home / Self Care | Payer: Managed Care, Other (non HMO) | Source: Ambulatory Visit | Attending: Cardiology | Admitting: Cardiology

## 2020-11-09 DIAGNOSIS — R072 Precordial pain: Secondary | ICD-10-CM | POA: Diagnosis not present

## 2020-11-09 LAB — NM MYOCAR MULTI W/SPECT W/WALL MOTION / EF
Angina Index: 0
Duke Treadmill Score: 4
Estimated workload: 4.6
Exercise duration (min): 4 min
Exercise duration (sec): 6 s
LV dias vol: 45 mL (ref 46–106)
LV sys vol: 14 mL
Nuc Stress EF: 69 %
Peak HR: 126 {beats}/min
Percent HR: 82 %
Rest HR: 66 {beats}/min
Rest Nuclear Isotope Dose: 10.2 mCi
SDS: 0
SRS: 0
SSS: 0
ST Depression (mm): 0 mm
Stress Nuclear Isotope Dose: 30.3 mCi
TID: 0.61

## 2020-11-09 MED ORDER — TECHNETIUM TC 99M TETROFOSMIN IV KIT
30.0000 | PACK | Freq: Once | INTRAVENOUS | Status: AC
  Administered 2020-11-09: 30.33 via INTRAVENOUS

## 2020-11-09 MED ORDER — TECHNETIUM TC 99M TETROFOSMIN IV KIT
10.0000 | PACK | Freq: Once | INTRAVENOUS | Status: AC | PRN
  Administered 2020-11-09: 10.2 via INTRAVENOUS

## 2020-12-04 ENCOUNTER — Ambulatory Visit (INDEPENDENT_AMBULATORY_CARE_PROVIDER_SITE_OTHER): Payer: Managed Care, Other (non HMO) | Admitting: Medical

## 2020-12-04 ENCOUNTER — Encounter: Payer: Self-pay | Admitting: Medical

## 2020-12-04 ENCOUNTER — Other Ambulatory Visit: Payer: Self-pay

## 2020-12-04 VITALS — BP 130/80 | HR 63 | Ht 68.0 in | Wt 202.0 lb

## 2020-12-04 DIAGNOSIS — I471 Supraventricular tachycardia: Secondary | ICD-10-CM

## 2020-12-04 DIAGNOSIS — R0602 Shortness of breath: Secondary | ICD-10-CM | POA: Diagnosis not present

## 2020-12-04 DIAGNOSIS — R002 Palpitations: Secondary | ICD-10-CM | POA: Diagnosis not present

## 2020-12-04 DIAGNOSIS — R001 Bradycardia, unspecified: Secondary | ICD-10-CM | POA: Diagnosis not present

## 2020-12-04 DIAGNOSIS — R072 Precordial pain: Secondary | ICD-10-CM

## 2020-12-04 NOTE — Patient Instructions (Addendum)
Medication Instructions:  Your physician recommends that you continue on your current medications as directed. Please refer to the Current Medication list given to you today.  *If you need a refill on your cardiac medications before your next appointment, please call your pharmacy*   Lab Work: None If you have labs (blood work) drawn today and your tests are completely normal, you will receive your results only by: MyChart Message (if you have MyChart) OR A paper copy in the mail If you have any lab test that is abnormal or we need to change your treatment, we will call you to review the results.    Follow-Up: At [facility], you and your health needs are our priority.  As part of our continuing mission to provide you with exceptional heart care, we have created designated Provider Care Teams.  These Care Teams include your primary Cardiologist (physician) and Advanced Practice Providers (APPs -  Physician Assistants and Nurse Practitioners) who all work together to provide you with the care you need, when you need it.   Your next appointment:   6 month(s)  The format for your next appointment:   In Person  Provider:   You may see Yvonne Kendall, MD or one of the following Advanced Practice Providers on your designated Care Team:   Nicolasa Ducking, NP Eula Listen, PA-C Cadence Fransico [place], [place]  Your next appointment: 1 year   The format for your next appointment: In person  Provider: Lanier Prude, MD

## 2020-12-04 NOTE — Progress Notes (Signed)
Cardiology Office Note:    Date:  12/04/2020   ID:  Sydney Anderson, DOB [birth year], MRN [id number]  PCP:  Dale [place], MD  [facility] Cardiologist:  Yvonne Kendall, MD  [facility] Electrophysiologist:  Lanier Prude, MD   Referring MD: Dale [place], MD   Chief Complaint: 4-6 week follow-up  History of Present Illness:    Sydney Anderson is a 70 y.o. female with a hx of HTN, hypothyroidism, HTN, kidney stones, vision loss, asthma, paroxysmal tachycardia who is being seen for 1 month follow-up.    Seen 08/06/20 for elevated heart rates and palpitations. Using metoprolol as needed. Echo, labs, event monitor ordered. Heart monitor showed NSR, PACs, PVCs, and 483 atrial runs lasting up to 17.9 seconds. Echo 08/26/20 showed LVEF 65-70%, no WMA, normal DD.   Last seen 09/2020 and was taking Lopressor as needed. She was referred to EP  She saw EP 10/28/20 and was started on Propafenone. Follow-up stress was ordered in 7-10 days.   Stress test was normal, low risk with no evidence of ischemia or infarction  Today, the patient reports she is feeling much better, no further palpitations. Stress test discussed briefly. Again, she should not take the BB. No chest pain, SOB, LLE, orthopnea, pnd. EKG today shows SR 63bpm, PRI , LAD, nonspecific T wave changes.   Past Medical History:  Diagnosis Date   Arthritis    Asthma    Depression    Diverticulitis    GERD (gastroesophageal reflux disease)    Glaucoma    Hypertension    Hypothyroidism    Kidney stones    Migraines    Myopia of both eyes     Past Surgical History:  Procedure Laterality Date   accident  2005   EYE SURGERY  2010,2011   FOOT SURGERY  2006   kidney stones  1980   SHOULDER ADHESION RELEASE  2008    Current Medications: Current Meds  Medication Sig   FLUoxetine (PROZAC) 10 MG tablet TAKE 1 TABLET(10 MG) BY MOUTH DAILY   levothyroxine (SYNTHROID) 75 MCG tablet TAKE 1 TABLET(75  MCG) BY MOUTH DAILY   methylcellulose (ARTIFICIAL TEARS) 1 % ophthalmic solution Place 1 drop into both eyes as needed.   prednisoLONE acetate (PRED FORTE) 1 % ophthalmic suspension APPLY 1 DROP TO THE RIGHT EYE 4 TIMES A DAY STARTING 2 DAYS BEFORE LASER TREATMENT   propafenone (RYTHMOL) 225 MG tablet Take 1 tablet (225 mg total) by mouth in the morning and at bedtime.   sodium chloride (OCEAN) 0.65 % nasal spray Place 1 spray into the nose as needed.   Timolol Maleate 0.5 % (DAILY) SOLN Place into the right eye daily.   VOLTAREN 1 % GEL Apply topically as needed.     Allergies:   Horse-derived products and Tetanus toxoids   Social History   Socioeconomic History   Marital status: Married    Spouse name: Not on file   Number of children: Not on file   Years of education: Not on file   Highest education level: Not on file  Occupational History   Not on file  Tobacco Use   Smoking status: Former    Types: Cigarettes    Quit date: 2009    Years since quitting: 13.8   Smokeless tobacco: Former    Quit date: 12/05/2007  Vaping Use   Vaping Use: Never used  Substance and Sexual Activity   Alcohol use: Yes    Alcohol/week: 15.0  standard drinks    Types: 15 Standard drinks or equivalent per week    Comment: hard cider   Drug use: No   Sexual activity: Not on file  Other Topics Concern   Not on file  Social History Narrative   Not on file   Social Determinants of Health   Financial Resource Strain: Not on file  Food Insecurity: Not on file  Transportation Needs: Not on file  Physical Activity: Not on file  Stress: Not on file  Social Connections: Not on file     Family History: The patient's family history includes Arthritis in her mother; Breast cancer (age of onset: 36) in her paternal grandmother; Colon cancer in her father; Heart attack in her brother; Heart disease in her maternal aunt and maternal grandfather; Lung cancer in her mother; Mental illness in her father  and mother; Stroke in her maternal aunt and maternal grandfather.  ROS:   Please see the history of present illness.     All other systems reviewed and are negative.  EKGs/Labs/Other Studies Reviewed:    The following studies were reviewed today:  Heart monitor 08/2020 The patient was monitored for 14 days. The predominant rhythm was sinus with an average rate of 66 bpm (range 43-120 bpm and sinus). There were occasional PACs and rare PVCs. 483 atrial runs occurred, lasting up to 17.9 seconds with a maximum rate of 182 bpm. There was no sustained arrhythmia or prolonged pause. Patient triggered events corresponded to normal sinus rhythm, PACs, and PVCs.   Predominantly sinus rhythm with occasional PACs and rare PVCs.  Numerous brief atrial runs lasting up to 18 seconds occurred.   Echo 08/2020  1. Left ventricular ejection fraction, by estimation, is 65 to 70%. The  left ventricle has normal function. The left ventricle has no regional  wall motion abnormalities. Left ventricular diastolic parameters were  normal.   2. Right ventricular systolic function is normal. The right ventricular  size is normal.   3. The mitral valve is normal in structure. No evidence of mitral valve  regurgitation.   4. The aortic valve was not well visualized. Aortic valve regurgitation  is not visualized.   5. The inferior vena cava is normal in size with greater than 50%  respiratory variability, suggesting right atrial pressure of 3 mmHg.   Myoview stress test 11/2020   The study is normal. The study is low risk.   No ST deviation was noted.   LV perfusion is normal. There is no evidence of ischemia. There is no evidence of infarction.   Left ventricular function is normal. End diastolic cavity size is normal. End systolic cavity size is normal.   EKG was not interpretable at peak stress due to significant motion artifact but no significant changes noted in recovery.   CT attenuation images showed  no significant coronary calcifications.    EKG:  EKG is  ordered today.  The ekg ordered today demonstrates NSR, 63bpm, LAD, PRO , IVCD  Recent Labs: 08/11/2020: ALT 48; BUN 13; Creatinine, Ser 0.65; Hemoglobin 14.6; Platelets 264; Potassium 4.6; Sodium 141; TSH 6.150  Recent Lipid Panel    Component Value Date/Time   CHOL 173 08/11/2020 0900   TRIG 117 08/11/2020 0900   HDL 55 08/11/2020 0900   CHOLHDL 3.0 07/03/2019 1029   CHOLHDL 4 10/16/2014 0851   VLDL 19.2 10/16/2014 0851   LDLCALC 97 08/11/2020 0900    Physical Exam:    VS:  BP 130/80 (BP  Location: Left Arm, Patient Position: Sitting, Cuff Size: Normal)   Pulse 63   Ht 5\' 8"  (1.727 m)   Wt 202 lb (91.6 kg)   SpO2 98%   BMI 30.71 kg/m     Wt Readings from Last 3 Encounters:  12/04/20 202 lb (91.6 kg)  10/28/20 201 lb (91.2 kg)  09/18/20 201 lb 6 oz (91.3 kg)     GEN:  Well nourished, well developed in no acute distress HEENT: Normal NECK: No JVD; No carotid bruits LYMPHATICS: No lymphadenopathy CARDIAC: RRR, no murmurs, rubs, gallops RESPIRATORY:  Clear to auscultation without rales, wheezing or rhonchi  ABDOMEN: Soft, non-tender, non-distended MUSCULOSKELETAL:  No edema; No deformity  SKIN: Warm and dry NEUROLOGIC:  Alert and oriented x 3 PSYCHIATRIC:  Normal affect   ASSESSMENT:    1. Precordial pain   2. Palpitations   3. SVT (supraventricular tachycardia) (HCC)   4. Bradycardia   5. SOB (shortness of breath)    PLAN:    In order of problems listed above:  Precordial pain Myoview Lexiscan showed no ischemia, low risk, normal LVSF, overall normal. She denies chest pain or shortness of breath. No further work-up at this time.  Palpitations/SVT Heart monitor showed significant burden of atrial tachycardia with PACs. Prior echo showed normal LV function. She saw Dr. 11/18/20 who started patient on Propafenone 225mg  BID. She is feeling much better, no further palpitations. Again, confirmed she  should NOT be taking BB. No other changes today. Will arrange follow-up with General cardiology and EP.   Disposition: Follow up in 6 month(s) with MD/APP    Signed, Keoshia Steinmetz Lalla Brothers, PA-C  12/04/2020 3:20 PM    [facility]

## 2020-12-25 ENCOUNTER — Ambulatory Visit: Payer: Managed Care, Other (non HMO) | Admitting: Medical

## 2021-02-10 ENCOUNTER — Other Ambulatory Visit: Payer: Self-pay | Admitting: Internal Medicine

## 2021-05-20 ENCOUNTER — Encounter: Payer: Self-pay | Admitting: Internal Medicine

## 2021-05-20 NOTE — Telephone Encounter (Signed)
Left message for patient to call office. Notified front to transfer to me. ? ?

## 2021-05-20 NOTE — Telephone Encounter (Signed)
Spoke with patient she say she is very depressed but not suicidal she is not going to harm herself, she is very thankful for the appt .  Tomorrow at [employer] ?

## 2021-05-20 NOTE — Telephone Encounter (Signed)
Please call pt and confirm doing ok.  I can work her in tomorrow for virtual if she is agreeable to be seen.  ?

## 2021-05-21 ENCOUNTER — Telehealth: Payer: Managed Care, Other (non HMO) | Admitting: Internal Medicine

## 2021-05-21 ENCOUNTER — Encounter: Payer: Self-pay | Admitting: Internal Medicine

## 2021-05-21 DIAGNOSIS — R002 Palpitations: Secondary | ICD-10-CM | POA: Diagnosis not present

## 2021-05-21 DIAGNOSIS — M48062 Spinal stenosis, lumbar region with neurogenic claudication: Secondary | ICD-10-CM

## 2021-05-21 DIAGNOSIS — K219 Gastro-esophageal reflux disease without esophagitis: Secondary | ICD-10-CM

## 2021-05-21 DIAGNOSIS — E039 Hypothyroidism, unspecified: Secondary | ICD-10-CM

## 2021-05-21 DIAGNOSIS — F321 Major depressive disorder, single episode, moderate: Secondary | ICD-10-CM

## 2021-05-21 DIAGNOSIS — D509 Iron deficiency anemia, unspecified: Secondary | ICD-10-CM | POA: Diagnosis not present

## 2021-05-21 DIAGNOSIS — E7800 Pure hypercholesterolemia, unspecified: Secondary | ICD-10-CM

## 2021-05-21 MED ORDER — FLUOXETINE HCL 20 MG PO TABS: 20.0000 mg | ORAL_TABLET | Freq: Every day | ORAL | 2 refills | Status: DC

## 2021-05-21 NOTE — Progress Notes (Signed)
Patient ID: Sydney Anderson, female   DOB: 18-Jun-1950, 71 y.o.   MRN: [id number] ? ? ?Virtual Visit via video Note ? ?This visit type was conducted due to national recommendations for restrictions regarding the COVID-19 pandemic (e.g. social distancing).  This format is felt to be most appropriate for this patient at this time.  All issues noted in this document were discussed and addressed.  No physical exam was performed (except for noted visual exam findings with Video Visits).  ? ?I connected with Sydney Anderson today by a video enabled telemedicine application and verified that I am speaking with the correct person using two identifiers. ?Location patient: home ?Location provider: work ?Persons participating in the virtual visit: patient, provider ? ?The limitations, risks, security and privacy concerns of performing an evaluation and management service by video and the availability of in person appointments have been discussed.  It has also been discussed with the patient that there may be a patient responsible charge related to this service. The patient expressed understanding and agreed to proceed. ? ? ?Reason for visit: work in appt ? ?HPI: ?Work in with concerns regarding increased stress and depression.  Working 2 and 3 jobs.  This past week has been unable to go to work.  Increased stress.  Having eye issues.  Decrease - vision.  Concern may lose license.  Recently underwent cardiac w/up.  Had to be out of work for this.  Myoview Lexiscan showed no ischemia, low risk, normal LVSF, overall normal. Heart monitor showed significant burden of atrial tachycardia with PACs. Prior echo showed normal LV function. She saw Dr. Quentin Ore who started patient on Propafenone 225mg  BID.  Is doing better regarding heart issues.  Having problems with increased depression.  Increased stress with world issues and her medical issues.  She is sleeping - stating sleeping too much.  Increased pain - msk pain.  Limits  activity.  On prozac.  Feels needs to increase the dose.  No suicidal ideation.   ? ? ?ROS: See pertinent positives and negatives per HPI. ? ?Past Medical History:  ?Diagnosis Date  ? Arthritis   ? Asthma   ? Depression   ? Diverticulitis   ? GERD (gastroesophageal reflux disease)   ? Glaucoma   ? Hypertension   ? Hypothyroidism   ? Kidney stones   ? Migraines   ? Myopia of both eyes   ? ? ?Past Surgical History:  ?Procedure Laterality Date  ? accident  2005  ? EYE SURGERY  2010,2011  ? FOOT SURGERY  2006  ? kidney stones  1980  ? SHOULDER ADHESION RELEASE  2008  ? ? ?Family History  ?Problem Relation Age of Onset  ? Arthritis Mother   ? Lung cancer Mother   ? Mental illness Mother   ? Colon cancer Father   ? Mental illness Father   ? Heart attack Brother   ? Stroke Maternal Aunt   ? Heart disease Maternal Aunt   ? Heart disease Maternal Grandfather   ?     Pacemaker  ? Stroke Maternal Grandfather   ? Breast cancer Paternal Grandmother 6  ? ? ?SOCIAL HX: reviewed.  ? ? ?Current Outpatient Medications:  ?  FLUoxetine (PROZAC) 20 MG tablet, Take 1 tablet (20 mg total) by mouth daily., Disp: 30 tablet, Rfl: 2 ?  levothyroxine (SYNTHROID) 75 MCG tablet, TAKE 1 TABLET(75 MCG) BY MOUTH DAILY, Disp: 90 tablet, Rfl: 1 ?  methylcellulose (ARTIFICIAL TEARS) 1 % ophthalmic solution, Place  1 drop into both eyes as needed., Disp: , Rfl:  ?  prednisoLONE acetate (PRED FORTE) 1 % ophthalmic suspension, APPLY 1 DROP TO THE RIGHT EYE 4 TIMES A DAY STARTING 2 DAYS BEFORE LASER TREATMENT, Disp: , Rfl: 0 ?  propafenone (RYTHMOL) 225 MG tablet, Take 1 tablet (225 mg total) by mouth in the morning and at bedtime., Disp: 180 tablet, Rfl: 3 ?  sodium chloride (OCEAN) 0.65 % nasal spray, Place 1 spray into the nose as needed., Disp: , Rfl:  ?  Timolol Maleate 0.5 % (DAILY) SOLN, Place into the right eye daily., Disp: , Rfl:  ?  VOLTAREN 1 % GEL, Apply topically as needed., Disp: , Rfl: 1 ? ?EXAM: ? ?GENERAL: alert, oriented, appears well  and in no acute distress ? ?HEENT: atraumatic, conjunttiva clear, no obvious abnormalities on inspection of external nose and ears ? ?NECK: normal movements of the head and neck ? ?LUNGS: on inspection no signs of respiratory distress, breathing rate appears normal, no obvious gross SOB, gasping or wheezing ? ?CV: no obvious cyanosis ? ?PSYCH/NEURO: pleasant and cooperative, no obvious depression or anxiety, speech and thought processing grossly intact ? ?ASSESSMENT AND PLAN: ? ?Discussed the following assessment and plan: ? ?Problem List Items Addressed This Visit   ? ? Anemia  ?  Colonoscopy 08/2013 - recommended f/u in 08/2018.  Overdue.  Treat current acute issues.  Needs f/u.   ?  ?  ? Depression, major, single episode, moderate ([place])  ?  Discussed increased stress and depression as outlined.  Discussed treatment.  Discussed counseling.  On prozac.  Will increase to 20mg  q day.  No suicidal ideations.  Note - out of work last week. To return next week.  Follow closely.  Call with update.  ? ?  ?  ? Relevant Medications  ? FLUoxetine (PROZAC) 20 MG tablet  ? GERD (gastroesophageal reflux disease)  ?  EGD 08/2013 as outlined. No upper symptoms reported.  ? ?  ?  ? Hypercholesteremia  ?  Low-cholesterol diet and exercise.  Follow lipid panel. ? ?  ?  ? Hypothyroidism  ?  On thyroid replacement.  Follow tsh.  ? ?  ?  ? Lumbar stenosis with neurogenic claudication  ?  Has seen Ortho and Dr Sharlet Salina.  Saw spine specialist.  Does have persistent issues.  Limits her activity. Increased pain as outlined.  Out of work last week.  Notify me if desires further intervention.  Follow.  ? ?  ?  ? Relevant Medications  ? FLUoxetine (PROZAC) 20 MG tablet  ? Palpitations  ?  Doing better.  Saw cardiology.  W/up as outlined.  On propafenone.  Follow.  ? ?  ?  ? ? ?Return in about 6 weeks (around 07/02/2021) for follow up appt (53min). ?  ?I discussed the assessment and treatment plan with the patient. The patient was provided an  opportunity to ask questions and all were answered. The patient agreed with the plan and demonstrated an understanding of the instructions. ?  ?The patient was advised to call back or seek an in-person evaluation if the symptoms worsen or if the condition fails to improve as anticipated. ? ? ? ?Einar Pheasant, MD   ?

## 2021-05-22 ENCOUNTER — Encounter: Payer: Self-pay | Admitting: Internal Medicine

## 2021-05-22 ENCOUNTER — Telehealth: Payer: Self-pay | Admitting: Internal Medicine

## 2021-05-22 DIAGNOSIS — F321 Major depressive disorder, single episode, moderate: Secondary | ICD-10-CM | POA: Insufficient documentation

## 2021-05-22 NOTE — Assessment & Plan Note (Signed)
Doing better.  Saw cardiology.  W/up as outlined.  On propafenone.  Follow.  ?

## 2021-05-22 NOTE — Assessment & Plan Note (Signed)
Colonoscopy 08/2013 - recommended f/u in 08/2018.  Overdue.  Treat current acute issues.  Needs f/u.   ?

## 2021-05-22 NOTE — Assessment & Plan Note (Signed)
Has seen Ortho and Dr Yves Dill.  Saw spine specialist.  Does have persistent issues.  Limits her activity. Increased pain as outlined.  Out of work last week.  Notify me if desires further intervention.  Follow.  ?

## 2021-05-22 NOTE — Assessment & Plan Note (Signed)
On thyroid replacement.  Follow tsh.  

## 2021-05-22 NOTE — Telephone Encounter (Signed)
Opened in error

## 2021-05-22 NOTE — Assessment & Plan Note (Signed)
Low cholesterol diet and exercise.  Follow lipid panel.   

## 2021-05-22 NOTE — Assessment & Plan Note (Signed)
Discussed increased stress and depression as outlined.  Discussed treatment.  Discussed counseling.  On prozac.  Will increase to 20mg  q day.  No suicidal ideations.  Note - out of work last week. To return next week.  Follow closely.  Call with update.  ?

## 2021-05-22 NOTE — Assessment & Plan Note (Signed)
EGD 08/2013 as outlined. No upper symptoms reported.  ?

## 2021-05-24 ENCOUNTER — Encounter: Payer: Self-pay | Admitting: Internal Medicine

## 2021-05-25 NOTE — Telephone Encounter (Signed)
Ok

## 2021-05-26 ENCOUNTER — Encounter: Payer: Self-pay | Admitting: Internal Medicine

## 2021-05-27 NOTE — Telephone Encounter (Signed)
Pt returning call

## 2021-05-27 NOTE — Telephone Encounter (Signed)
Ok to extend dates on a letter. Please print and I will sign.  Notify her if persistent issue - we may need to get a another appt and referral to counselor/psych ?

## 2021-05-28 NOTE — Telephone Encounter (Signed)
Patient informed of her letter. She will be by the office to pick up. ?

## 2021-08-08 ENCOUNTER — Other Ambulatory Visit: Payer: Self-pay | Admitting: Internal Medicine

## 2021-09-10 ENCOUNTER — Other Ambulatory Visit: Payer: Self-pay | Admitting: Internal Medicine

## 2021-09-13 ENCOUNTER — Encounter: Payer: Self-pay | Admitting: Internal Medicine

## 2021-09-14 NOTE — Telephone Encounter (Signed)
Regarding the referrals, I am not sure if she needs referrals to physicians she is already seeing.  I think that would need to be clarified with her insurance or the specialist office.  Do not mind placing referral if needed.  Also, regarding the place on her scalp, this will need dermatology evaluation for possible removal, etc.  Also, regarding the ophthalmology referral, I can place the order for the referral.  Does she know what office he is affiliated with.  Also, can schedule a f/u appt with me if needed as well.

## 2021-09-16 ENCOUNTER — Encounter: Payer: Self-pay | Admitting: Internal Medicine

## 2021-09-17 ENCOUNTER — Ambulatory Visit: Payer: Managed Care, Other (non HMO) | Admitting: Internal Medicine

## 2021-09-24 ENCOUNTER — Other Ambulatory Visit: Payer: Self-pay

## 2021-09-24 MED ORDER — FLUOXETINE HCL 20 MG PO CAPS: 20.0000 mg | ORAL_CAPSULE | Freq: Every day | ORAL | 3 refills | Status: DC

## 2021-10-15 ENCOUNTER — Ambulatory Visit (INDEPENDENT_AMBULATORY_CARE_PROVIDER_SITE_OTHER): Payer: Medicare Other | Admitting: Internal Medicine

## 2021-10-15 ENCOUNTER — Encounter: Payer: Self-pay | Admitting: Internal Medicine

## 2021-10-15 VITALS — BP 148/100 | HR 61 | Temp 98.3°F | Ht 68.0 in | Wt 206.8 lb

## 2021-10-15 DIAGNOSIS — R002 Palpitations: Secondary | ICD-10-CM

## 2021-10-15 DIAGNOSIS — F321 Major depressive disorder, single episode, moderate: Secondary | ICD-10-CM

## 2021-10-15 DIAGNOSIS — I471 Supraventricular tachycardia: Secondary | ICD-10-CM

## 2021-10-15 DIAGNOSIS — R7989 Other specified abnormal findings of blood chemistry: Secondary | ICD-10-CM | POA: Diagnosis not present

## 2021-10-15 DIAGNOSIS — E039 Hypothyroidism, unspecified: Secondary | ICD-10-CM | POA: Diagnosis not present

## 2021-10-15 DIAGNOSIS — H409 Unspecified glaucoma: Secondary | ICD-10-CM

## 2021-10-15 DIAGNOSIS — E7800 Pure hypercholesterolemia, unspecified: Secondary | ICD-10-CM

## 2021-10-15 DIAGNOSIS — Z008 Encounter for other general examination: Secondary | ICD-10-CM

## 2021-10-15 DIAGNOSIS — Z1231 Encounter for screening mammogram for malignant neoplasm of breast: Secondary | ICD-10-CM | POA: Diagnosis not present

## 2021-10-15 DIAGNOSIS — F439 Reaction to severe stress, unspecified: Secondary | ICD-10-CM

## 2021-10-15 DIAGNOSIS — M25561 Pain in right knee: Secondary | ICD-10-CM

## 2021-10-15 DIAGNOSIS — D509 Iron deficiency anemia, unspecified: Secondary | ICD-10-CM | POA: Diagnosis not present

## 2021-10-15 DIAGNOSIS — L989 Disorder of the skin and subcutaneous tissue, unspecified: Secondary | ICD-10-CM

## 2021-10-15 LAB — CBC WITH DIFFERENTIAL/PLATELET
Basophils Absolute: 0.1 10*3/uL (ref 0.0–0.1)
Basophils Relative: 1.1 % (ref 0.0–3.0)
Eosinophils Absolute: 0.2 10*3/uL (ref 0.0–0.7)
Eosinophils Relative: 2.5 % (ref 0.0–5.0)
HCT: 43.9 % (ref 36.0–46.0)
Hemoglobin: 14.7 g/dL (ref 12.0–15.0)
Lymphocytes Relative: 24.8 % (ref 12.0–46.0)
Lymphs Abs: 2 10*3/uL (ref 0.7–4.0)
MCHC: 33.5 g/dL (ref 30.0–36.0)
MCV: 93.1 fl (ref 78.0–100.0)
Monocytes Absolute: 0.4 10*3/uL (ref 0.1–1.0)
Monocytes Relative: 5.1 % (ref 3.0–12.0)
Neutro Abs: 5.3 10*3/uL (ref 1.4–7.7)
Neutrophils Relative %: 66.5 % (ref 43.0–77.0)
Platelets: 257 10*3/uL (ref 150.0–400.0)
RBC: 4.72 Mil/uL (ref 3.87–5.11)
RDW: 13.5 % (ref 11.5–15.5)
WBC: 8 10*3/uL (ref 4.0–10.5)

## 2021-10-15 LAB — LIPID PANEL
Cholesterol: 192 mg/dL (ref 0–200)
HDL: 49.8 mg/dL (ref >39.00)
LDL Cholesterol: 118 mg/dL — ABNORMAL HIGH (ref 0–99)
NonHDL: 141.71
Total CHOL/HDL Ratio: 4
Triglycerides: 117 mg/dL (ref 0.0–149.0)
VLDL: 23.4 mg/dL (ref 0.0–40.0)

## 2021-10-15 LAB — TSH: TSH: 2.2 u[IU]/mL (ref 0.35–5.50)

## 2021-10-15 LAB — BASIC METABOLIC PANEL
BUN: 11 mg/dL (ref 6–23)
CO2: 24 mEq/L (ref 19–32)
Calcium: 9.1 mg/dL (ref 8.4–10.5)
Chloride: 105 mEq/L (ref 96–112)
Creatinine, Ser: 0.7 mg/dL (ref 0.40–1.20)
GFR: 88.16 mL/min (ref >60.00)
Glucose, Bld: 91 mg/dL (ref 70–99)
Potassium: 4.3 mEq/L (ref 3.5–5.1)
Sodium: 138 mEq/L (ref 135–145)

## 2021-10-15 LAB — HEPATIC FUNCTION PANEL
ALT: 58 U/L — ABNORMAL HIGH (ref 0–35)
AST: 41 U/L — ABNORMAL HIGH (ref 0–37)
Albumin: 4.1 g/dL (ref 3.5–5.2)
Alkaline Phosphatase: 130 U/L — ABNORMAL HIGH (ref 39–117)
Bilirubin, Direct: 0.2 mg/dL (ref 0.0–0.3)
Total Bilirubin: 0.8 mg/dL (ref 0.2–1.2)
Total Protein: 6.5 g/dL (ref 6.0–8.3)

## 2021-10-15 NOTE — Progress Notes (Addendum)
Patient ID: Sydney Anderson, female   DOB: January 05, 1951, 71 y.o.   MRN: [id number]   Subjective:    Patient ID: Sydney Anderson, female    DOB: 08/03/1950, 71 y.o.   MRN: [id number]   Patient here for  Chief Complaint  Patient presents with   Follow-up    Would like to discuss referral for an eye specialist since changing to [employer].   Mass    Located on head has had for years but recently increased in size.  No pain or discharge.    Marland Kitchen   HPI Here to follow up regarding her blood pressure and increased stress.  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  This has decreased some of her stress.  Decreased her prozac down to 10mg  q day.  Request a referral to Dr - eye specialist.  No chest pain.  Breathing stable.  No increased cough or congestion.  No abdominal pain.  Bowels moving.  Did fall recently - slipped - toes under - landed on right knee.  Was getting better.  Started doing squats - aggravated.  Right medial pain - knee.  Wearing a brace.  Raised lesion on head - getting larger.  No pain.     Past Medical History:  Diagnosis Date   Arthritis    Asthma    Depression    Diverticulitis    GERD (gastroesophageal reflux disease)    Glaucoma    Hypertension    Hypothyroidism    Kidney stones    Migraines    Myopia of both eyes    Past Surgical History:  Procedure Laterality Date   accident  2005   EYE SURGERY  2010,2011   FOOT SURGERY  2006   kidney stones  1980   SHOULDER ADHESION RELEASE  2008   Family History  Problem Relation Age of Onset   Arthritis Mother    Lung cancer Mother    Mental illness Mother    Colon cancer Father    Mental illness Father    Heart attack Brother    Stroke Maternal Aunt    Heart disease Maternal Aunt    Heart disease Maternal Grandfather        Pacemaker   Stroke Maternal Grandfather    Breast cancer Paternal Grandmother 51   Social History   Socioeconomic History   Marital status: Married    Spouse name:  Not on file   Number of children: Not on file   Years of education: Not on file   Highest education level: Not on file  Occupational History   Not on file  Tobacco Use   Smoking status: Former    Types: Cigarettes    Quit date: 2009    Years since quitting: 14.7   Smokeless tobacco: Former    Quit date: 12/05/2007  Vaping Use   Vaping Use: Never used  Substance and Sexual Activity   Alcohol use: Yes    Alcohol/week: 15.0 standard drinks of alcohol    Types: 15 Standard drinks or equivalent per week    Comment: hard cider   Drug use: No   Sexual activity: Not on file  Other Topics Concern   Not on file  Social History Narrative   Not on file   Social Determinants of Health   Financial Resource Strain: Not on file  Food Insecurity: Not on file  Transportation Needs: Not on file  Physical Activity: Not on file  Stress: Not on file  Social Connections: Not on file     Review of Systems  Constitutional:  Negative for appetite change and unexpected weight change.  HENT:  Negative for congestion and sinus pressure.   Respiratory:  Negative for cough, chest tightness and shortness of breath.   Cardiovascular:  Negative for chest pain, palpitations and leg swelling.  Gastrointestinal:  Negative for abdominal pain, diarrhea, nausea and vomiting.  Genitourinary:  Negative for difficulty urinating and dysuria.  Musculoskeletal:  Negative for myalgias.       Knee pain as outlined.    Skin:  Negative for color change and rash.  Neurological:  Negative for dizziness, light-headedness and headaches.  Psychiatric/Behavioral:  Negative for agitation and dysphoric mood.        Objective:     BP (!) 148/100 (BP Location: Left Arm, Patient Position: Sitting, Cuff Size: Large)   Pulse 61   Temp 98.3 F (36.8 C) (Oral)   Ht 5\' 8"  (1.727 m)   Wt 206 lb 12.8 oz (93.8 kg)   SpO2 98%   BMI 31.44 kg/m  Wt Readings from Last 3 Encounters:  10/15/21 206 lb 12.8 oz (93.8 kg)   05/21/21 202 lb (91.6 kg)  12/04/20 202 lb (91.6 kg)    Physical Exam Vitals reviewed.  Constitutional:      General: She is not in acute distress.    Appearance: Normal appearance.  HENT:     Head: Normocephalic and atraumatic.     Right Ear: External ear normal.     Left Ear: External ear normal.  Eyes:     General: No scleral icterus.       Right eye: No discharge.        Left eye: No discharge.     Conjunctiva/sclera: Conjunctivae normal.  Neck:     Thyroid: No thyromegaly.  Cardiovascular:     Rate and Rhythm: Normal rate and regular rhythm.  Pulmonary:     Effort: No respiratory distress.     Breath sounds: Normal breath sounds. No wheezing.  Abdominal:     General: Bowel sounds are normal.     Palpations: Abdomen is soft.     Tenderness: There is no abdominal tenderness.  Musculoskeletal:        General: No swelling or tenderness.     Cervical back: Neck supple. No tenderness.  Lymphadenopathy:     Cervical: No cervical adenopathy.  Skin:    Findings: No erythema or rash.  Neurological:     Mental Status: She is alert.  Psychiatric:        Mood and Affect: Mood normal.        Behavior: Behavior normal.      Outpatient Encounter Medications as of 10/15/2021  Medication Sig   FLUoxetine (PROZAC) 10 MG tablet Take 1 tablet (10 mg total) by mouth daily.   levothyroxine (SYNTHROID) 75 MCG tablet TAKE 1 TABLET(75 MCG) BY MOUTH DAILY   methylcellulose (ARTIFICIAL TEARS) 1 % ophthalmic solution Place 1 drop into both eyes as needed.   prednisoLONE acetate (PRED FORTE) 1 % ophthalmic suspension APPLY 1 DROP TO THE RIGHT EYE 4 TIMES A DAY STARTING 2 DAYS BEFORE LASER TREATMENT   propafenone (RYTHMOL) 225 MG tablet Take 1 tablet (225 mg total) by mouth in the morning and at bedtime.   sodium chloride (OCEAN) 0.65 % nasal spray Place 1 spray into the nose as needed.   Timolol Maleate 0.5 % (DAILY) SOLN Place into the right eye daily.   VOLTAREN  1 % GEL Apply topically  as needed.   [DISCONTINUED] FLUoxetine (PROZAC) 20 MG capsule Take 1 capsule (20 mg total) by mouth daily. (Patient taking differently: Take 10 mg by mouth daily.)   No facility-administered encounter medications on file as of 10/15/2021.     Lab Results  Component Value Date   WBC 8.0 10/15/2021   HGB 14.7 10/15/2021   HCT 43.9 10/15/2021   PLT 257.0 10/15/2021   GLUCOSE 91 10/15/2021   CHOL 192 10/15/2021   TRIG 117.0 10/15/2021   HDL 49.80 10/15/2021   LDLCALC 118 (H) 10/15/2021   ALT 58 (H) 10/15/2021   AST 41 (H) 10/15/2021   NA 138 10/15/2021   K 4.3 10/15/2021   CL 105 10/15/2021   CREATININE 0.70 10/15/2021   BUN 11 10/15/2021   CO2 24 10/15/2021   TSH 2.20 10/15/2021   INR 1.0 11/07/2012    NM Myocar Multi W/Spect W/Wall Motion / EF  Result Date: 11/09/2020   The study is normal. The study is low risk.   No ST deviation was noted.   LV perfusion is normal. There is no evidence of ischemia. There is no evidence of infarction.   Left ventricular function is normal. End diastolic cavity size is normal. End systolic cavity size is normal.   EKG was not interpretable at peak stress due to significant motion artifact but no significant changes noted in recovery.   CT attenuation images showed no significant coronary calcifications.       Assessment & Plan:   Problem List Items Addressed This Visit     Abnormal liver function tests    Recheck liver panel       Relevant Orders   Hepatic function panel (Completed)   Depression, major, single episode, moderate (HCC)    Increased stress. Some better.  Appears to be handling things relatively well.  Does not feel needs any further intervention at this time.  Follow. Continue prozac 10mg  q day.       Relevant Medications   FLUoxetine (PROZAC) 10 MG tablet   Hypercholesteremia    Low-cholesterol diet and exercise.  Follow lipid panel.      Relevant Orders   Lipid panel (Completed)   Hypothyroidism    On thyroid  replacement.  Follow tsh.       Relevant Orders   TSH (Completed)   IDA (iron deficiency anemia)    Follow cbc and iron studies.       Relevant Orders   CBC with Differential/Platelet (Completed)   Palpitations    On propafenone.  Doing better.  Feels better.  Follow.       Right knee pain    Pain as outlined.  Discussed further w/up and evaluation.  Wearing a brace.  Will notify me if desires any further intervention.       Stress    Increased stress. Some better.  Appears to be handling things relatively well.  Does not feel needs any further intervention at this time.  Follow       SVT (supraventricular tachycardia) (HCC)    Saw cardiology.  On propafenone.  Doing better.  Follow       Other Visit Diagnoses     Encounter for screening mammogram for malignant neoplasm of breast    -  Primary   Relevant Orders   Basic metabolic panel (Completed)   MM 3D SCREEN BREAST BILATERAL   Encounter for biometric screening       Scalp  lesion       Relevant Orders   Ambulatory referral to Dermatology   Glaucoma, unspecified glaucoma type, unspecified laterality       Relevant Orders   Ambulatory referral to Ophthalmology        Dale Los Panes, MD

## 2021-10-16 ENCOUNTER — Telehealth: Payer: Self-pay | Admitting: Internal Medicine

## 2021-10-16 ENCOUNTER — Encounter: Payer: Self-pay | Admitting: Internal Medicine

## 2021-10-16 DIAGNOSIS — I471 Supraventricular tachycardia: Secondary | ICD-10-CM | POA: Insufficient documentation

## 2021-10-16 MED ORDER — FLUOXETINE HCL 10 MG PO TABS: 10.0000 mg | ORAL_TABLET | Freq: Every day | ORAL | 0 refills | Status: DC

## 2021-10-16 NOTE — Assessment & Plan Note (Signed)
Follow cbc and iron studies.  

## 2021-10-16 NOTE — Assessment & Plan Note (Signed)
On thyroid replacement.  Follow tsh.  

## 2021-10-16 NOTE — Assessment & Plan Note (Signed)
On propafenone.  Doing better.  Feels better.  Follow.

## 2021-10-16 NOTE — Addendum Note (Signed)
Addended by: Charm Barges on: 10/16/2021 09:41 PM   Modules accepted: Orders

## 2021-10-16 NOTE — Assessment & Plan Note (Signed)
Increased stress. Some better.  Appears to be handling things relatively well.  Does not feel needs any further intervention at this time.  Follow

## 2021-10-16 NOTE — Assessment & Plan Note (Signed)
Recheck liver panel.  

## 2021-10-16 NOTE — Assessment & Plan Note (Signed)
Pain as outlined.  Discussed further w/up and evaluation.  Wearing a brace.  Will notify me if desires any further intervention.

## 2021-10-16 NOTE — Assessment & Plan Note (Signed)
Saw cardiology.  On propafenone.  Doing better.  Follow

## 2021-10-16 NOTE — Telephone Encounter (Signed)
Please confirm with Sydney Anderson where Dr Gelene Mink is located.  She had requested to see him (eye md).  Need to confirm where located so can place order for referral.  Need contact information.

## 2021-10-16 NOTE — Assessment & Plan Note (Signed)
Low cholesterol diet and exercise.  Follow lipid panel.   

## 2021-10-16 NOTE — Assessment & Plan Note (Signed)
Increased stress. Some better.  Appears to be handling things relatively well.  Does not feel needs any further intervention at this time.  Follow. Continue prozac 10mg  q day.

## 2021-10-18 ENCOUNTER — Telehealth: Payer: Self-pay

## 2021-10-18 NOTE — Telephone Encounter (Signed)
Lvm for pt to return call.

## 2021-10-18 NOTE — Telephone Encounter (Addendum)
Lvm for pt to return call in regards to lab results.  Per Dr.Scott: Notify - cholesterol is relatively stable when compared to the previous check.  Liver tests elevated.   Given persistent elevation - I would like to refer her to GI for further evaluation.  If agreeable, let me know and I will place order for referral.  (Has seen [facility] previously).  Will refer to hold on recommending starting a cholesterol medication until can get liver issues sorted through. Hgb, thyroid test and kidney function tests are wnl.     &   Please confirm with Ms Labrecque where Dr Gelene Mink is located.  She had requested to see him (eye md).  Need to confirm where located so can place order for referral.  Need contact information.

## 2021-10-19 NOTE — Telephone Encounter (Signed)
Called pt x2 lvm x2 for pt to return call in regards to lab results. See note

## 2021-10-20 ENCOUNTER — Telehealth: Payer: Self-pay

## 2021-10-20 NOTE — Telephone Encounter (Signed)
error 

## 2021-10-22 NOTE — Addendum Note (Signed)
Addended by: Charm Barges on: 10/22/2021 11:12 PM   Modules accepted: Orders

## 2021-10-22 NOTE — Telephone Encounter (Signed)
Patient called note was read, at this time she does not want a referral to GI. The Eye doctor's phone number is [phone]. She thinks he is located in [place], but his a [facility] provider.

## 2021-10-22 NOTE — Telephone Encounter (Signed)
Order placed for referral to ophthalmology.   

## 2021-11-12 ENCOUNTER — Other Ambulatory Visit: Payer: Self-pay | Admitting: Cardiology

## 2021-11-12 NOTE — Telephone Encounter (Signed)
Good Afternoon,  Could you schedule a 12 month follow up visit for this patient with Dr. Quentin Ore? The patient was last seen by [facility] 12/04/20 and she wanted the patient to follow up in a year with Dr. Quentin Ore. Thank you so much.

## 2021-11-16 ENCOUNTER — Telehealth: Payer: Self-pay | Admitting: Internal Medicine

## 2021-11-16 NOTE — Telephone Encounter (Signed)
Patient called and said that the referral to [facility]. [place] is wrong. It is not for Glaucoma it is for Retina specialist. Please re-do referral to say that and fax to [phone] or [phone]

## 2021-11-16 NOTE — Telephone Encounter (Signed)
Ok to place order for retina specialist.

## 2021-11-16 NOTE — Telephone Encounter (Signed)
LMOV to schedule with Dr Quentin Ore

## 2021-11-17 ENCOUNTER — Other Ambulatory Visit: Payer: Self-pay

## 2021-11-17 DIAGNOSIS — H539 Unspecified visual disturbance: Secondary | ICD-10-CM

## 2021-11-17 NOTE — Telephone Encounter (Signed)
LM that I placed referral to retina specialist & she could call back if any issues.

## 2022-01-11 ENCOUNTER — Encounter: Payer: Self-pay | Admitting: Internal Medicine

## 2022-01-17 ENCOUNTER — Other Ambulatory Visit: Payer: Self-pay | Admitting: Cardiology

## 2022-01-18 NOTE — Telephone Encounter (Signed)
Please contact patient for overdue 6 month follow up. Last seen 10-28-20.  Mediaction refill pending appt.  Thank you

## 2022-01-20 NOTE — Telephone Encounter (Signed)
Propafenone 225 mg # 60 x 1 refill sen to   [id number] [place], [place] - [address] AT [facility]

## 2022-01-21 ENCOUNTER — Ambulatory Visit: Payer: Medicare Other | Admitting: Internal Medicine

## 2022-02-15 ENCOUNTER — Other Ambulatory Visit: Payer: Self-pay | Admitting: Internal Medicine

## 2022-03-04 ENCOUNTER — Ambulatory Visit: Payer: Medicare Other | Admitting: Cardiology

## 2022-03-04 DIAGNOSIS — H2512 Age-related nuclear cataract, left eye: Secondary | ICD-10-CM | POA: Diagnosis not present

## 2022-03-15 NOTE — Progress Notes (Deleted)
Electrophysiology Office Follow up Visit Note:    Date:  03/15/2022   ID:  Sydney Anderson, DOB 10-08-1950, MRN [id number]  PCP:  Einar Pheasant, MD  [facility] Cardiologist:  Nelva Bush, MD  [facility] Electrophysiologist:  Vickie Epley, MD    Interval History:    Sydney Anderson is a 72 y.o. female who presents for a follow up visit. They were last seen in clinic 10/28/2020 for SVT. At that appointment she also reported atypical chest pain. At that appointment I started propafenone. Stress test was performed after initiating rhythmol and showed no electrical disturbance while on the drug.        Past Medical History:  Diagnosis Date   Arthritis    Asthma    Depression    Diverticulitis    GERD (gastroesophageal reflux disease)    Glaucoma    Hypertension    Hypothyroidism    Kidney stones    Migraines    Myopia of both eyes     Past Surgical History:  Procedure Laterality Date   accident  2005   EYE SURGERY  2010,2011   FOOT SURGERY  2006   kidney stones  [place]  2008    Current Medications: No outpatient medications have been marked as taking for the 03/16/22 encounter (Appointment) with Vickie Epley, MD.     Allergies:   Horse-derived products and Tetanus toxoids   Social History   Socioeconomic History   Marital status: Married    Spouse name: Not on file   Number of children: Not on file   Years of education: Not on file   Highest education level: Not on file  Occupational History   Not on file  Tobacco Use   Smoking status: Former    Types: Cigarettes    Quit date: 2009    Years since quitting: 15.1   Smokeless tobacco: Former    Quit date: 12/05/2007  Vaping Use   Vaping Use: Never used  Substance and Sexual Activity   Alcohol use: Yes    Alcohol/week: 15.0 standard drinks of alcohol    Types: 15 Standard drinks or equivalent per week    Comment: hard cider   Drug use: No    Sexual activity: Not on file  Other Topics Concern   Not on file  Social History Narrative   Not on file   Social Determinants of Health   Financial Resource Strain: Not on file  Food Insecurity: Not on file  Transportation Needs: Not on file  Physical Activity: Not on file  Stress: Not on file  Social Connections: Not on file     Family History: The patient's family history includes Arthritis in her mother; Breast cancer (age of onset: 74) in her paternal grandmother; Colon cancer in her father; Heart attack in her brother; Heart disease in her maternal aunt and maternal grandfather; Lung cancer in her mother; Mental illness in her father and mother; Stroke in her maternal aunt and maternal grandfather.  ROS:   Please see the history of present illness.    All other systems reviewed and are negative.  EKGs/Labs/Other Studies Reviewed:    The following studies were reviewed today:  11/09/2020 SPECT   The study is normal. The study is low risk.   No ST deviation was noted.   LV perfusion is normal. There is no evidence of ischemia. There is no evidence of infarction.   Left ventricular function is  normal. End diastolic cavity size is normal. End systolic cavity size is normal.   EKG was not interpretable at peak stress due to significant motion artifact but no significant changes noted in recovery.   CT attenuation images showed no significant coronary calcifications.    EKG:  The ekg ordered today demonstrates ***  Recent Labs: 10/15/2021: ALT 58; BUN 11; Creatinine, Ser 0.70; Hemoglobin 14.7; Platelets 257.0; Potassium 4.3; Sodium 138; TSH 2.20  Recent Lipid Panel    Component Value Date/Time   CHOL 192 10/15/2021 1148   CHOL 173 08/11/2020 0900   TRIG 117.0 10/15/2021 1148   HDL 49.80 10/15/2021 1148   HDL 55 08/11/2020 0900   CHOLHDL 4 10/15/2021 1148   VLDL 23.4 10/15/2021 1148   LDLCALC 118 (H) 10/15/2021 1148   LDLCALC 97 08/11/2020 0900    Physical Exam:     VS:  There were no vitals taken for this visit.    Wt Readings from Last 3 Encounters:  10/15/21 206 lb 12.8 oz (93.8 kg)  05/21/21 202 lb (91.6 kg)  12/04/20 202 lb (91.6 kg)     GEN: *** Well nourished, well developed in no acute distress CARDIAC: ***RRR, no murmurs, rubs, gallops RESPIRATORY:  Clear to auscultation without rales, wheezing or rhonchi        ASSESSMENT:    1. SVT (supraventricular tachycardia)    PLAN:    In order of problems listed above:   #SVT #Propafenone monitoring Doing well on propafenone. Continue the current dose.    Follow up 6 months with APP.     Medication Adjustments/Labs and Tests Ordered: Current medicines are reviewed at length with the patient today.  Concerns regarding medicines are outlined above.  No orders of the defined types were placed in this encounter.  No orders of the defined types were placed in this encounter.    Signed, Lars Mage, MD, [facility], [facility] 03/15/2022 3:44 PM    Electrophysiology [facility]

## 2022-03-16 ENCOUNTER — Ambulatory Visit: Payer: Medicare Other | Admitting: Cardiology

## 2022-03-16 DIAGNOSIS — Z79899 Other long term (current) drug therapy: Secondary | ICD-10-CM

## 2022-03-16 DIAGNOSIS — I471 Supraventricular tachycardia, unspecified: Secondary | ICD-10-CM

## 2022-03-20 ENCOUNTER — Other Ambulatory Visit: Payer: Self-pay | Admitting: Cardiology

## 2022-03-24 ENCOUNTER — Other Ambulatory Visit: Payer: Self-pay

## 2022-03-24 MED ORDER — PROPAFENONE HCL 225 MG PO TABS: ORAL_TABLET | ORAL | 0 refills | Status: DC

## 2022-04-13 ENCOUNTER — Ambulatory Visit: Payer: Medicare Other | Admitting: Cardiology

## 2022-04-19 ENCOUNTER — Other Ambulatory Visit: Payer: Self-pay | Admitting: Cardiology

## 2022-04-20 ENCOUNTER — Encounter: Payer: Self-pay | Admitting: Internal Medicine

## 2022-04-20 ENCOUNTER — Ambulatory Visit (INDEPENDENT_AMBULATORY_CARE_PROVIDER_SITE_OTHER): Payer: Medicare Other | Admitting: Internal Medicine

## 2022-04-20 DIAGNOSIS — E7800 Pure hypercholesterolemia, unspecified: Secondary | ICD-10-CM

## 2022-04-20 NOTE — Progress Notes (Deleted)
Subjective:    Patient ID: Sydney Anderson, female    DOB: 1950/07/10, 72 y.o.   MRN: [id number]  Patient here for No chief complaint on file.   HPI Here for physical exam.  Previous right knee pain.  Previous head lesion.    Past Medical History:  Diagnosis Date   Arthritis    Asthma    Depression    Diverticulitis    GERD (gastroesophageal reflux disease)    Glaucoma    Hypertension    Hypothyroidism    Kidney stones    Migraines    Myopia of both eyes    Past Surgical History:  Procedure Laterality Date   accident  2005   EYE SURGERY  2010,2011   FOOT SURGERY  2006   kidney stones  1980   SHOULDER ADHESION RELEASE  2008   Family History  Problem Relation Age of Onset   Arthritis Mother    Lung cancer Mother    Mental illness Mother    Colon cancer Father    Mental illness Father    Heart attack Brother    Stroke Maternal Aunt    Heart disease Maternal Aunt    Heart disease Maternal Grandfather        Pacemaker   Stroke Maternal Grandfather    Breast cancer Paternal Grandmother 75   Social History   Socioeconomic History   Marital status: Married    Spouse name: Not on file   Number of children: Not on file   Years of education: Not on file   Highest education level: Not on file  Occupational History   Not on file  Tobacco Use   Smoking status: Former    Types: Cigarettes    Quit date: 2009    Years since quitting: 15.2   Smokeless tobacco: Former    Quit date: 12/05/2007  Vaping Use   Vaping Use: Never used  Substance and Sexual Activity   Alcohol use: Yes    Alcohol/week: 15.0 standard drinks of alcohol    Types: 15 Standard drinks or equivalent per week    Comment: hard cider   Drug use: No   Sexual activity: Not on file  Other Topics Concern   Not on file  Social History Narrative   Not on file   Social Determinants of Health   Financial Resource Strain: Not on file  Food Insecurity: Not on file  Transportation Needs:  Not on file  Physical Activity: Not on file  Stress: Not on file  Social Connections: Not on file     Review of Systems     Objective:     There were no vitals taken for this visit. Wt Readings from Last 3 Encounters:  10/15/21 206 lb 12.8 oz (93.8 kg)  05/21/21 202 lb (91.6 kg)  12/04/20 202 lb (91.6 kg)    Physical Exam   Outpatient Encounter Medications as of 04/20/2022  Medication Sig   FLUoxetine (PROZAC) 10 MG tablet Take 1 tablet (10 mg total) by mouth daily.   levothyroxine (SYNTHROID) 75 MCG tablet TAKE 1 TABLET(75 MCG) BY MOUTH DAILY   methylcellulose (ARTIFICIAL TEARS) 1 % ophthalmic solution Place 1 drop into both eyes as needed.   prednisoLONE acetate (PRED FORTE) 1 % ophthalmic suspension APPLY 1 DROP TO THE RIGHT EYE 4 TIMES A DAY STARTING 2 DAYS BEFORE LASER TREATMENT   propafenone (RYTHMOL) 225 MG tablet TAKE 1 TABLET BY MOUTH EVERY MORNING AND TAKE 1 TABLET BY MOUTH  AT BEDTIME   sodium chloride (OCEAN) 0.65 % nasal spray Place 1 spray into the nose as needed.   Timolol Maleate 0.5 % (DAILY) SOLN Place into the right eye daily.   VOLTAREN 1 % GEL Apply topically as needed.   No facility-administered encounter medications on file as of 04/20/2022.     Lab Results  Component Value Date   WBC 8.0 10/15/2021   HGB 14.7 10/15/2021   HCT 43.9 10/15/2021   PLT 257.0 10/15/2021   GLUCOSE 91 10/15/2021   CHOL 192 10/15/2021   TRIG 117.0 10/15/2021   HDL 49.80 10/15/2021   LDLCALC 118 (H) 10/15/2021   ALT 58 (H) 10/15/2021   AST 41 (H) 10/15/2021   NA 138 10/15/2021   K 4.3 10/15/2021   CL 105 10/15/2021   CREATININE 0.70 10/15/2021   BUN 11 10/15/2021   CO2 24 10/15/2021   TSH 2.20 10/15/2021   INR 1.0 11/07/2012    NM Myocar Multi W/Spect W/Wall Motion / EF  Result Date: 11/09/2020   The study is normal. The study is low risk.   No ST deviation was noted.   LV perfusion is normal. There is no evidence of ischemia. There is no evidence of  infarction.   Left ventricular function is normal. End diastolic cavity size is normal. End systolic cavity size is normal.   EKG was not interpretable at peak stress due to significant motion artifact but no significant changes noted in recovery.   CT attenuation images showed no significant coronary calcifications.       Assessment & Plan:  Hypercholesteremia     Einar Pheasant, MD

## 2022-04-22 ENCOUNTER — Encounter: Payer: Self-pay | Admitting: Internal Medicine

## 2022-04-22 NOTE — Progress Notes (Signed)
Patient ID: Sydney Anderson, female   DOB: 1950/03/05, 72 y.o.   MRN: [id number] Pt no showed for appt.

## 2022-04-23 ENCOUNTER — Other Ambulatory Visit: Payer: Self-pay | Admitting: Cardiology

## 2022-05-11 ENCOUNTER — Ambulatory Visit: Payer: Medicare Other | Admitting: Cardiology

## 2022-05-24 DIAGNOSIS — H2512 Age-related nuclear cataract, left eye: Secondary | ICD-10-CM | POA: Diagnosis not present

## 2022-06-07 NOTE — Progress Notes (Deleted)
Cardiology Office Note Date:  06/07/2022  Patient ID:  Sydney, Anderson December 11, 1950, MRN [id number] PCP:  Dale Robinhood, MD  Cardiologist:  Yvonne Kendall, MD Electrophysiologist: Lanier Prude, MD  ***refresh   Chief Complaint: palpitations, overdue follow-up  History of Present Illness: Sydney Anderson is a 72 y.o. female with PMH notable for HTN, hypothyroid, atach, PACs; seen today for Lanier Prude, MD for routine electrophysiology followup.  Last saw Dr. Lalla Brothers 11/2020 for palpitation evaluation. Had worn a zio that showed SVT burden. He started her on propafenone  *** chest pain *** palpitation burden *** how taking meds  Since last being seen in our clinic the patient reports doing ***.  she denies chest pain, palpitations, dyspnea, PND, orthopnea, nausea, vomiting, dizziness, syncope, edema, weight gain, or early satiety.      AAD History: Propafenone, started 10/2020  Past Medical History:  Diagnosis Date   Arthritis    Asthma    Depression    Diverticulitis    GERD (gastroesophageal reflux disease)    Glaucoma    Hypertension    Hypothyroidism    Kidney stones    Migraines    Myopia of both eyes     Past Surgical History:  Procedure Laterality Date   accident  2005   EYE SURGERY  2010,2011   FOOT SURGERY  2006   kidney stones  1980   SHOULDER ADHESION RELEASE  2008    Current Outpatient Medications  Medication Instructions   FLUoxetine (PROZAC) 10 mg, Oral, Daily   levothyroxine (SYNTHROID) 75 MCG tablet TAKE 1 TABLET(75 MCG) BY MOUTH DAILY   methylcellulose (ARTIFICIAL TEARS) 1 % ophthalmic solution 1 drop, As needed   prednisoLONE acetate (PRED FORTE) 1 % ophthalmic suspension APPLY 1 DROP TO THE RIGHT EYE 4 TIMES A DAY STARTING 2 DAYS BEFORE LASER TREATMENT   propafenone (RYTHMOL) 225 MG tablet TAKE 1 TABLET BY MOUTH EVERY MORNING AND TAKE 1 TABLET BY MOUTH AT BEDTIME   sodium chloride (OCEAN) 0.65 % nasal spray 1  spray, As needed   Timolol Maleate 0.5 % (DAILY) SOLN Right Eye, Daily   VOLTAREN 1 % GEL Topical, As needed    Social History:  The patient  reports that she quit smoking about 15 years ago. Her smoking use included cigarettes. She quit smokeless tobacco use about 14 years ago. She reports current alcohol use of about 15.0 standard drinks of alcohol per week. She reports that she does not use drugs.   Family History:  *** include only if pertinent The patient's family history includes Arthritis in her mother; Breast cancer (age of onset: 41) in her paternal grandmother; Colon cancer in her father; Heart attack in her brother; Heart disease in her maternal aunt and maternal grandfather; Lung cancer in her mother; Mental illness in her father and mother; Stroke in her maternal aunt and maternal grandfather.***  ROS:  Please see the history of present illness. All other systems are reviewed and otherwise negative.   PHYSICAL EXAM: *** VS:  There were no vitals taken for this visit. BMI: There is no height or weight on file to calculate BMI.  GEN- The patient is well appearing, alert and oriented x 3 today.   Lungs- Clear to ausculation bilaterally, normal work of breathing.  Heart- {Blank single:19197::"Regular","Irregularly irregular"} rate and rhythm, no murmurs, rubs or gallops Extremities- {EDEMA LEVEL:28147::"No"} peripheral edema, warm, dry   EKG is ordered. Personal review of EKG from today shows:  ***  Recent Labs: 10/15/2021: ALT 58; BUN 11; Creatinine, Ser 0.70; Hemoglobin 14.7; Platelets 257.0; Potassium 4.3; Sodium 138; TSH 2.20  10/15/2021: Cholesterol 192; HDL 49.80; LDL Cholesterol 118; Total CHOL/HDL Ratio 4; Triglycerides 117.0; VLDL 23.4   CrCl cannot be calculated (Patient's most recent lab result is older than the maximum 21 days allowed.).   Wt Readings from Last 3 Encounters:  10/15/21 206 lb 12.8 oz (93.8 kg)  05/21/21 202 lb (91.6 kg)  12/04/20 202 lb (91.6 kg)      Additional studies reviewed include: Previous EP, cardiology notes.   NM myoview, 11/09/2020   The study is normal. The study is low risk.   No ST deviation was noted.   LV perfusion is normal. There is no evidence of ischemia. There is no evidence of infarction.   Left ventricular function is normal. End diastolic cavity size is normal. End systolic cavity size is normal.   EKG was not interpretable at peak stress due to significant motion artifact but no significant changes noted in recovery.   CT attenuation images showed no significant coronary calcifications.  TTE, 08/26/2020  1. Left ventricular ejection fraction, by estimation, is 65 to 70%. The left ventricle has normal function. The left ventricle has no regional wall motion abnormalities. Left ventricular diastolic parameters were normal.   2. Right ventricular systolic function is normal. The right ventricular size is normal.   3. The mitral valve is normal in structure. No evidence of mitral valve regurgitation.   4. The aortic valve was not well visualized. Aortic valve regurgitation is not visualized.   5. The inferior vena cava is normal in size with greater than 50% respiratory variability, suggesting right atrial pressure of 3 mmHg.   Long term monitor, 08/06/2020 The patient was monitored for 14 days. The predominant rhythm was sinus with an average rate of 66 bpm (range 43-120 bpm and sinus). There were occasional PACs and rare PVCs. 483 atrial runs occurred, lasting up to 17.9 seconds with a maximum rate of 182 bpm. There was no sustained arrhythmia or prolonged pause. Patient triggered events corresponded to normal sinus rhythm, PACs, and PVCs.   ASSESSMENT AND PLAN:  #) Palpitations    #) ***    Current medicines are reviewed at length with the patient today.   The patient {ACTIONS; HAS/DOES NOT HAVE:19233} concerns regarding her medicines.  The following changes were made today:  {NONE  DEFAULTED:18576}  Labs/ tests ordered today include: *** No orders of the defined types were placed in this encounter.    Disposition: Follow up with {EPMDS:28135} in {EPFOLLOW UP:28173}   Signed, Sherie Don, NP  06/07/22  1:08 PM  Electrophysiology [facility]

## 2022-06-08 ENCOUNTER — Ambulatory Visit: Payer: Medicare Other | Admitting: Cardiology

## 2022-06-08 DIAGNOSIS — R002 Palpitations: Secondary | ICD-10-CM

## 2022-06-08 DIAGNOSIS — I471 Supraventricular tachycardia, unspecified: Secondary | ICD-10-CM

## 2022-06-13 ENCOUNTER — Telehealth: Payer: Self-pay | Admitting: Internal Medicine

## 2022-06-13 NOTE — Telephone Encounter (Signed)
Copied from CRM [id number]. Topic: Medicare AWV >> Jun 13, 2022 11:49 AM Payton Doughty wrote: Reason for CRM: Called patient to schedule Medicare Annual Wellness Visit (AWV). Left message for patient to call back and schedule Medicare Annual Wellness Visit (AWV).  Last date of AWV: NONE  Please schedule an appointment at any time with [facility].  If any questions, please contact me.  Thank you ,  Verlee Rossetti; Care Guide Ambulatory Clinical Support [facility] Direct Dial: [phone]

## 2022-06-20 ENCOUNTER — Encounter: Payer: Self-pay | Admitting: Ophthalmology

## 2022-06-23 ENCOUNTER — Other Ambulatory Visit: Payer: Self-pay | Admitting: Cardiology

## 2022-06-23 NOTE — Telephone Encounter (Signed)
Good Morning, Please advise if a refill should be given? The patient was last seen by Dr. Lalla Brothers on 10-28-20. The patient has an appointment scheduled with  Suzann on 07-06-22, but multiple appointments have been cancelled in the past. Thank you so much.

## 2022-06-24 NOTE — Discharge Instructions (Signed)

## 2022-06-27 ENCOUNTER — Ambulatory Visit
Admission: RE | Admit: 2022-06-27 | Discharge: 2022-06-27 | Disposition: A | Discharge status: Home / Self Care | Payer: Medicare Other | Attending: Ophthalmology | Admitting: Ophthalmology

## 2022-06-27 ENCOUNTER — Encounter: Payer: Self-pay | Admitting: Ophthalmology

## 2022-06-27 ENCOUNTER — Ambulatory Visit: Payer: Medicare Other | Admitting: Anesthesiology

## 2022-06-27 ENCOUNTER — Other Ambulatory Visit: Payer: Self-pay

## 2022-06-27 ENCOUNTER — Encounter
Admission: RE | Disposition: A | Discharge status: Home / Self Care | Payer: Self-pay | Source: Home / Self Care | Attending: Ophthalmology

## 2022-06-27 DIAGNOSIS — I471 Supraventricular tachycardia, unspecified: Secondary | ICD-10-CM | POA: Diagnosis not present

## 2022-06-27 DIAGNOSIS — E039 Hypothyroidism, unspecified: Secondary | ICD-10-CM | POA: Diagnosis not present

## 2022-06-27 DIAGNOSIS — M1990 Unspecified osteoarthritis, unspecified site: Secondary | ICD-10-CM | POA: Insufficient documentation

## 2022-06-27 DIAGNOSIS — F32A Depression, unspecified: Secondary | ICD-10-CM | POA: Insufficient documentation

## 2022-06-27 DIAGNOSIS — H2512 Age-related nuclear cataract, left eye: Secondary | ICD-10-CM | POA: Diagnosis not present

## 2022-06-27 DIAGNOSIS — I10 Essential (primary) hypertension: Secondary | ICD-10-CM | POA: Insufficient documentation

## 2022-06-27 DIAGNOSIS — J45909 Unspecified asthma, uncomplicated: Secondary | ICD-10-CM | POA: Insufficient documentation

## 2022-06-27 DIAGNOSIS — H269 Unspecified cataract: Secondary | ICD-10-CM | POA: Diagnosis not present

## 2022-06-27 DIAGNOSIS — G709 Myoneural disorder, unspecified: Secondary | ICD-10-CM | POA: Diagnosis not present

## 2022-06-27 DIAGNOSIS — Z8261 Family history of arthritis: Secondary | ICD-10-CM | POA: Insufficient documentation

## 2022-06-27 DIAGNOSIS — Z8249 Family history of ischemic heart disease and other diseases of the circulatory system: Secondary | ICD-10-CM | POA: Insufficient documentation

## 2022-06-27 DIAGNOSIS — Z87891 Personal history of nicotine dependence: Secondary | ICD-10-CM | POA: Insufficient documentation

## 2022-06-27 HISTORY — DX: Spinal stenosis, site unspecified: M48.00

## 2022-06-27 HISTORY — PX: CATARACT EXTRACTION W/PHACO: SHX586

## 2022-06-27 HISTORY — DX: Other supraventricular tachycardia: I47.19

## 2022-06-27 SURGERY — PHACOEMULSIFICATION, CATARACT, WITH IOL INSERTION
Anesthesia: Monitor Anesthesia Care | Laterality: Left

## 2022-06-27 MED ORDER — TETRACAINE HCL 0.5 % OP SOLN
1.0000 [drp] | OPHTHALMIC | Status: DC | PRN
Start: 2022-06-27 — End: 2022-06-27
  Administered 2022-06-27 (×3): 1 [drp] via OPHTHALMIC

## 2022-06-27 MED ORDER — MIDAZOLAM HCL 2 MG/2ML IJ SOLN
INTRAMUSCULAR | Status: DC | PRN
  Administered 2022-06-27: 2 mg via INTRAVENOUS

## 2022-06-27 MED ORDER — SIGHTPATH DOSE#1 BSS IO SOLN
INTRAOCULAR | Status: DC | PRN
  Administered 2022-06-27: 15 mL via INTRAOCULAR

## 2022-06-27 MED ORDER — MOXIFLOXACIN HCL 0.5 % OP SOLN
OPHTHALMIC | Status: DC | PRN
  Administered 2022-06-27: .2 mL via OPHTHALMIC

## 2022-06-27 MED ORDER — ARMC OPHTHALMIC DILATING DROPS
1.0000 | OPHTHALMIC | Status: DC | PRN
  Administered 2022-06-27 (×3): 1 via OPHTHALMIC

## 2022-06-27 MED ORDER — LIDOCAINE HCL (PF) 2 % IJ SOLN
INTRAOCULAR | Status: DC | PRN
  Administered 2022-06-27: 4 mL via INTRAOCULAR

## 2022-06-27 MED ORDER — FENTANYL CITRATE (PF) 100 MCG/2ML IJ SOLN
INTRAMUSCULAR | Status: DC | PRN
  Administered 2022-06-27: 50 ug via INTRAVENOUS

## 2022-06-27 MED ORDER — SIGHTPATH DOSE#1 BSS IO SOLN: INTRAOCULAR | Status: DC | PRN

## 2022-06-27 MED ORDER — SIGHTPATH DOSE#1 NA HYALUR & NA CHOND-NA HYALUR IO KIT
PACK | INTRAOCULAR | Status: DC | PRN
  Administered 2022-06-27: 1 via OPHTHALMIC

## 2022-06-27 MED ORDER — LACTATED RINGERS IV SOLN: INTRAVENOUS | Status: DC

## 2022-06-27 SURGICAL SUPPLY — 18 items
CANNULA ANT/CHMB 27G (MISCELLANEOUS) IMPLANT
CANNULA ANT/CHMB 27GA (MISCELLANEOUS) IMPLANT
CATARACT SUITE SIGHTPATH (MISCELLANEOUS) ×1 IMPLANT
DISSECTOR HYDRO NUCLEUS 50X22 (MISCELLANEOUS) ×1 IMPLANT
FEE CATARACT SUITE SIGHTPATH (MISCELLANEOUS) ×1 IMPLANT
GLOVE SURG GAMMEX PI TX LF 7.5 (GLOVE) ×1 IMPLANT
GLOVE SURG SYN 8.5  E (GLOVE) ×1
GLOVE SURG SYN 8.5 E (GLOVE) ×1 IMPLANT
GLOVE SURG SYN 8.5 PF PI (GLOVE) ×1 IMPLANT
LENS IOL TECNIS EYHANCE 13.5 (Intraocular Lens) IMPLANT
NDL FILTER BLUNT 18X1 1/2 (NEEDLE) ×1 IMPLANT
NEEDLE FILTER BLUNT 18X1 1/2 (NEEDLE) ×1 IMPLANT
PACK VIT ANT 23G (MISCELLANEOUS) IMPLANT
RING MALYGIN (MISCELLANEOUS) IMPLANT
SUT ETHILON 10-0 CS-B-6CS-B-6 (SUTURE)
SUTURE EHLN 10-0 CS-B-6CS-B-6 (SUTURE) IMPLANT
SYR 3ML LL SCALE MARK (SYRINGE) ×1 IMPLANT
SYR 5ML LL (SYRINGE) ×1 IMPLANT

## 2022-06-27 NOTE — Op Note (Signed)
OPERATIVE NOTE  Sydney Anderson [id number] 06/27/2022   PREOPERATIVE DIAGNOSIS:  Nuclear sclerotic cataract left eye.  H25.12   POSTOPERATIVE DIAGNOSIS:    Nuclear sclerotic cataract left eye.     PROCEDURE:  Phacoemusification with posterior chamber intraocular lens placement of the left eye   LENS:   Implant Name Type Inv. Item Serial No. Manufacturer Lot No. LRB No. Used Action  LENS IOL TECNIS EYHANCE 13.5 - [id number] Intraocular Lens LENS IOL TECNIS EYHANCE 13.5 [id number] [employer]  Left 1 Implanted      Procedure(s): CATARACT EXTRACTION PHACO AND INTRAOCULAR LENS PLACEMENT (IOC) LEFT 12.27 00:54.8 (Left)  DIB00 +13.5   ULTRASOUND TIME: 0 minutes 54 seconds.  CDE 12.27   SURGEON:  Willey Blade, MD, MPH   ANESTHESIA:  Topical with tetracaine drops augmented with 1% preservative-free intracameral lidocaine.  ESTIMATED BLOOD LOSS: <1 mL   COMPLICATIONS:  None.   DESCRIPTION OF PROCEDURE:  The patient was identified in the holding room and transported to the operating room and placed in the supine position under the operating microscope.  The left eye was identified as the operative eye and it was prepped and draped in the usual sterile ophthalmic fashion.   A 1.0 millimeter clear-corneal paracentesis was made at the 5:00 position. 0.5 ml of preservative-free 1% lidocaine with epinephrine was injected into the anterior chamber.  The anterior chamber was filled with viscoelastic.  A 2.4 millimeter keratome was used to make a near-clear corneal incision at the 2:00 position.  A curvilinear capsulorrhexis was made with a cystotome and capsulorrhexis forceps.  Balanced salt solution was used to hydrodissect and hydrodelineate the nucleus.   Phacoemulsification was then used in stop and chop fashion to remove the lens nucleus and epinucleus.  The remaining cortex was then removed using the irrigation and aspiration handpiece. Viscoelastic was then placed into the capsular bag  to distend it for lens placement.  A lens was then injected into the capsular bag.  The remaining viscoelastic was aspirated.   Wounds were hydrated with balanced salt solution.  The anterior chamber was inflated to a physiologic pressure with balanced salt solution.  Intracameral vigamox 0.1 mL undiltued was injected into the eye and a drop placed onto the ocular surface.  No wound leaks were noted.  The patient was taken to the recovery room in stable condition without complications of anesthesia or surgery  Willey Blade 06/27/2022, 9:23 AM

## 2022-06-27 NOTE — H&P (Signed)
[facility]   Primary Care Physician:  Dale [place], MD Ophthalmologist: Dr. Willey Blade  Pre-Procedure History & Physical: HPI:  Sydney Anderson is a 72 y.o. female here for cataract surgery.   Past Medical History:  Diagnosis Date   Arthritis    Asthma    Atrial tachycardia    Depression    Diverticulitis    GERD (gastroesophageal reflux disease)    Glaucoma    Hypertension    No issues since quit smoking   Hypothyroidism    Kidney stones    Migraines    None for several years   Myopia of both eyes    Spinal stenosis     Past Surgical History:  Procedure Laterality Date   accident  2005   EYE SURGERY  2010,2011   FOOT SURGERY  2006   kidney stones  1980   SHOULDER ADHESION RELEASE  2008    Prior to Admission medications   Medication Sig Start Date End Date Taking? Authorizing Provider  FLUoxetine (PROZAC) 10 MG tablet Take 1 tablet (10 mg total) by mouth daily. Patient taking differently: Take 10 mg by mouth every other day. 10/16/21  Yes Dale Welda, MD  levothyroxine (SYNTHROID) 75 MCG tablet TAKE 1 TABLET(75 MCG) BY MOUTH DAILY 02/15/22  Yes Dale Nelson, MD  methylcellulose (ARTIFICIAL TEARS) 1 % ophthalmic solution Place 1 drop into both eyes as needed.   Yes [provider]  prednisoLONE acetate (PRED FORTE) 1 % ophthalmic suspension APPLY 1 DROP TO THE RIGHT EYE 4 TIMES A DAY STARTING 2 DAYS BEFORE LASER TREATMENT 10/31/16  Yes [provider]  propafenone (RYTHMOL) 225 MG tablet TAKE 1 TABLET BY MOUTH EVERY MORNING AND TAKE 1 TABLET BY MOUTH AT BEDTIME. No further refills will be given without an office visit. 06/23/22  Yes Lanier Prude, MD  sodium chloride (OCEAN) 0.65 % nasal spray Place 1 spray into the nose as needed.   Yes [provider]  Timolol Maleate 0.5 % (DAILY) SOLN Place into the right eye daily. 04/24/18  Yes [provider]  VOLTAREN 1 % GEL Apply topically as needed. 09/01/14  Yes  [provider]    Allergies as of 03/15/2022 - Review Complete 10/16/2021  Allergen Reaction Noted   Horse-derived products Anaphylaxis and Swelling 12/05/2011   Tetanus toxoids  12/06/2011    Family History  Problem Relation Age of Onset   Arthritis Mother    Lung cancer Mother    Mental illness Mother    Colon cancer Father    Mental illness Father    Heart attack Brother    Stroke Maternal Aunt    Heart disease Maternal Aunt    Heart disease Maternal Grandfather        Pacemaker   Stroke Maternal Grandfather    Breast cancer Paternal Grandmother 4    Social History   Socioeconomic History   Marital status: Married    Spouse name: Not on file   Number of children: Not on file   Years of education: Not on file   Highest education level: Not on file  Occupational History   Not on file  Tobacco Use   Smoking status: Former    Types: Cigarettes    Quit date: 2009    Years since quitting: 15.3   Smokeless tobacco: Never  Vaping Use   Vaping Use: Never used  Substance and Sexual Activity   Alcohol use: Yes    Alcohol/week: 15.0 standard  drinks of alcohol    Types: 15 Standard drinks or equivalent per week    Comment: hard cider   Drug use: No   Sexual activity: Not on file  Other Topics Concern   Not on file  Social History Narrative   Not on file   Social Determinants of Health   Financial Resource Strain: Not on file  Food Insecurity: Not on file  Transportation Needs: Not on file  Physical Activity: Not on file  Stress: Not on file  Social Connections: Not on file  Intimate Partner Violence: Not on file    Review of Systems: See HPI, otherwise negative ROS  Physical Exam: BP 137/81   Pulse 61   Temp 98.1 F (36.7 C) (Temporal)   Resp 17   Ht 5\' 7"  (1.702 m)   Wt 92.5 kg   SpO2 97%   BMI 31.95 kg/m  General:   Alert, cooperative in NAD Head:  Normocephalic and atraumatic. Respiratory:  Normal work of  breathing. Cardiovascular:  RRR  Impression/Plan: Sydney Anderson is here for cataract surgery.  Risks, benefits, limitations, and alternatives regarding cataract surgery have been reviewed with the patient.  Questions have been answered.  All parties agreeable.   Willey Blade, MD  06/27/2022, 8:56 AM

## 2022-06-27 NOTE — Anesthesia Preprocedure Evaluation (Addendum)
Anesthesia Evaluation  Patient identified by MRN, date of birth, ID band Patient awake    Reviewed: Allergy & Precautions, H&P , NPO status , Patient's Chart, lab work & pertinent test results  Airway Mallampati: II  TM Distance: >3 FB Neck ROM: Full    Dental no notable dental hx. (+) Poor Dentition, Chipped   Pulmonary shortness of breath, asthma , former smoker   Pulmonary exam normal breath sounds clear to auscultation       Cardiovascular hypertension, Normal cardiovascular exam+ dysrhythmias  Rhythm:Regular Rate:Normal  Echo 08-26-20 EF 65-70%, no RWMA   Neuro/Psych  Headaches PSYCHIATRIC DISORDERS  Depression     Neuromuscular disease  negative psych ROS   GI/Hepatic Neg liver ROS,GERD  ,,  Endo/Other  Hypothyroidism    Renal/GU Renal disease  negative genitourinary   Musculoskeletal negative musculoskeletal ROS (+) Arthritis ,    Abdominal   Peds negative pediatric ROS (+)  Hematology  (+) Blood dyscrasia, anemia   Anesthesia Other Findings Asthma  Arthritis Depression  GERD (gastroesophageal reflux disease) Hypertension  Diverticulitis Kidney stones  Migraines Hypothyroidism  Myopia of both eyes Glaucoma  Atrial tachycardia Spinal stenosis     Reproductive/Obstetrics negative OB ROS                              Anesthesia Physical Anesthesia Plan  ASA: 2  Anesthesia Plan: MAC   Post-op Pain Management:    Induction: Intravenous  PONV Risk Score and Plan:   Airway Management Planned: Natural Airway and Nasal Cannula  Additional Equipment:   Intra-op Plan:   Post-operative Plan:   Informed Consent: I have reviewed the patients History and Physical, chart, labs and discussed the procedure including the risks, benefits and alternatives for the proposed anesthesia with the patient or authorized representative who has indicated his/her understanding and  acceptance.     Dental Advisory Given  Plan Discussed with: Anesthesiologist, CRNA and Surgeon  Anesthesia Plan Comments: (Patient consented for risks of anesthesia including but not limited to:  - adverse reactions to medications - damage to eyes, teeth, lips or other oral mucosa - nerve damage due to positioning  - sore throat or hoarseness - Damage to heart, brain, nerves, lungs, other parts of body or loss of life  Patient voiced understanding.)         Anesthesia Quick Evaluation

## 2022-06-27 NOTE — Anesthesia Postprocedure Evaluation (Signed)
Anesthesia Post Note  Patient: Symaria Sindoni [facility]  Procedure(s) Performed: CATARACT EXTRACTION PHACO AND INTRAOCULAR LENS PLACEMENT (IOC) LEFT 12.27 00:54.8 (Left)  Patient location during evaluation: PACU Anesthesia Type: MAC Level of consciousness: awake and alert Pain management: pain level controlled Vital Signs Assessment: post-procedure vital signs reviewed and stable Respiratory status: spontaneous breathing, nonlabored ventilation, respiratory function stable and patient connected to nasal cannula oxygen Cardiovascular status: stable and blood pressure returned to baseline Postop Assessment: no apparent nausea or vomiting Anesthetic complications: no   No notable events documented.   Last Vitals:  Vitals:   06/27/22 0925 06/27/22 0929  BP: 115/82 115/82  Pulse: 61 (!) 57  Resp: 14 14  Temp: (!) 36.2 C (!) 36.2 C  SpO2: 96% 95%    Last Pain:  Vitals:   06/27/22 0929  TempSrc:   PainSc: 0-No pain                 Marisue Humble

## 2022-06-27 NOTE — Transfer of Care (Signed)
Immediate Anesthesia Transfer of Care Note  Patient: Sydney Anderson [facility]  Procedure(s) Performed: CATARACT EXTRACTION PHACO AND INTRAOCULAR LENS PLACEMENT (IOC) LEFT 12.27 00:54.8 (Left)  Patient Location: PACU  Anesthesia Type: MAC  Level of Consciousness: awake, alert  and patient cooperative  Airway and Oxygen Therapy: Patient Spontanous Breathing and Patient connected to supplemental oxygen  Post-op Assessment: Post-op Vital signs reviewed, Patient's Cardiovascular Status Stable, Respiratory Function Stable, Patent Airway and No signs of Nausea or vomiting  Post-op Vital Signs: Reviewed and stable  Complications: No notable events documented.

## 2022-06-28 ENCOUNTER — Encounter: Payer: Self-pay | Admitting: Ophthalmology

## 2022-07-05 NOTE — Progress Notes (Deleted)
Cardiology Office Note Date:  07/05/2022  Patient ID:  Sydney Anderson, Sydney Anderson November 18, 1950, MRN [id number] PCP:  Dale Chase, MD  Cardiologist:  Yvonne Kendall, MD Electrophysiologist: Lanier Prude, MD  ***refresh   Chief Complaint: ***  History of Present Illness: Sydney Anderson is a 72 y.o. female with PMH notable for HTN, hypothyroid, atach, PACs; seen today for Lanier Prude, MD for routine electrophysiology followup.  Last saw Dr. Lalla Brothers 11/2020 for palpitation evaluation. Had worn a zio that showed SVT burden. He started her on propafenone   *** chest pain *** palpitation burden *** how taking meds    Since last being seen in our clinic the patient reports doing ***.  she denies chest pain, palpitations, dyspnea, PND, orthopnea, nausea, vomiting, dizziness, syncope, edema, weight gain, or early satiety.    AAD History: Propafenone, started 10/2020  Past Medical History:  Diagnosis Date   Arthritis    Asthma    Atrial tachycardia    Depression    Diverticulitis    GERD (gastroesophageal reflux disease)    Glaucoma    Hypertension    No issues since quit smoking   Hypothyroidism    Kidney stones    Migraines    None for several years   Myopia of both eyes    Spinal stenosis     Past Surgical History:  Procedure Laterality Date   accident  2005   CATARACT EXTRACTION W/PHACO Left 06/27/2022   Procedure: CATARACT EXTRACTION PHACO AND INTRAOCULAR LENS PLACEMENT (IOC) LEFT 12.27 00:54.8;  Surgeon: Nevada Crane, MD;  Location: [facility];  Service: Ophthalmology;  Laterality: Left;   EYE SURGERY  2010,2011   FOOT SURGERY  2006   kidney stones  1980   SHOULDER ADHESION RELEASE  2008    Current Outpatient Medications  Medication Instructions   FLUoxetine (PROZAC) 10 mg, Oral, Daily   levothyroxine (SYNTHROID) 75 MCG tablet TAKE 1 TABLET(75 MCG) BY MOUTH DAILY   methylcellulose (ARTIFICIAL TEARS) 1 % ophthalmic solution 1  drop, As needed   prednisoLONE acetate (PRED FORTE) 1 % ophthalmic suspension APPLY 1 DROP TO THE RIGHT EYE 4 TIMES A DAY STARTING 2 DAYS BEFORE LASER TREATMENT   propafenone (RYTHMOL) 225 MG tablet TAKE 1 TABLET BY MOUTH EVERY MORNING AND TAKE 1 TABLET BY MOUTH AT BEDTIME. No further refills will be given without an office visit.   sodium chloride (OCEAN) 0.65 % nasal spray 1 spray, As needed   Timolol Maleate 0.5 % (DAILY) SOLN Right Eye, Daily   VOLTAREN 1 % GEL Topical, As needed    Social History:  The patient  reports that she quit smoking about 15 years ago. Her smoking use included cigarettes. She has never used smokeless tobacco. She reports current alcohol use of about 15.0 standard drinks of alcohol per week. She reports that she does not use drugs.   Family History:  *** include only if pertinent The patient's family history includes Arthritis in her mother; Breast cancer (age of onset: 55) in her paternal grandmother; Colon cancer in her father; Heart attack in her brother; Heart disease in her maternal aunt and maternal grandfather; Lung cancer in her mother; Mental illness in her father and mother; Stroke in her maternal aunt and maternal grandfather.***  ROS:  Please see the history of present illness. All other systems are reviewed and otherwise negative.   PHYSICAL EXAM: *** VS:  There were no vitals taken for this visit. BMI: There is  no height or weight on file to calculate BMI.  GEN- The patient is well appearing, alert and oriented x 3 today.   Lungs- Clear to ausculation bilaterally, normal work of breathing.  Heart- {Blank single:19197::"Regular","Irregularly irregular"} rate and rhythm, no murmurs, rubs or gallops Extremities- {EDEMA LEVEL:28147::"No"} peripheral edema, warm, dry   EKG is ordered. Personal review of EKG from today shows:  ***  Recent Labs: 10/15/2021: ALT 58; BUN 11; Creatinine, Ser 0.70; Hemoglobin 14.7; Platelets 257.0; Potassium 4.3; Sodium 138;  TSH 2.20  10/15/2021: Cholesterol 192; HDL 49.80; LDL Cholesterol 118; Total CHOL/HDL Ratio 4; Triglycerides 117.0; VLDL 23.4   CrCl cannot be calculated (Patient's most recent lab result is older than the maximum 21 days allowed.).   Wt Readings from Last 3 Encounters:  06/27/22 204 lb (92.5 kg)  10/15/21 206 lb 12.8 oz (93.8 kg)  05/21/21 202 lb (91.6 kg)     Additional studies reviewed include: Previous EP, cardiology notes.   NM myoview, 11/09/2020   The study is normal. The study is low risk.   No ST deviation was noted.   LV perfusion is normal. There is no evidence of ischemia. There is no evidence of infarction.   Left ventricular function is normal. End diastolic cavity size is normal. End systolic cavity size is normal.   EKG was not interpretable at peak stress due to significant motion artifact but no significant changes noted in recovery.   CT attenuation images showed no significant coronary calcifications.   TTE, 08/26/2020  1. Left ventricular ejection fraction, by estimation, is 65 to 70%. The left ventricle has normal function. The left ventricle has no regional wall motion abnormalities. Left ventricular diastolic parameters were normal.   2. Right ventricular systolic function is normal. The right ventricular size is normal.   3. The mitral valve is normal in structure. No evidence of mitral valve regurgitation.   4. The aortic valve was not well visualized. Aortic valve regurgitation is not visualized.   5. The inferior vena cava is normal in size with greater than 50% respiratory variability, suggesting right atrial pressure of 3 mmHg.    Long term monitor, 08/06/2020 The patient was monitored for 14 days. The predominant rhythm was sinus with an average rate of 66 bpm (range 43-120 bpm and sinus). There were occasional PACs and rare PVCs. 483 atrial runs occurred, lasting up to 17.9 seconds with a maximum rate of 182 bpm. There was no sustained arrhythmia or  prolonged pause. Patient triggered events corresponded to normal sinus rhythm, PACs, and PVCs.  ASSESSMENT AND PLAN:  #) palpitations #) SVT   #) ***    Current medicines are reviewed at length with the patient today.   The patient {ACTIONS; HAS/DOES NOT HAVE:19233} concerns regarding her medicines.  The following changes were made today:  {NONE DEFAULTED:18576}  Labs/ tests ordered today include: *** No orders of the defined types were placed in this encounter.    Disposition: Follow up with {EPMDS:28135} in {EPFOLLOW UP:28173}   Signed, Sherie Don, NP  07/05/22  9:41 AM  Electrophysiology [facility]

## 2022-07-06 ENCOUNTER — Ambulatory Visit: Payer: Medicare Other | Admitting: Cardiology

## 2022-07-06 DIAGNOSIS — I471 Supraventricular tachycardia, unspecified: Secondary | ICD-10-CM

## 2022-07-06 DIAGNOSIS — R002 Palpitations: Secondary | ICD-10-CM

## 2022-07-06 NOTE — Progress Notes (Unsigned)
Cardiology Office Note Date:  07/07/2022  Patient ID:  Sydney Anderson 1951/01/20, MRN [id number] PCP:  Dale Pacific, MD  Cardiologist:  Yvonne Kendall, MD Electrophysiologist: Lanier Prude, MD    Chief Complaint: 1 year atach follow-up, past due  History of Present Illness: Sydney Anderson is a 72 y.o. female with PMH notable for HTN, hypothyroid, atach, PACs; seen today for Lanier Prude, MD for routine electrophysiology followup.  Last saw Dr. Lalla Brothers 11/2020 for palpitation evaluation. Had worn a zio that showed SVT burden. He started her on propafenone.   Today, she tells me that 3 days ago something changed. She had some mild chest discomfort and since then has had increased SOB, fatigue, and palpitations. The symptoms overall are manageable, but she can tell something changed.  She has no further chest pain.   Has diligently been taking propafenone, and until 3 days ago, has felt very well and is happy with the medication.   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     No edema, good appetite.    AAD History: Propafenone, started 10/2020  Past Medical History:  Diagnosis Date   Arthritis    Asthma    Atrial tachycardia    Depression    Diverticulitis    GERD (gastroesophageal reflux disease)    Glaucoma    Hypertension    No issues since quit smoking   Hypothyroidism    Kidney stones    Migraines    None for several years   Myopia of both eyes    Spinal stenosis     Past Surgical History:  Procedure Laterality Date   accident  2005   CATARACT EXTRACTION W/PHACO Left 06/27/2022   Procedure: CATARACT EXTRACTION PHACO AND INTRAOCULAR LENS PLACEMENT (IOC) LEFT 12.27 00:54.8;  Surgeon: Nevada Crane, MD;  Location: [facility];  Service: Ophthalmology;  Laterality: Left;   EYE SURGERY  2010,2011   FOOT SURGERY  2006   kidney stones  1980   SHOULDER ADHESION RELEASE  2008    Current Outpatient  Medications  Medication Instructions   apixaban (ELIQUIS) 5 mg, Oral, 2 times daily   FLUoxetine (PROZAC) 20 mg, Oral, Every other day   levothyroxine (SYNTHROID) 75 MCG tablet TAKE 1 TABLET(75 MCG) BY MOUTH DAILY   methylcellulose (ARTIFICIAL TEARS) 1 % ophthalmic solution 1 drop, As needed   metoprolol succinate (TOPROL XL) 25 mg, Oral, Nightly   prednisoLONE acetate (PRED FORTE) 1 % ophthalmic suspension APPLY 1 DROP TO THE RIGHT EYE 4 TIMES A DAY STARTING 2 DAYS BEFORE LASER TREATMENT   propafenone (RYTHMOL) 225 MG tablet TAKE 1 TABLET BY MOUTH EVERY MORNING AND TAKE 1 TABLET BY MOUTH AT BEDTIME.   sodium chloride (OCEAN) 0.65 % nasal spray 1 spray, As needed   Timolol Maleate 0.5 % (DAILY) SOLN Right Eye, Daily   VOLTAREN 1 % GEL Topical, As needed    Social History:  The patient  reports that she quit smoking about 15 years ago. Her smoking use included cigarettes. She has never used smokeless tobacco. She reports current alcohol use of about 15.0 standard drinks of alcohol per week. She reports that she does not use drugs.   Family History:  The patient's family history includes Arthritis in her mother; Breast cancer (age of onset: 72) in her paternal grandmother; Colon cancer in her father; Heart attack in her brother; Heart disease in her maternal aunt and maternal grandfather;  Lung cancer in her mother; Mental illness in her father and mother; Stroke in her maternal aunt and maternal grandfather.  ROS:  Please see the history of present illness. All other systems are reviewed and otherwise negative.   PHYSICAL EXAM:  VS:  BP 112/75 (BP Location: Left Arm, Patient Position: Sitting, Cuff Size: Large)   Pulse (!) 123   Ht 5\' 7"  (1.702 m)   Wt 202 lb (91.6 kg)   SpO2 98%   BMI 31.64 kg/m  BMI: Body mass index is 31.64 kg/m.  GEN- The patient is well appearing, alert and oriented x 3 today.   Lungs- Clear to ausculation bilaterally, normal work of breathing.  Heart- Irregularly  irregular rate and rhythm, no murmurs, rubs or gallops Extremities- No peripheral edema, warm, dry   EKG is ordered. Personal review of EKG from today shows:  Atypical atrial flutter, rate 101, PRWP  Recent Labs: 10/15/2021: ALT 58; BUN 11; Creatinine, Ser 0.70; Hemoglobin 14.7; Platelets 257.0; Potassium 4.3; Sodium 138; TSH 2.20  10/15/2021: Cholesterol 192; HDL 49.80; LDL Cholesterol 118; Total CHOL/HDL Ratio 4; Triglycerides 117.0; VLDL 23.4   CrCl cannot be calculated (Patient's most recent lab result is older than the maximum 21 days allowed.).   Wt Readings from Last 3 Encounters:  07/07/22 202 lb (91.6 kg)  06/27/22 204 lb (92.5 kg)  10/15/21 206 lb 12.8 oz (93.8 kg)     Additional studies reviewed include: Previous EP, cardiology notes.   NM myoview, 11/09/2020   The study is normal. The study is low risk.   No ST deviation was noted.   LV perfusion is normal. There is no evidence of ischemia. There is no evidence of infarction.   Left ventricular function is normal. End diastolic cavity size is normal. End systolic cavity size is normal.   EKG was not interpretable at peak stress due to significant motion artifact but no significant changes noted in recovery.   CT attenuation images showed no significant coronary calcifications.   TTE, 08/26/2020  1. Left ventricular ejection fraction, by estimation, is 65 to 70%. The left ventricle has normal function. The left ventricle has no regional wall motion abnormalities. Left ventricular diastolic parameters were normal.   2. Right ventricular systolic function is normal. The right ventricular size is normal.   3. The mitral valve is normal in structure. No evidence of mitral valve regurgitation.   4. The aortic valve was not well visualized. Aortic valve regurgitation is not visualized.   5. The inferior vena cava is normal in size with greater than 50% respiratory variability, suggesting right atrial pressure of 3 mmHg.    Long  term monitor, 08/06/2020 The patient was monitored for 14 days. The predominant rhythm was sinus with an average rate of 66 bpm (range 43-120 bpm and sinus). There were occasional PACs and rare PVCs. 483 atrial runs occurred, lasting up to 17.9 seconds with a maximum rate of 182 bpm. There was no sustained arrhythmia or prolonged pause. Patient triggered events corresponded to normal sinus rhythm, PACs, and PVCs.   ASSESSMENT AND PLAN:  #) palpitations #) SVT #) atypical flutter w RVR By symptoms, has been in atrial flutter since at least 3 days ago Ventricular rates up to 120s in office today Non-toxic appearing Will start eliquis 5mg  BID today DCCV in 3 weeks - pre-procedure labs Continue propafenone Start toprol 25mg  nightly - consider updated echo at follow-up  #) Hypercaog d/t Aflutter CHA2DS2-VASc Score = 3 [CHF History: 0,  HTN History: 1, Diabetes History: 0, Stroke History: 0, Vascular Disease History: 0, Age Score: 1, Gender Score: 1].  Therefore, the patient's annual risk of stroke is 3.2 % NOAC - start 5mg  eliquis as above  #) Hypothyroidism Managed by PCP  Will update labwork today Continue synthroid    Current medicines are reviewed at length with the patient today.   The patient does not have concerns regarding her medicines.  The following changes were made today:   START toprol 25mg  daily  Labs/ tests ordered today include:  Orders Placed This Encounter  Procedures   Basic Metabolic Panel (BMET)   Magnesium   TSH   T4, free   CBC   EKG 12-Lead     Disposition: Follow up with EP APP in as usual post procedure  ER precautions discussed. If symptoms worsen, proceed to ER for sooner evaluation  Signed, Sherie Don, NP  07/07/22  3:16 PM  Electrophysiology [facility]    Addendum: After speaking with Dr. Lalla Brothers, recommended for patient to STOP propafenone.  I attempted to call patient, no answer, left detailed msg. I have also  sent her a mychart message with these directions.

## 2022-07-07 ENCOUNTER — Other Ambulatory Visit: Payer: Self-pay | Admitting: Cardiology

## 2022-07-07 ENCOUNTER — Encounter: Payer: Self-pay | Admitting: Internal Medicine

## 2022-07-07 ENCOUNTER — Ambulatory Visit: Payer: Medicare Other | Attending: Cardiology | Admitting: Cardiology

## 2022-07-07 ENCOUNTER — Other Ambulatory Visit
Admission: RE | Admit: 2022-07-07 | Discharge: 2022-07-07 | Disposition: A | Discharge status: Home / Self Care | Payer: Medicare Other | Source: Ambulatory Visit | Attending: Cardiology | Admitting: Cardiology

## 2022-07-07 ENCOUNTER — Encounter: Payer: Self-pay | Admitting: Cardiology

## 2022-07-07 VITALS — BP 112/75 | HR 123 | Ht 67.0 in | Wt 202.0 lb

## 2022-07-07 DIAGNOSIS — R002 Palpitations: Secondary | ICD-10-CM

## 2022-07-07 DIAGNOSIS — R079 Chest pain, unspecified: Secondary | ICD-10-CM

## 2022-07-07 DIAGNOSIS — E039 Hypothyroidism, unspecified: Secondary | ICD-10-CM | POA: Diagnosis not present

## 2022-07-07 DIAGNOSIS — I4892 Unspecified atrial flutter: Secondary | ICD-10-CM | POA: Insufficient documentation

## 2022-07-07 DIAGNOSIS — I471 Supraventricular tachycardia, unspecified: Secondary | ICD-10-CM

## 2022-07-07 LAB — CBC
HCT: 45.7 % (ref 36.0–46.0)
Hemoglobin: 16.1 g/dL — ABNORMAL HIGH (ref 12.0–15.0)
MCH: 31.8 pg (ref 26.0–34.0)
MCHC: 35.2 g/dL (ref 30.0–36.0)
MCV: 90.1 fL (ref 80.0–100.0)
Platelets: 293 10*3/uL (ref 150–400)
RBC: 5.07 MIL/uL (ref 3.87–5.11)
RDW: 12.5 % (ref 11.5–15.5)
WBC: 8.6 10*3/uL (ref 4.0–10.5)
nRBC: 0 % (ref 0.0–0.2)

## 2022-07-07 LAB — BASIC METABOLIC PANEL
Anion gap: 10 (ref 5–15)
BUN: 11 mg/dL (ref 8–23)
CO2: 20 mmol/L — ABNORMAL LOW (ref 22–32)
Calcium: 9 mg/dL (ref 8.9–10.3)
Chloride: 105 mmol/L (ref 98–111)
Creatinine, Ser: 0.77 mg/dL (ref 0.44–1.00)
GFR, Estimated: >60 mL/min (ref >60)
Glucose, Bld: 111 mg/dL — ABNORMAL HIGH (ref 70–99)
Potassium: 4.3 mmol/L (ref 3.5–5.1)
Sodium: 135 mmol/L (ref 135–145)

## 2022-07-07 LAB — T4, FREE: Free T4: 1.08 ng/dL (ref 0.61–1.12)

## 2022-07-07 LAB — MAGNESIUM: Magnesium: 2.2 mg/dL (ref 1.7–2.4)

## 2022-07-07 LAB — TSH: TSH: 4.012 u[IU]/mL (ref 0.350–4.500)

## 2022-07-07 MED ORDER — APIXABAN 5 MG PO TABS: 5.0000 mg | ORAL_TABLET | Freq: Two times a day (BID) | ORAL | 1 refills | Status: DC

## 2022-07-07 MED ORDER — METOPROLOL SUCCINATE ER 25 MG PO TB24: 25.0000 mg | ORAL_TABLET | Freq: Every evening | ORAL | 1 refills | Status: DC

## 2022-07-07 NOTE — Patient Instructions (Signed)
Medication Instructions:  START Eliquis 5 mg by mouth twice a day START Toprol XL 25 mg by mouth at night  *If you need a refill on your cardiac medications before your next appointment, please call your pharmacy*   Lab Work: BMP, CBC, Magnesium level, TSH/free T4 to be drawn today - Please go to the [facility]. You will check in at the front desk to the right as you walk into the atrium. Valet Parking is offered if needed. - No appointment needed. You may go any day between 7 am and 6 pm.  If you have labs (blood work) drawn today and your tests are completely normal, you will receive your results only by: MyChart Message (if you have MyChart) OR A paper copy in the mail If you have any lab test that is abnormal or we need to change your treatment, we will call you to review the results.   Testing/Procedures: You are scheduled for a Cardioversion on August 02, 2022 with Dr. Okey Dupre.    Please arrive at the [facility] Entrance of [facility], [address], [place] [address] at 6:30 am (This is 1 hour prior to your procedure time).  Proceed to the Check-In Desk directly inside the entrance.  Procedure Parking: Use the entrance off of the [address] side of the hospital. Turn right upon entering and follow the driveway to parking that is directly in front of the Heart [facility] Vascular Center. There is no valet parking available at this entrance, however there is an awning directly in front of the Heart & Vascular Center for drop off/ pick up for patients  DIET: Nothing to eat or drink after midnight except a sip of water with medications (see medication instructions below)  Medication Instructions: Continue your anticoagulant: Eliquis If you miss a dose, please call us at [phone] You will need to continue your anticoagulant after your procedure until you are told by your provider that it is safe to stop.   FYI: For your safety, and to allow [place] to monitor your vital  signs accurately during the surgery/procedure we request that if you have artificial nails, gel coating, SNS etc. Please have those removed prior to your surgery/procedure. Not having the nail coverings /polish removed may result in cancellation or delay of your surgery/procedure.  You must have a responsible person to drive you home and stay in the waiting area during your procedure. Failure to do so could result in cancellation.  Bring your insurance cards.  If you have any questions after you get home, please call the office at [phone]  *Special Note: Every effort is made to have your procedure done on time. Occasionally there are emergencies that occur at the hospital that may cause delays. Please be patient if a delay does occur.        Follow-Up: At [facility], you and your health needs are our priority.  As part of our continuing mission to provide you with exceptional heart care, we have created designated Provider Care Teams.  These Care Teams include your primary Cardiologist (physician) and Advanced Practice Providers (APPs -  Physician Assistants and Nurse Practitioners) who all work together to provide you with the care you need, when you need it.  We recommend signing up for the patient portal called "MyChart".  Sign up information is provided on this After Visit Summary.  MyChart is used to connect with patients for Virtual Visits (Telemedicine).  Patients are able  to view lab/test results, encounter notes, upcoming appointments, etc.  Non-urgent messages can be sent to your provider as well.   To learn more about what you can do with MyChart, go to [email or web address].    Your next appointment:   2 week(s) after cardioversion  Provider:   Sherie Don, NP

## 2022-07-07 NOTE — Telephone Encounter (Signed)
Good Morning,  This patient has an appointment with Suzann today at 1:30pm. Could you refill at the time of appointment if appropriate? Thank you so much.

## 2022-07-08 NOTE — Telephone Encounter (Signed)
LMOVM for pt to contact office to discuss medication.

## 2022-07-08 NOTE — Telephone Encounter (Signed)
Lmovm for pt to contact our office.

## 2022-07-08 NOTE — Telephone Encounter (Signed)
I contacted pt's emergency contact Husband per DPR no answer.

## 2022-07-08 NOTE — Telephone Encounter (Signed)
Lmovm to inform patient to contact our office. I was able to leave a detailed message to pt to make pt aware that she is to d/c her propafenone at this time per Dr. Lalla Brothers and Sherie Don, NP.

## 2022-07-11 NOTE — Telephone Encounter (Signed)
Patient sent a message via MyChart stating June 25th will not work with her schedule, please reschedule.  I called patient and left a voicemail asking her to please call us to reschedule her appointment with Dr. Dale Jeannette.

## 2022-07-12 ENCOUNTER — Other Ambulatory Visit: Payer: Self-pay | Admitting: *Deleted

## 2022-07-12 NOTE — Telephone Encounter (Signed)
Per chart- appt moved to 6/27

## 2022-07-14 NOTE — Progress Notes (Deleted)
Cardiology Office Note Date:  07/14/2022  Patient ID:  Sydney Anderson, Sydney Anderson 1950-02-12, MRN [id number] PCP:  Dale Alto, MD  Cardiologist:  Yvonne Kendall, MD Electrophysiologist: Lanier Prude, MD  ***refresh   Chief Complaint: ***  History of Present Illness: Sydney Anderson is a 72 y.o. female with PMH notable for HTN, hypothyroid, atach, PACs; seen today for Lanier Prude, MD for routine electrophysiology followup.  Last saw Dr. Lalla Brothers 11/2020 for palpitation evaluation. Had worn a zio that showed SVT burden. He started her on propafenone. I saw her last week, and she was found to be in a flutter with RVR, having chest discomfort, SOB, increased fatigue..  Her propafenone was stopped, Toprol and Eliquis started.  She was set up for a cardioversion 3 weeks later.  She called the office a couple days ago stating that her pulse was low and questioning what she should do.  She is not able to obtain an EKG at home.     *** chest pain *** palpitation burden *** how taking meds    Since last being seen in our clinic the patient reports doing ***.  she denies chest pain, palpitations, dyspnea, PND, orthopnea, nausea, vomiting, dizziness, syncope, edema, weight gain, or early satiety.    AAD History: Propafenone, started 10/2020  Past Medical History:  Diagnosis Date   Arthritis    Asthma    Atrial tachycardia    Depression    Diverticulitis    GERD (gastroesophageal reflux disease)    Glaucoma    Hypertension    No issues since quit smoking   Hypothyroidism    Kidney stones    Migraines    None for several years   Myopia of both eyes    Spinal stenosis     Past Surgical History:  Procedure Laterality Date   accident  2005   CATARACT EXTRACTION W/PHACO Left 06/27/2022   Procedure: CATARACT EXTRACTION PHACO AND INTRAOCULAR LENS PLACEMENT (IOC) LEFT 12.27 00:54.8;  Surgeon: Nevada Crane, MD;  Location: [facility];  Service:  Ophthalmology;  Laterality: Left;   EYE SURGERY  2010,2011   FOOT SURGERY  2006   kidney stones  1980   SHOULDER ADHESION RELEASE  2008    Current Outpatient Medications  Medication Instructions   apixaban (ELIQUIS) 5 mg, Oral, 2 times daily   FLUoxetine (PROZAC) 20 mg, Oral, Every other day   levothyroxine (SYNTHROID) 75 MCG tablet TAKE 1 TABLET(75 MCG) BY MOUTH DAILY   methylcellulose (ARTIFICIAL TEARS) 1 % ophthalmic solution 1 drop, As needed   prednisoLONE acetate (PRED FORTE) 1 % ophthalmic suspension APPLY 1 DROP TO THE RIGHT EYE 4 TIMES A DAY STARTING 2 DAYS BEFORE LASER TREATMENT   propafenone (RYTHMOL) 225 MG tablet TAKE 1 TABLET BY MOUTH EVERY MORNING AND TAKE 1 TABLET BY MOUTH AT BEDTIME.   sodium chloride (OCEAN) 0.65 % nasal spray 1 spray, As needed   Timolol Maleate 0.5 % (DAILY) SOLN Right Eye, Daily   VOLTAREN 1 % GEL Topical, As needed    Social History:  The patient  reports that she quit smoking about 15 years ago. Her smoking use included cigarettes. She has never used smokeless tobacco. She reports current alcohol use of about 15.0 standard drinks of alcohol per week. She reports that she does not use drugs.   Family History:  *** include only if pertinent The patient's family history includes Arthritis in her mother; Breast cancer (age of onset: 101) in  her paternal grandmother; Colon cancer in her father; Heart attack in her brother; Heart disease in her maternal aunt and maternal grandfather; Lung cancer in her mother; Mental illness in her father and mother; Stroke in her maternal aunt and maternal grandfather.***  ROS:  Please see the history of present illness. All other systems are reviewed and otherwise negative.   PHYSICAL EXAM: *** VS:  There were no vitals taken for this visit. BMI: There is no height or weight on file to calculate BMI.  GEN- The patient is well appearing, alert and oriented x 3 today.   Lungs- Clear to ausculation bilaterally, normal  work of breathing.  Heart- {Blank single:19197::"Regular","Irregularly irregular"} rate and rhythm, no murmurs, rubs or gallops Extremities- {EDEMA LEVEL:28147::"No"} peripheral edema, warm, dry   EKG is ordered. Personal review of EKG from today shows:  ***  Recent Labs: 10/15/2021: ALT 58 07/07/2022: BUN 11; Creatinine, Ser 0.77; Hemoglobin 16.1; Magnesium 2.2; Platelets 293; Potassium 4.3; Sodium 135; TSH 4.012  10/15/2021: Cholesterol 192; HDL 49.80; LDL Cholesterol 118; Total CHOL/HDL Ratio 4; Triglycerides 117.0; VLDL 23.4   Estimated Creatinine Clearance: 76 mL/min (by C-G formula based on SCr of 0.77 mg/dL).   Wt Readings from Last 3 Encounters:  07/07/22 202 lb (91.6 kg)  06/27/22 204 lb (92.5 kg)  10/15/21 206 lb 12.8 oz (93.8 kg)     Additional studies reviewed include: Previous EP, cardiology notes.   NM myoview, 11/09/2020   The study is normal. The study is low risk.   No ST deviation was noted.   LV perfusion is normal. There is no evidence of ischemia. There is no evidence of infarction.   Left ventricular function is normal. End diastolic cavity size is normal. End systolic cavity size is normal.   EKG was not interpretable at peak stress due to significant motion artifact but no significant changes noted in recovery.   CT attenuation images showed no significant coronary calcifications.   TTE, 08/26/2020  1. Left ventricular ejection fraction, by estimation, is 65 to 70%. The left ventricle has normal function. The left ventricle has no regional wall motion abnormalities. Left ventricular diastolic parameters were normal.   2. Right ventricular systolic function is normal. The right ventricular size is normal.   3. The mitral valve is normal in structure. No evidence of mitral valve regurgitation.   4. The aortic valve was not well visualized. Aortic valve regurgitation is not visualized.   5. The inferior vena cava is normal in size with greater than 50% respiratory  variability, suggesting right atrial pressure of 3 mmHg.    Long term monitor, 08/06/2020 The patient was monitored for 14 days. The predominant rhythm was sinus with an average rate of 66 bpm (range 43-120 bpm and sinus). There were occasional PACs and rare PVCs. 483 atrial runs occurred, lasting up to 17.9 seconds with a maximum rate of 182 bpm. There was no sustained arrhythmia or prolonged pause. Patient triggered events corresponded to normal sinus rhythm, PACs, and PVCs.  ASSESSMENT AND PLAN:  #) palpitations #) SVT #) parox AFlutter   #) Hypercoag d/t Aflutter  CHA2DS2-VASc Score = 3 [CHF History: 0, HTN History: 1, Diabetes History: 0, Stroke History: 0, Vascular Disease History: 0, Age Score: 1, Gender Score: 1].  Therefore, the patient's annual risk of stroke is 3.2 %.  NOAC - 5 mg Eliquis twice daily, appropriately dosed No bleeding concerns  {Confirm score is correct.  If not, click here to update score.  REFRESH  note.  :1}      Current medicines are reviewed at length with the patient today.   The patient {ACTIONS; HAS/DOES NOT HAVE:19233} concerns regarding her medicines.  The following changes were made today:  {NONE DEFAULTED:18576}  Labs/ tests ordered today include: *** No orders of the defined types were placed in this encounter.    Disposition: Follow up with {EPMDS:28135} in {EPFOLLOW UP:28173}   Signed, Sherie Don, NP  07/14/22  11:51 AM  Electrophysiology [facility]

## 2022-07-15 ENCOUNTER — Ambulatory Visit: Payer: Medicare Other | Admitting: Cardiology

## 2022-07-15 DIAGNOSIS — I484 Atypical atrial flutter: Secondary | ICD-10-CM

## 2022-07-15 DIAGNOSIS — D6869 Other thrombophilia: Secondary | ICD-10-CM

## 2022-07-15 DIAGNOSIS — I471 Supraventricular tachycardia, unspecified: Secondary | ICD-10-CM

## 2022-07-15 NOTE — Progress Notes (Unsigned)
Cardiology Office Note Date:  07/15/2022  Patient ID:  Sydney, Anderson 1950/12/16, MRN [id number] PCP:  Dale Harper, MD  Cardiologist:  Yvonne Kendall, MD Electrophysiologist: Lanier Prude, MD  ***refresh   Chief Complaint: ***  History of Present Illness: Sydney Anderson is a 72 y.o. female with PMH notable for Aflutter, HTN, hypothyroid, atach, PACs; seen today for Lanier Prude, MD for routine electrophysiology followup.  Last saw Dr. Lalla Brothers 11/2020 for palpitation evaluation. Had worn a zio that showed SVT burden. He started her on propafenone. I saw her last week, and she was found to be in aflutter with RVR, having chest discomfort, SOB, increased fatigue..  Her propafenone was stopped, Toprol and Eliquis started.  She was set up for a cardioversion 3 weeks later.  She called the office a couple days ago stating that her pulse was low and questioning what she should do.  She is not able to obtain an EKG at home.     *** chest pain *** palpitation burden *** how taking meds    Since last being seen in our clinic the patient reports doing ***.  she denies chest pain, palpitations, dyspnea, PND, orthopnea, nausea, vomiting, dizziness, syncope, edema, weight gain, or early satiety.    AAD History: Propafenone, started 10/2020  Past Medical History:  Diagnosis Date   Arthritis    Asthma    Atrial tachycardia    Depression    Diverticulitis    GERD (gastroesophageal reflux disease)    Glaucoma    Hypertension    No issues since quit smoking   Hypothyroidism    Kidney stones    Migraines    None for several years   Myopia of both eyes    Spinal stenosis     Past Surgical History:  Procedure Laterality Date   accident  2005   CATARACT EXTRACTION W/PHACO Left 06/27/2022   Procedure: CATARACT EXTRACTION PHACO AND INTRAOCULAR LENS PLACEMENT (IOC) LEFT 12.27 00:54.8;  Surgeon: Nevada Crane, MD;  Location: [facility];  Service:  Ophthalmology;  Laterality: Left;   EYE SURGERY  2010,2011   FOOT SURGERY  2006   kidney stones  1980   SHOULDER ADHESION RELEASE  2008    Current Outpatient Medications  Medication Instructions   apixaban (ELIQUIS) 5 mg, Oral, 2 times daily   FLUoxetine (PROZAC) 20 mg, Oral, Every other day   levothyroxine (SYNTHROID) 75 MCG tablet TAKE 1 TABLET(75 MCG) BY MOUTH DAILY   methylcellulose (ARTIFICIAL TEARS) 1 % ophthalmic solution 1 drop, As needed   prednisoLONE acetate (PRED FORTE) 1 % ophthalmic suspension APPLY 1 DROP TO THE RIGHT EYE 4 TIMES A DAY STARTING 2 DAYS BEFORE LASER TREATMENT   propafenone (RYTHMOL) 225 MG tablet TAKE 1 TABLET BY MOUTH EVERY MORNING AND TAKE 1 TABLET BY MOUTH AT BEDTIME.   sodium chloride (OCEAN) 0.65 % nasal spray 1 spray, As needed   Timolol Maleate 0.5 % (DAILY) SOLN Right Eye, Daily   VOLTAREN 1 % GEL Topical, As needed    Social History:  The patient  reports that she quit smoking about 15 years ago. Her smoking use included cigarettes. She has never used smokeless tobacco. She reports current alcohol use of about 15.0 standard drinks of alcohol per week. She reports that she does not use drugs.   Family History:  *** include only if pertinent The patient's family history includes Arthritis in her mother; Breast cancer (age of onset: 71) in  her paternal grandmother; Colon cancer in her father; Heart attack in her brother; Heart disease in her maternal aunt and maternal grandfather; Lung cancer in her mother; Mental illness in her father and mother; Stroke in her maternal aunt and maternal grandfather.***  ROS:  Please see the history of present illness. All other systems are reviewed and otherwise negative.   PHYSICAL EXAM: *** VS:  There were no vitals taken for this visit. BMI: There is no height or weight on file to calculate BMI.  GEN- The patient is well appearing, alert and oriented x 3 today.   Lungs- Clear to ausculation bilaterally, normal  work of breathing.  Heart- {Blank single:19197::"Regular","Irregularly irregular"} rate and rhythm, no murmurs, rubs or gallops Extremities- {EDEMA LEVEL:28147::"No"} peripheral edema, warm, dry   EKG is ordered. Personal review of EKG from today shows:  ***  Recent Labs: 10/15/2021: ALT 58 07/07/2022: BUN 11; Creatinine, Ser 0.77; Hemoglobin 16.1; Magnesium 2.2; Platelets 293; Potassium 4.3; Sodium 135; TSH 4.012  10/15/2021: Cholesterol 192; HDL 49.80; LDL Cholesterol 118; Total CHOL/HDL Ratio 4; Triglycerides 117.0; VLDL 23.4   Estimated Creatinine Clearance: 76 mL/min (by C-G formula based on SCr of 0.77 mg/dL).   Wt Readings from Last 3 Encounters:  07/07/22 202 lb (91.6 kg)  06/27/22 204 lb (92.5 kg)  10/15/21 206 lb 12.8 oz (93.8 kg)     Additional studies reviewed include: Previous EP, cardiology notes.   NM myoview, 11/09/2020   The study is normal. The study is low risk.   No ST deviation was noted.   LV perfusion is normal. There is no evidence of ischemia. There is no evidence of infarction.   Left ventricular function is normal. End diastolic cavity size is normal. End systolic cavity size is normal.   EKG was not interpretable at peak stress due to significant motion artifact but no significant changes noted in recovery.   CT attenuation images showed no significant coronary calcifications.   TTE, 08/26/2020  1. Left ventricular ejection fraction, by estimation, is 65 to 70%. The left ventricle has normal function. The left ventricle has no regional wall motion abnormalities. Left ventricular diastolic parameters were normal.   2. Right ventricular systolic function is normal. The right ventricular size is normal.   3. The mitral valve is normal in structure. No evidence of mitral valve regurgitation.   4. The aortic valve was not well visualized. Aortic valve regurgitation is not visualized.   5. The inferior vena cava is normal in size with greater than 50% respiratory  variability, suggesting right atrial pressure of 3 mmHg.    Long term monitor, 08/06/2020 The patient was monitored for 14 days. The predominant rhythm was sinus with an average rate of 66 bpm (range 43-120 bpm and sinus). There were occasional PACs and rare PVCs. 483 atrial runs occurred, lasting up to 17.9 seconds with a maximum rate of 182 bpm. There was no sustained arrhythmia or prolonged pause. Patient triggered events corresponded to normal sinus rhythm, PACs, and PVCs.  ASSESSMENT AND PLAN:  #) palpitations #) SVT #) parox AFlutter   #) Hypercoag d/t Aflutter  CHA2DS2-VASc Score = 3 [CHF History: 0, HTN History: 1, Diabetes History: 0, Stroke History: 0, Vascular Disease History: 0, Age Score: 1, Gender Score: 1].  Therefore, the patient's annual risk of stroke is 3.2 %.  NOAC - 5 mg Eliquis twice daily, appropriately dosed No bleeding concerns  {Confirm score is correct.  If not, click here to update score.  REFRESH  note.  :1}      Current medicines are reviewed at length with the patient today.   The patient {ACTIONS; HAS/DOES NOT HAVE:19233} concerns regarding her medicines.  The following changes were made today:  {NONE DEFAULTED:18576}  Labs/ tests ordered today include: *** No orders of the defined types were placed in this encounter.    Disposition: Follow up with {EPMDS:28135} in {EPFOLLOW UP:28173}   Signed, Sherie Don, NP  07/15/22  8:25 AM  Electrophysiology [facility]

## 2022-07-18 ENCOUNTER — Ambulatory Visit: Payer: Medicare Other | Attending: Cardiology | Admitting: Cardiology

## 2022-07-18 ENCOUNTER — Encounter: Payer: Self-pay | Admitting: Cardiology

## 2022-07-18 VITALS — BP 128/90 | HR 59 | Ht 67.0 in | Wt 202.0 lb

## 2022-07-18 DIAGNOSIS — R002 Palpitations: Secondary | ICD-10-CM

## 2022-07-18 DIAGNOSIS — D6869 Other thrombophilia: Secondary | ICD-10-CM

## 2022-07-18 DIAGNOSIS — I4891 Unspecified atrial fibrillation: Secondary | ICD-10-CM

## 2022-07-18 DIAGNOSIS — I483 Typical atrial flutter: Secondary | ICD-10-CM

## 2022-07-18 MED ORDER — PROPAFENONE HCL ER 225 MG PO CP12: 225.0000 mg | ORAL_CAPSULE | Freq: Two times a day (BID) | ORAL | 3 refills | Status: DC

## 2022-07-18 NOTE — Patient Instructions (Addendum)
Medication Instructions:  Discontinue Toprol Restart propafenone 225 mg by mouth twice a day  *If you need a refill on your cardiac medications before your next appointment, please call your pharmacy*   Lab Work: No labs ordered  If you have labs (blood work) drawn today and your tests are completely normal, you will receive your results only by: MyChart Message (if you have MyChart) OR A paper copy in the mail If you have any lab test that is abnormal or we need to change your treatment, we will call you to review the results.   Testing/Procedures: Your physician has requested that you have an echocardiogram. Echocardiography is a painless test that uses sound waves to create images of your heart. It provides your doctor with information about the size and shape of your heart and how well your heart's chambers and valves are working. This procedure takes approximately one hour. There are no restrictions for this procedure. Please do NOT wear cologne, perfume, aftershave, or lotions (deodorant is allowed). Please arrive 15 minutes prior to your appointment time.  Follow-Up: At [facility], you and your health needs are our priority.  As part of our continuing mission to provide you with exceptional heart care, we have created designated Provider Care Teams.  These Care Teams include your primary Cardiologist (physician) and Advanced Practice Providers (APPs -  Physician Assistants and Nurse Practitioners) who all work together to provide you with the care you need, when you need it.  We recommend signing up for the patient portal called "MyChart".  Sign up information is provided on this After Visit Summary.  MyChart is used to connect with patients for Virtual Visits (Telemedicine).  Patients are able to view lab/test results, encounter notes, upcoming appointments, etc.  Non-urgent messages can be sent to your provider as well.   To learn more about what you can do with MyChart,  go to [email or web address].    Your next appointment:   3 month(s)  Provider:   Sherie Don, NP   Other Instructions Your cardioversion on 08/02/22 has been cancelled.

## 2022-07-25 ENCOUNTER — Ambulatory Visit: Payer: Medicare Other

## 2022-07-26 DIAGNOSIS — Z961 Presence of intraocular lens: Secondary | ICD-10-CM | POA: Diagnosis not present

## 2022-07-28 ENCOUNTER — Encounter: Payer: Medicare Other | Admitting: Internal Medicine

## 2022-07-29 ENCOUNTER — Ambulatory Visit: Payer: Medicare Other | Attending: Cardiology

## 2022-07-29 DIAGNOSIS — I4891 Unspecified atrial fibrillation: Secondary | ICD-10-CM

## 2022-07-30 LAB — ECHOCARDIOGRAM COMPLETE
Area-P 1/2: 3.08 cm2
S' Lateral: 2.9 cm

## 2022-08-02 ENCOUNTER — Ambulatory Visit: Admit: 2022-08-02 | Payer: Medicare Other | Admitting: Internal Medicine

## 2022-08-02 DIAGNOSIS — I484 Atypical atrial flutter: Secondary | ICD-10-CM

## 2022-08-02 SURGERY — CARDIOVERSION
Anesthesia: General

## 2022-08-04 ENCOUNTER — Encounter: Payer: Self-pay | Admitting: Internal Medicine

## 2022-08-04 ENCOUNTER — Ambulatory Visit (INDEPENDENT_AMBULATORY_CARE_PROVIDER_SITE_OTHER): Payer: Medicare Other | Admitting: Internal Medicine

## 2022-08-04 VITALS — BP 128/74 | HR 68 | Temp 97.9°F | Resp 16 | Ht 67.0 in | Wt 203.4 lb

## 2022-08-04 DIAGNOSIS — D509 Iron deficiency anemia, unspecified: Secondary | ICD-10-CM

## 2022-08-04 DIAGNOSIS — Z1211 Encounter for screening for malignant neoplasm of colon: Secondary | ICD-10-CM

## 2022-08-04 DIAGNOSIS — Z1231 Encounter for screening mammogram for malignant neoplasm of breast: Secondary | ICD-10-CM | POA: Diagnosis not present

## 2022-08-04 DIAGNOSIS — E7800 Pure hypercholesterolemia, unspecified: Secondary | ICD-10-CM

## 2022-08-04 DIAGNOSIS — E039 Hypothyroidism, unspecified: Secondary | ICD-10-CM | POA: Diagnosis not present

## 2022-08-04 DIAGNOSIS — Z0000 Encounter for general adult medical examination without abnormal findings: Secondary | ICD-10-CM | POA: Diagnosis not present

## 2022-08-04 DIAGNOSIS — F321 Major depressive disorder, single episode, moderate: Secondary | ICD-10-CM

## 2022-08-04 DIAGNOSIS — K219 Gastro-esophageal reflux disease without esophagitis: Secondary | ICD-10-CM | POA: Diagnosis not present

## 2022-08-04 DIAGNOSIS — M810 Age-related osteoporosis without current pathological fracture: Secondary | ICD-10-CM

## 2022-08-04 DIAGNOSIS — I483 Typical atrial flutter: Secondary | ICD-10-CM | POA: Diagnosis not present

## 2022-08-04 LAB — CBC WITH DIFFERENTIAL/PLATELET
Basophils Absolute: 0.1 10*3/uL (ref 0.0–0.1)
Basophils Relative: 0.9 % (ref 0.0–3.0)
Eosinophils Absolute: 0.2 10*3/uL (ref 0.0–0.7)
Eosinophils Relative: 2.9 % (ref 0.0–5.0)
HCT: 45.9 % (ref 36.0–46.0)
Hemoglobin: 15 g/dL (ref 12.0–15.0)
Lymphocytes Relative: 37.5 % (ref 12.0–46.0)
Lymphs Abs: 3 10*3/uL (ref 0.7–4.0)
MCHC: 32.7 g/dL (ref 30.0–36.0)
MCV: 94.9 fl (ref 78.0–100.0)
Monocytes Absolute: 0.4 10*3/uL (ref 0.1–1.0)
Monocytes Relative: 5.4 % (ref 3.0–12.0)
Neutro Abs: 4.2 10*3/uL (ref 1.4–7.7)
Neutrophils Relative %: 53.3 % (ref 43.0–77.0)
Platelets: 253 10*3/uL (ref 150.0–400.0)
RBC: 4.84 Mil/uL (ref 3.87–5.11)
RDW: 13.3 % (ref 11.5–15.5)
WBC: 7.9 10*3/uL (ref 4.0–10.5)

## 2022-08-04 LAB — HEPATIC FUNCTION PANEL
ALT: 66 U/L — ABNORMAL HIGH (ref 0–35)
AST: 63 U/L — ABNORMAL HIGH (ref 0–37)
Albumin: 4.2 g/dL (ref 3.5–5.2)
Alkaline Phosphatase: 140 U/L — ABNORMAL HIGH (ref 39–117)
Bilirubin, Direct: 0.1 mg/dL (ref 0.0–0.3)
Total Bilirubin: 0.6 mg/dL (ref 0.2–1.2)
Total Protein: 6.8 g/dL (ref 6.0–8.3)

## 2022-08-04 LAB — BASIC METABOLIC PANEL
BUN: 9 mg/dL (ref 6–23)
CO2: 26 mEq/L (ref 19–32)
Calcium: 9.3 mg/dL (ref 8.4–10.5)
Chloride: 102 mEq/L (ref 96–112)
Creatinine, Ser: 0.83 mg/dL (ref 0.40–1.20)
GFR: 71.46 mL/min (ref >60.00)
Glucose, Bld: 86 mg/dL (ref 70–99)
Potassium: 3.9 mEq/L (ref 3.5–5.1)
Sodium: 137 mEq/L (ref 135–145)

## 2022-08-04 LAB — LIPID PANEL
Cholesterol: 185 mg/dL (ref 0–200)
HDL: 45.3 mg/dL (ref >39.00)
LDL Cholesterol: 111 mg/dL — ABNORMAL HIGH (ref 0–99)
NonHDL: 139.65
Total CHOL/HDL Ratio: 4
Triglycerides: 142 mg/dL (ref 0.0–149.0)
VLDL: 28.4 mg/dL (ref 0.0–40.0)

## 2022-08-04 LAB — VITAMIN D 25 HYDROXY (VIT D DEFICIENCY, FRACTURES): VITD: 9.2 ng/mL — ABNORMAL LOW (ref 30.00–100.00)

## 2022-08-04 NOTE — Assessment & Plan Note (Addendum)
Physical today 08/04/22.  Mammogram 06/09/2020 BI-RADS 1.  Overdue. Scheduled. Colonoscopy July 2015.  Overdue. Agreeable for referral to GI.

## 2022-08-04 NOTE — Progress Notes (Signed)
Subjective:    Patient ID: Sydney Anderson, female    DOB: 1951/01/19, 72 y.o.   MRN: [id number]  Patient here for  Chief Complaint  Patient presents with   Annual Exam    HPI Here for a physical exam. Saw cardiology follow up - 07/07/22 - aflutter - started on eliquis.  Recommended to stop propafenone and start toprol. Had f/u 07/18/22 - had self converted to sinus bradycardia.  Recommended to restart propafenone and stop toprol.  Reports has not had any further episodes since her last cardiology visit.  No chest pain or sob reported.  No cough or congestion.  No abdominal pain or bowel change reported.   Agreeable to schedule mammogram.  Discussed colonoscopy.     Past Medical History:  Diagnosis Date   Arthritis    Asthma    Atrial tachycardia    Depression    Diverticulitis    GERD (gastroesophageal reflux disease)    Glaucoma    Hypertension    No issues since quit smoking   Hypothyroidism    Kidney stones    Migraines    None for several years   Myopia of both eyes    Spinal stenosis    Past Surgical History:  Procedure Laterality Date   accident  2005   CATARACT EXTRACTION W/PHACO Left 06/27/2022   Procedure: CATARACT EXTRACTION PHACO AND INTRAOCULAR LENS PLACEMENT (IOC) LEFT 12.27 00:54.8;  Surgeon: Nevada Crane, MD;  Location: [facility];  Service: Ophthalmology;  Laterality: Left;   EYE SURGERY  2010,2011   FOOT SURGERY  2006   kidney stones  1980   SHOULDER ADHESION RELEASE  2008   Family History  Problem Relation Age of Onset   Arthritis Mother    Lung cancer Mother    Mental illness Mother    Colon cancer Father    Mental illness Father    Heart attack Brother    Stroke Maternal Aunt    Heart disease Maternal Aunt    Heart disease Maternal Grandfather        Pacemaker   Stroke Maternal Grandfather    Breast cancer Paternal Grandmother 72   Social History   Socioeconomic History   Marital status: Married    Spouse name: Not  on file   Number of children: Not on file   Years of education: Not on file   Highest education level: Not on file  Occupational History   Not on file  Tobacco Use   Smoking status: Former    Types: Cigarettes    Quit date: 2009    Years since quitting: 15.5   Smokeless tobacco: Never  Vaping Use   Vaping Use: Never used  Substance and Sexual Activity   Alcohol use: Yes    Alcohol/week: 15.0 standard drinks of alcohol    Types: 15 Standard drinks or equivalent per week    Comment: hard cider   Drug use: No   Sexual activity: Not on file  Other Topics Concern   Not on file  Social History Narrative   Not on file   Social Determinants of Health   Financial Resource Strain: Not on file  Food Insecurity: Not on file  Transportation Needs: Not on file  Physical Activity: Not on file  Stress: Not on file  Social Connections: Not on file     Review of Systems  Constitutional:  Negative for appetite change and unexpected weight change.  HENT:  Negative for congestion, sinus  pressure and sore throat.   Eyes:  Negative for pain and visual disturbance.  Respiratory:  Negative for cough, chest tightness and shortness of breath.   Cardiovascular:  Negative for chest pain, palpitations and leg swelling.  Gastrointestinal:  Negative for abdominal pain, diarrhea, nausea and vomiting.  Genitourinary:  Negative for difficulty urinating and dysuria.  Musculoskeletal:  Negative for joint swelling and myalgias.  Skin:  Negative for color change and rash.  Neurological:  Negative for dizziness and headaches.  Hematological:  Negative for adenopathy. Does not bruise/bleed easily.  Psychiatric/Behavioral:  Negative for agitation and dysphoric mood.        Objective:     BP 128/74   Pulse 68   Temp 97.9 F (36.6 C)   Resp 16   Ht 5\' 7"  (1.702 m)   Wt 203 lb 6.4 oz (92.3 kg)   SpO2 97%   BMI 31.86 kg/m  Wt Readings from Last 3 Encounters:  08/04/22 203 lb 6.4 oz (92.3 kg)   07/18/22 202 lb (91.6 kg)  07/07/22 202 lb (91.6 kg)    Physical Exam Vitals reviewed.  Constitutional:      General: She is not in acute distress.    Appearance: Normal appearance. She is well-developed.  HENT:     Head: Normocephalic and atraumatic.     Right Ear: External ear normal.     Left Ear: External ear normal.  Eyes:     General: No scleral icterus.       Right eye: No discharge.        Left eye: No discharge.     Conjunctiva/sclera: Conjunctivae normal.  Neck:     Thyroid: No thyromegaly.  Cardiovascular:     Rate and Rhythm: Normal rate and regular rhythm.  Pulmonary:     Effort: No tachypnea, accessory muscle usage or respiratory distress.     Breath sounds: Normal breath sounds. No decreased breath sounds or wheezing.  Chest:  Breasts:    Right: No inverted nipple, mass, nipple discharge or tenderness (no axillary adenopathy).     Left: No inverted nipple, mass, nipple discharge or tenderness (no axilarry adenopathy).  Abdominal:     General: Bowel sounds are normal.     Palpations: Abdomen is soft.     Tenderness: There is no abdominal tenderness.  Musculoskeletal:        General: No swelling or tenderness.     Cervical back: Neck supple.  Lymphadenopathy:     Cervical: No cervical adenopathy.  Skin:    Findings: No erythema or rash.  Neurological:     Mental Status: She is alert and oriented to person, place, and time.  Psychiatric:        Mood and Affect: Mood normal.        Behavior: Behavior normal.      Outpatient Encounter Medications as of 08/04/2022  Medication Sig   apixaban (ELIQUIS) 5 MG TABS tablet Take 1 tablet (5 mg total) by mouth 2 (two) times daily.   FLUoxetine (PROZAC) 20 MG capsule Take 20 mg by mouth every other day.   levothyroxine (SYNTHROID) 75 MCG tablet TAKE 1 TABLET(75 MCG) BY MOUTH DAILY   methylcellulose (ARTIFICIAL TEARS) 1 % ophthalmic solution Place 1 drop into both eyes as needed.   prednisoLONE acetate (PRED  FORTE) 1 % ophthalmic suspension APPLY 1 DROP TO THE RIGHT EYE 4 TIMES A DAY STARTING 2 DAYS BEFORE LASER TREATMENT   propafenone (RYTHMOL SR) 225 MG 12 hr capsule  Take 1 capsule (225 mg total) by mouth 2 (two) times daily.   sodium chloride (OCEAN) 0.65 % nasal spray Place 1 spray into the nose as needed.   Timolol Maleate 0.5 % (DAILY) SOLN Place into the right eye daily.   VOLTAREN 1 % GEL Apply topically as needed.   No facility-administered encounter medications on file as of 08/04/2022.     Lab Results  Component Value Date   WBC 7.9 08/04/2022   HGB 15.0 08/04/2022   HCT 45.9 08/04/2022   PLT 253.0 08/04/2022   GLUCOSE 86 08/04/2022   CHOL 185 08/04/2022   TRIG 142.0 08/04/2022   HDL 45.30 08/04/2022   LDLCALC 111 (H) 08/04/2022   ALT 66 (H) 08/04/2022   AST 63 (H) 08/04/2022   NA 137 08/04/2022   K 3.9 08/04/2022   CL 102 08/04/2022   CREATININE 0.83 08/04/2022   BUN 9 08/04/2022   CO2 26 08/04/2022   TSH 4.012 07/07/2022   INR 1.0 11/07/2012    No results found.     Assessment & Plan:  Routine general medical examination at a health care facility  Health care maintenance Assessment & Plan: Physical today 08/04/22.  Mammogram 06/09/2020 BI-RADS 1.  Overdue. Scheduled. Colonoscopy July 2015.  Overdue. Agreeable for referral to GI.    Hypercholesteremia Assessment & Plan: Low-cholesterol diet and exercise.  Follow lipid panel.  Orders: -     Lipid panel -     Hepatic function panel -     CBC with Differential/Platelet -     Basic metabolic panel  Hypothyroidism, unspecified type Assessment & Plan: On thyroid replacement.  Follow tsh.    Osteoporosis without current pathological fracture, unspecified osteoporosis type Assessment & Plan: Has previously declined prescription medication.  Will need f/u bone density.  Check vitamin D level.   Orders: -     VITAMIN D 25 Hydroxy (Vit-D Deficiency, Fractures)  Encounter for screening mammogram for malignant  neoplasm of breast -     3D Screening Mammogram, Left and Right; Future  Colon cancer screening -     Ambulatory referral to Gastroenterology  Iron deficiency anemia, unspecified iron deficiency anemia type Assessment & Plan: Colonoscopy 08/2013 - recommended f/u in 08/2018.  Overdue.  Agreeable for referral to GI.  Follow cbc.    Typical atrial flutter [facility]) Assessment & Plan: Cardiology f/u 07/18/22 - parox AFlutter - self converted to sinus bradycardia. Continues to have paroxysms of atrial flutter with controlled ventricular rate. TEE cardioversion canceled. Recommend f/u echo. Restart propafenone 225 twice daily. Stop Toprol   Depression, major, single episode, moderate (HCC) Assessment & Plan: Overall appears to be doing better. Continue prozac 10mg  q day.    Gastroesophageal reflux disease without esophagitis Assessment & Plan: EGD 08/2013 as outlined in overview. No upper symptoms reported.       Dale Erin, MD

## 2022-08-05 ENCOUNTER — Telehealth: Payer: Self-pay

## 2022-08-05 NOTE — Telephone Encounter (Signed)
-----   Message from Dale [place], MD sent at 08/05/2022  4:44 AM EDT ----- Notify - vitamin D level is low.  Given the level, I would like to start her on ergocalciferol 50,000 units q week.  (#12 with one refill).  Liver tests are elevated.  Given elevation - would like to check abdominal ultrasound to further evaluate the liver and to compare to previous ultrasound several years ago).  Also has been referred to GI.  Will need GI to f/u with liver as well.  We will follow.  Recheck liver panel in 3-4 weeks. Cholesterol levels are relatively stable.  Given calculated cholesterol risk, it is recommended for her to start a cholesterol medication.  Will d/w her more once liver issues sorted through.  Continue diet and exercise.   Hgb and kidney function tests are wnl.

## 2022-08-08 NOTE — Assessment & Plan Note (Signed)
On thyroid replacement.  Follow tsh.  

## 2022-08-08 NOTE — Assessment & Plan Note (Signed)
Has previously declined prescription medication.  Will need f/u bone density.  Check vitamin D level.

## 2022-08-08 NOTE — Assessment & Plan Note (Signed)
Colonoscopy 08/2013 - recommended f/u in 08/2018.  Overdue.  Agreeable for referral to GI.  Follow cbc.

## 2022-08-08 NOTE — Assessment & Plan Note (Addendum)
EGD 08/2013 as outlined in overview. No upper symptoms reported.

## 2022-08-08 NOTE — Assessment & Plan Note (Signed)
Cardiology f/u 07/18/22 - parox AFlutter - self converted to sinus bradycardia. Continues to have paroxysms of atrial flutter with controlled ventricular rate. TEE cardioversion canceled. Recommend f/u echo. Restart propafenone 225 twice daily. Stop Toprol

## 2022-08-08 NOTE — Assessment & Plan Note (Signed)
Overall appears to be doing better. Continue prozac 10mg  q day.

## 2022-08-08 NOTE — Assessment & Plan Note (Signed)
Low cholesterol diet and exercise.  Follow lipid panel.   

## 2022-08-10 ENCOUNTER — Other Ambulatory Visit: Payer: Self-pay | Admitting: Internal Medicine

## 2022-08-16 ENCOUNTER — Ambulatory Visit: Payer: Medicare Other | Admitting: Cardiology

## 2022-08-19 ENCOUNTER — Telehealth: Payer: Self-pay

## 2022-08-19 NOTE — Telephone Encounter (Signed)
-----   Message from Novi sent at 08/05/2022  4:44 AM EDT ----- Notify - vitamin D level is low.  Given the level, I would like to start her on ergocalciferol 50,000 units q week.  (#12 with one refill).  Liver tests are elevated.  Given elevation - would like to check abdominal ultrasound to further evaluate the liver and to compare to previous ultrasound several years ago).  Also has been referred to GI.  Will need GI to f/u with liver as well.  We will follow.  Recheck liver panel in 3-4 weeks. Cholesterol levels are relatively stable.  Given calculated cholesterol risk, it is recommended for her to start a cholesterol medication.  Will d/w her more once liver issues sorted through.  Continue diet and exercise.   Hgb and kidney function tests are wnl.

## 2022-08-23 ENCOUNTER — Other Ambulatory Visit: Payer: Self-pay

## 2022-08-23 DIAGNOSIS — R7989 Other specified abnormal findings of blood chemistry: Secondary | ICD-10-CM

## 2022-08-23 MED ORDER — VITAMIN D (ERGOCALCIFEROL) 1.25 MG (50000 UNIT) PO CAPS: 50000.0000 [IU] | ORAL_CAPSULE | ORAL | 1 refills | Status: DC

## 2022-08-26 ENCOUNTER — Encounter: Payer: Self-pay | Admitting: Internal Medicine

## 2022-08-26 NOTE — Telephone Encounter (Signed)
Reviewed.  She does at least need a  f/u liver panel within the next couple of weeks to confirm improving.

## 2022-09-03 ENCOUNTER — Other Ambulatory Visit: Payer: Self-pay | Admitting: Cardiology

## 2022-09-03 DIAGNOSIS — I4891 Unspecified atrial fibrillation: Secondary | ICD-10-CM

## 2022-09-03 DIAGNOSIS — I483 Typical atrial flutter: Secondary | ICD-10-CM

## 2022-09-05 NOTE — Telephone Encounter (Signed)
Prescription refill request for Eliquis received. Indication: Af;utter Last office visit: 07/18/22 ([facility])  Scr: 0.83 (08/04/22)  Age: 72 Weight: 92.3kg  Appropriate dose. Refill sent.

## 2022-09-20 ENCOUNTER — Other Ambulatory Visit: Payer: Self-pay | Admitting: Cardiology

## 2022-10-25 NOTE — Progress Notes (Deleted)
Cardiology Office Note Date:  10/25/2022  Patient ID:  Sydney, Anderson Jun 18, 1950, MRN [id number] PCP:  Dale [place], MD  Cardiologist:  Yvonne Kendall, MD Electrophysiologist: Lanier Prude, MD    Chief Complaint: Afib follow-up  History of Present Illness: Sydney Anderson is a 72 y.o. female with PMH notable for Aflutter, HTN, hypothyroid, atach, PACs; seen today for Lanier Prude, MD for routine electrophysiology followup.  Last saw Dr. Lalla Brothers 11/2020 for palpitation evaluation. Had worn a zio that showed SVT burden. He started her on propafenone. I saw 06/2022 for routine follow-up, and she was in aflutter with RVR, having chest discomfort, SOB, increased fatigue. Her propafenone was stopped so as to prevent converting her to NSR while not anticoagulated, Toprol and Eliquis started.  She was set up for a cardioversion 3 weeks later.  Prior to DCCV she called the office with c/o bradycardia. I saw her again 07/2022 where she felt much better and was in NSR. She had obtained Kardia mobile that had some afib readings, but a vast majority in NSR. Her propafenone was restarted.   On follow-up today,  *** AF burden, symptoms *** palpitations *** bleeding concerns  *** pulse at home  *** ETOH *** nicotine *** OSA s/s *** BP readings at home   She is diligently taking Eliquis twice daily, no bleeding concerns.    she denies chest pain, palpitations, dyspnea, PND, orthopnea, nausea, vomiting, dizziness, syncope, edema, weight gain, or early satiety.    AAD History: Propafenone, started 10/2020  Past Medical History:  Diagnosis Date   Arthritis    Asthma    Atrial tachycardia    Depression    Diverticulitis    GERD (gastroesophageal reflux disease)    Glaucoma    Hypertension    No issues since quit smoking   Hypothyroidism    Kidney stones    Migraines    None for several years   Myopia of both eyes    Spinal stenosis     Past Surgical  History:  Procedure Laterality Date   accident  2005   CATARACT EXTRACTION W/PHACO Left 06/27/2022   Procedure: CATARACT EXTRACTION PHACO AND INTRAOCULAR LENS PLACEMENT (IOC) LEFT 12.27 00:54.8;  Surgeon: Nevada Crane, MD;  Location: [facility];  Service: Ophthalmology;  Laterality: Left;   EYE SURGERY  2010,2011   FOOT SURGERY  2006   kidney stones  1980   SHOULDER ADHESION RELEASE  2008    Current Outpatient Medications  Medication Instructions   apixaban (ELIQUIS) 5 MG TABS tablet TAKE 1 TABLET(5 MG) BY MOUTH TWICE DAILY   FLUoxetine (PROZAC) 20 mg, Oral, Every other day   levothyroxine (SYNTHROID) 75 MCG tablet TAKE 1 TABLET(75 MCG) BY MOUTH DAILY   methylcellulose (ARTIFICIAL TEARS) 1 % ophthalmic solution 1 drop, As needed   prednisoLONE acetate (PRED FORTE) 1 % ophthalmic suspension APPLY 1 DROP TO THE RIGHT EYE 4 TIMES A DAY STARTING 2 DAYS BEFORE LASER TREATMENT   propafenone (RYTHMOL SR) 225 mg, Oral, 2 times daily   propafenone (RYTHMOL) 225 MG tablet TAKE 1 TABLET BY MOUTH IN THE MORNING AND 1 IN THE EVENING   sodium chloride (OCEAN) 0.65 % nasal spray 1 spray, As needed   Timolol Maleate 0.5 % (DAILY) SOLN Right Eye, Daily   Vitamin D (Ergocalciferol) (DRISDOL) 50,000 Units, Oral, Every 7 days   VOLTAREN 1 % GEL Topical, As needed    Social History:  The patient  reports that  she quit smoking about 15 years ago. Her smoking use included cigarettes. She has never used smokeless tobacco. She reports current alcohol use of about 15.0 standard drinks of alcohol per week. She reports that she does not use drugs.   Family History:  The patient's family history includes Arthritis in her mother; Breast cancer (age of onset: 87) in her paternal grandmother; Colon cancer in her father; Heart attack in her brother; Heart disease in her maternal aunt and maternal grandfather; Lung cancer in her mother; Mental illness in her father and mother; Stroke in her maternal aunt and  maternal grandfather.  ROS:  Please see the history of present illness. All other systems are reviewed and otherwise negative.   PHYSICAL EXAM:  VS:  There were no vitals taken for this visit. BMI: There is no height or weight on file to calculate BMI.  GEN- The patient is well appearing, alert and oriented x 3 today.   Lungs- Clear to ausculation bilaterally, normal work of breathing.  Heart- Regular rate and rhythm, no murmurs, rubs or gallops Extremities- No peripheral edema, warm, dry   EKG is ordered. Personal review of EKG from today shows:        SB, rate 59bpm, LAD  Recent Labs: 07/07/2022: Magnesium 2.2; TSH 4.012 08/04/2022: ALT 66; BUN 9; Creatinine, Ser 0.83; Hemoglobin 15.0; Platelets 253.0; Potassium 3.9; Sodium 137  08/04/2022: Cholesterol 185; HDL 45.30; LDL Cholesterol 111; Total CHOL/HDL Ratio 4; Triglycerides 142.0; VLDL 28.4   CrCl cannot be calculated (Patient's most recent lab result is older than the maximum 21 days allowed.).   Wt Readings from Last 3 Encounters:  08/04/22 203 lb 6.4 oz (92.3 kg)  07/18/22 202 lb (91.6 kg)  07/07/22 202 lb (91.6 kg)     Additional studies reviewed include: Previous EP, cardiology notes.   TTE, 07/30/2022 1. Left ventricular ejection fraction, by estimation, is 60 to 65%. The left ventricle has normal function. The left ventricle has no regional wall motion abnormalities. Left ventricular diastolic parameters were normal. The average left ventricular global longitudinal strain is -19.2 %.   2. Right ventricular systolic function is normal. The right ventricular size is normal. There is normal pulmonary artery systolic pressure. The estimated right ventricular systolic pressure is 29.2 mmHg.   3. The mitral valve is normal in structure. Mild mitral valve regurgitation. No evidence of mitral stenosis.   4. Tricuspid valve regurgitation is mild to moderate.   5. The aortic valve is tricuspid. Aortic valve regurgitation is not  visualized. Aortic valve sclerosis is present, with no evidence of aortic valve stenosis.   6. There is borderline dilatation of the ascending aorta, measuring 36 mm.   7. The inferior vena cava is normal in size with greater than 50% respiratory variability, suggesting right atrial pressure of 3 mmHg.    NM myoview, 11/09/2020   The study is normal. The study is low risk.   No ST deviation was noted.   LV perfusion is normal. There is no evidence of ischemia. There is no evidence of infarction.   Left ventricular function is normal. End diastolic cavity size is normal. End systolic cavity size is normal.   EKG was not interpretable at peak stress due to significant motion artifact but no significant changes noted in recovery.   CT attenuation images showed no significant coronary calcifications.   TTE, 08/26/2020  1. Left ventricular ejection fraction, by estimation, is 65 to 70%. The left ventricle has normal function. The left  ventricle has no regional wall motion abnormalities. Left ventricular diastolic parameters were normal.   2. Right ventricular systolic function is normal. The right ventricular size is normal.   3. The mitral valve is normal in structure. No evidence of mitral valve regurgitation.   4. The aortic valve was not well visualized. Aortic valve regurgitation is not visualized.   5. The inferior vena cava is normal in size with greater than 50% respiratory variability, suggesting right atrial pressure of 3 mmHg.    Long term monitor, 08/06/2020 The patient was monitored for 14 days. The predominant rhythm was sinus with an average rate of 66 bpm (range 43-120 bpm and sinus). There were occasional PACs and rare PVCs. 483 atrial runs occurred, lasting up to 17.9 seconds with a maximum rate of 182 bpm. There was no sustained arrhythmia or prolonged pause. Patient triggered events corresponded to normal sinus rhythm, PACs, and PVCs.  ASSESSMENT AND PLAN:  #)  palpitations #) SVT #) parox AFlutter Continues to have paroxysms of atrial flutter with controlled ventricular rate EKG with stable intervals today Continue propafenone 225 twice daily  #) Hypercoag d/t Aflutter CHA2DS2-VASc Score = 3 [CHF History: 0, HTN History: 1, Diabetes History: 0, Stroke History: 0, Vascular Disease History: 0, Age Score: 1, Gender Score: 1].  Therefore, the patient's annual risk of stroke is 3.2 %.  NOAC - 5 mg Eliquis twice daily, appropriately dosed No bleeding concerns   Current medicines are reviewed at length with the patient today.   The patient has concerns regarding her medicines.  The following changes were made today:   START propafenone 225mg  BID STOP toprol 25mg  nightly  Labs/ tests ordered today include:  No orders of the defined types were placed in this encounter.    Disposition: Follow up with EP APP in in 3 months   Signed, Sherie Don, NP  10/25/22  6:28 PM  Electrophysiology [facility]

## 2022-10-26 ENCOUNTER — Ambulatory Visit: Payer: Medicare Other | Admitting: Cardiology

## 2022-10-26 DIAGNOSIS — I483 Typical atrial flutter: Secondary | ICD-10-CM

## 2022-10-26 DIAGNOSIS — D6869 Other thrombophilia: Secondary | ICD-10-CM

## 2022-10-26 DIAGNOSIS — I471 Supraventricular tachycardia, unspecified: Secondary | ICD-10-CM

## 2022-11-07 NOTE — Progress Notes (Deleted)
Cardiology Office Note Date:  11/07/2022  Patient ID:  Sydney Anderson, Sydney Anderson 1950-12-05, MRN [id number] PCP:  Dale [place], MD  Cardiologist:  Yvonne Kendall, MD Electrophysiologist: Lanier Prude, MD    Chief Complaint: Afib follow-up  History of Present Illness: Sydney Anderson is a 72 y.o. female with PMH notable for Aflutter, HTN, hypothyroid, atach, PACs; seen today for Lanier Prude, MD for routine electrophysiology followup.  Last saw Dr. Lalla Brothers 11/2020 for palpitation evaluation. Had worn a zio that showed SVT burden. He started her on propafenone. I saw 06/2022 for routine follow-up, and she was in aflutter with RVR, having chest discomfort, SOB, increased fatigue. Her propafenone was stopped so as to prevent converting her to NSR while not anticoagulated, Toprol and Eliquis started.  She was set up for a cardioversion 3 weeks later.  Prior to DCCV she called the office with c/o bradycardia. I saw her again 07/2022 where she felt much better and was in NSR. She had obtained Kardia mobile that had some afib readings, but a vast majority in NSR. Her propafenone was restarted.   On follow-up today,  *** AF burden, symptoms *** palpitations *** bleeding concerns  *** pulse at home  *** ETOH *** nicotine *** OSA s/s *** BP readings at home   She is diligently taking Eliquis twice daily, no bleeding concerns.    she denies chest pain, palpitations, dyspnea, PND, orthopnea, nausea, vomiting, dizziness, syncope, edema, weight gain, or early satiety.    AAD History: Propafenone, started 10/2020  Past Medical History:  Diagnosis Date   Arthritis    Asthma    Atrial tachycardia    Depression    Diverticulitis    GERD (gastroesophageal reflux disease)    Glaucoma    Hypertension    No issues since quit smoking   Hypothyroidism    Kidney stones    Migraines    None for several years   Myopia of both eyes    Spinal stenosis     Past Surgical  History:  Procedure Laterality Date   accident  2005   CATARACT EXTRACTION W/PHACO Left 06/27/2022   Procedure: CATARACT EXTRACTION PHACO AND INTRAOCULAR LENS PLACEMENT (IOC) LEFT 12.27 00:54.8;  Surgeon: Nevada Crane, MD;  Location: [facility];  Service: Ophthalmology;  Laterality: Left;   EYE SURGERY  2010,2011   FOOT SURGERY  2006   kidney stones  1980   SHOULDER ADHESION RELEASE  2008    Current Outpatient Medications  Medication Instructions   apixaban (ELIQUIS) 5 MG TABS tablet TAKE 1 TABLET(5 MG) BY MOUTH TWICE DAILY   FLUoxetine (PROZAC) 20 mg, Oral, Every other day   levothyroxine (SYNTHROID) 75 MCG tablet TAKE 1 TABLET(75 MCG) BY MOUTH DAILY   methylcellulose (ARTIFICIAL TEARS) 1 % ophthalmic solution 1 drop, As needed   prednisoLONE acetate (PRED FORTE) 1 % ophthalmic suspension APPLY 1 DROP TO THE RIGHT EYE 4 TIMES A DAY STARTING 2 DAYS BEFORE LASER TREATMENT   propafenone (RYTHMOL SR) 225 mg, Oral, 2 times daily   propafenone (RYTHMOL) 225 MG tablet TAKE 1 TABLET BY MOUTH IN THE MORNING AND 1 IN THE EVENING   sodium chloride (OCEAN) 0.65 % nasal spray 1 spray, As needed   Timolol Maleate 0.5 % (DAILY) SOLN Right Eye, Daily   Vitamin D (Ergocalciferol) (DRISDOL) 50,000 Units, Oral, Every 7 days   VOLTAREN 1 % GEL Topical, As needed    Social History:  The patient  reports that  she quit smoking about 15 years ago. Her smoking use included cigarettes. She has never used smokeless tobacco. She reports current alcohol use of about 15.0 standard drinks of alcohol per week. She reports that she does not use drugs.   Family History:  The patient's family history includes Arthritis in her mother; Breast cancer (age of onset: 60) in her paternal grandmother; Colon cancer in her father; Heart attack in her brother; Heart disease in her maternal aunt and maternal grandfather; Lung cancer in her mother; Mental illness in her father and mother; Stroke in her maternal aunt and  maternal grandfather.  ROS:  Please see the history of present illness. All other systems are reviewed and otherwise negative.   PHYSICAL EXAM:  VS:  There were no vitals taken for this visit. BMI: There is no height or weight on file to calculate BMI.  GEN- The patient is well appearing, alert and oriented x 3 today.   Lungs- Clear to ausculation bilaterally, normal work of breathing.  Heart- Regular rate and rhythm, no murmurs, rubs or gallops Extremities- No peripheral edema, warm, dry   EKG is ordered. Personal review of EKG from today shows:       07/18/2022: NSR w PAC, rate 59; LAD  Recent Labs: 07/07/2022: Magnesium 2.2; TSH 4.012 08/04/2022: ALT 66; BUN 9; Creatinine, Ser 0.83; Hemoglobin 15.0; Platelets 253.0; Potassium 3.9; Sodium 137  08/04/2022: Cholesterol 185; HDL 45.30; LDL Cholesterol 111; Total CHOL/HDL Ratio 4; Triglycerides 142.0; VLDL 28.4   CrCl cannot be calculated (Patient's most recent lab result is older than the maximum 21 days allowed.).   Wt Readings from Last 3 Encounters:  08/04/22 203 lb 6.4 oz (92.3 kg)  07/18/22 202 lb (91.6 kg)  07/07/22 202 lb (91.6 kg)     Additional studies reviewed include: Previous EP, cardiology notes.   TTE, 07/30/2022 1. Left ventricular ejection fraction, by estimation, is 60 to 65%. The left ventricle has normal function. The left ventricle has no regional wall motion abnormalities. Left ventricular diastolic parameters were normal. The average left ventricular global longitudinal strain is -19.2 %.   2. Right ventricular systolic function is normal. The right ventricular size is normal. There is normal pulmonary artery systolic pressure. The estimated right ventricular systolic pressure is 29.2 mmHg.   3. The mitral valve is normal in structure. Mild mitral valve regurgitation. No evidence of mitral stenosis.   4. Tricuspid valve regurgitation is mild to moderate.   5. The aortic valve is tricuspid. Aortic valve  regurgitation is not visualized. Aortic valve sclerosis is present, with no evidence of aortic valve stenosis.   6. There is borderline dilatation of the ascending aorta, measuring 36 mm.   7. The inferior vena cava is normal in size with greater than 50% respiratory variability, suggesting right atrial pressure of 3 mmHg.    NM myoview, 11/09/2020   The study is normal. The study is low risk.   No ST deviation was noted.   LV perfusion is normal. There is no evidence of ischemia. There is no evidence of infarction.   Left ventricular function is normal. End diastolic cavity size is normal. End systolic cavity size is normal.   EKG was not interpretable at peak stress due to significant motion artifact but no significant changes noted in recovery.   CT attenuation images showed no significant coronary calcifications.   TTE, 08/26/2020  1. Left ventricular ejection fraction, by estimation, is 65 to 70%. The left ventricle has normal function.  The left ventricle has no regional wall motion abnormalities. Left ventricular diastolic parameters were normal.   2. Right ventricular systolic function is normal. The right ventricular size is normal.   3. The mitral valve is normal in structure. No evidence of mitral valve regurgitation.   4. The aortic valve was not well visualized. Aortic valve regurgitation is not visualized.   5. The inferior vena cava is normal in size with greater than 50% respiratory variability, suggesting right atrial pressure of 3 mmHg.    Long term monitor, 08/06/2020 The patient was monitored for 14 days. The predominant rhythm was sinus with an average rate of 66 bpm (range 43-120 bpm and sinus). There were occasional PACs and rare PVCs. 483 atrial runs occurred, lasting up to 17.9 seconds with a maximum rate of 182 bpm. There was no sustained arrhythmia or prolonged pause. Patient triggered events corresponded to normal sinus rhythm, PACs, and PVCs.  ASSESSMENT AND  PLAN:  #) palpitations #) SVT #) parox AFlutter Continues to have paroxysms of atrial flutter with controlled ventricular rate EKG with stable intervals today Continue propafenone 225 twice daily  #) Hypercoag d/t Aflutter CHA2DS2-VASc Score = 3 [CHF History: 0, HTN History: 1, Diabetes History: 0, Stroke History: 0, Vascular Disease History: 0, Age Score: 1, Gender Score: 1].  Therefore, the patient's annual risk of stroke is 3.2 %.  NOAC - 5 mg Eliquis twice daily, appropriately dosed No bleeding concerns   Current medicines are reviewed at length with the patient today.   The patient has concerns regarding her medicines.  The following changes were made today:   START propafenone 225mg  BID STOP toprol 25mg  nightly  Labs/ tests ordered today include:  No orders of the defined types were placed in this encounter.    Disposition: Follow up with EP APP in in 3 months   Signed, Sherie Don, NP  11/07/22  8:59 AM  Electrophysiology [facility]

## 2022-11-08 ENCOUNTER — Ambulatory Visit: Payer: Medicare Other | Admitting: Cardiology

## 2022-11-08 DIAGNOSIS — I471 Supraventricular tachycardia, unspecified: Secondary | ICD-10-CM

## 2022-11-08 DIAGNOSIS — I483 Typical atrial flutter: Secondary | ICD-10-CM

## 2022-11-08 DIAGNOSIS — D6869 Other thrombophilia: Secondary | ICD-10-CM

## 2022-11-08 NOTE — Progress Notes (Deleted)
Cardiology Office Note Date:  11/08/2022  Patient ID:  Sydney Anderson, [place] 08-Sep-1950, MRN [id number] PCP:  Dale [place], MD  Cardiologist:  Yvonne Kendall, MD Electrophysiologist: Lanier Prude, MD    Chief Complaint: Afib follow-up  History of Present Illness: Sydney Anderson is a 72 y.o. female with PMH notable for Aflutter, HTN, hypothyroid, atach, PACs; seen today for Lanier Prude, MD for routine electrophysiology followup.  Last saw Dr. Lalla Brothers 11/2020 for palpitation evaluation. Had worn a zio that showed SVT burden. He started her on propafenone. I saw 06/2022 for routine follow-up, and she was in aflutter with RVR, having chest discomfort, SOB, increased fatigue. Her propafenone was stopped so as to prevent converting her to NSR while not anticoagulated, Toprol and Eliquis started.  She was set up for a cardioversion 3 weeks later.  Prior to DCCV she called the office with c/o bradycardia. I saw her again 07/2022 where she felt much better and was in NSR. She had obtained Kardia mobile that had some afib readings, but a vast majority in NSR. Her propafenone was restarted.   On follow-up today,  *** AF burden, symptoms *** palpitations *** bleeding concerns  *** pulse at home  *** ETOH *** nicotine *** OSA s/s *** BP readings at home   She is diligently taking Eliquis twice daily, no bleeding concerns.    she denies chest pain, palpitations, dyspnea, PND, orthopnea, nausea, vomiting, dizziness, syncope, edema, weight gain, or early satiety.    AAD History: Propafenone, started 10/2020  Past Medical History:  Diagnosis Date   Arthritis    Asthma    Atrial tachycardia    Depression    Diverticulitis    GERD (gastroesophageal reflux disease)    Glaucoma    Hypertension    No issues since quit smoking   Hypothyroidism    Kidney stones    Migraines    None for several years   Myopia of both eyes    Spinal stenosis     Past Surgical  History:  Procedure Laterality Date   accident  2005   CATARACT EXTRACTION W/PHACO Left 06/27/2022   Procedure: CATARACT EXTRACTION PHACO AND INTRAOCULAR LENS PLACEMENT (IOC) LEFT 12.27 00:54.8;  Surgeon: Nevada Crane, MD;  Location: [facility];  Service: Ophthalmology;  Laterality: Left;   EYE SURGERY  2010,2011   FOOT SURGERY  2006   kidney stones  1980   SHOULDER ADHESION RELEASE  2008    Current Outpatient Medications  Medication Instructions   apixaban (ELIQUIS) 5 MG TABS tablet TAKE 1 TABLET(5 MG) BY MOUTH TWICE DAILY   FLUoxetine (PROZAC) 20 mg, Oral, Every other day   levothyroxine (SYNTHROID) 75 MCG tablet TAKE 1 TABLET(75 MCG) BY MOUTH DAILY   methylcellulose (ARTIFICIAL TEARS) 1 % ophthalmic solution 1 drop, As needed   prednisoLONE acetate (PRED FORTE) 1 % ophthalmic suspension APPLY 1 DROP TO THE RIGHT EYE 4 TIMES A DAY STARTING 2 DAYS BEFORE LASER TREATMENT   propafenone (RYTHMOL SR) 225 mg, Oral, 2 times daily   propafenone (RYTHMOL) 225 MG tablet TAKE 1 TABLET BY MOUTH IN THE MORNING AND 1 IN THE EVENING   sodium chloride (OCEAN) 0.65 % nasal spray 1 spray, As needed   Timolol Maleate 0.5 % (DAILY) SOLN Right Eye, Daily   Vitamin D (Ergocalciferol) (DRISDOL) 50,000 Units, Oral, Every 7 days   VOLTAREN 1 % GEL Topical, As needed    Social History:  The patient  reports that  she quit smoking about 15 years ago. Her smoking use included cigarettes. She has never used smokeless tobacco. She reports current alcohol use of about 15.0 standard drinks of alcohol per week. She reports that she does not use drugs.   Family History:  The patient's family history includes Arthritis in her mother; Breast cancer (age of onset: 53) in her paternal grandmother; Colon cancer in her father; Heart attack in her brother; Heart disease in her maternal aunt and maternal grandfather; Lung cancer in her mother; Mental illness in her father and mother; Stroke in her maternal aunt and  maternal grandfather.  ROS:  Please see the history of present illness. All other systems are reviewed and otherwise negative.   PHYSICAL EXAM:  VS:  There were no vitals taken for this visit. BMI: There is no height or weight on file to calculate BMI.  GEN- The patient is well appearing, alert and oriented x 3 today.   Lungs- Clear to ausculation bilaterally, normal work of breathing.  Heart- Regular rate and rhythm, no murmurs, rubs or gallops Extremities- No peripheral edema, warm, dry   EKG is ordered. Personal review of EKG from today shows:       07/18/2022: NSR w PAC, rate 59; LAD  Recent Labs: 07/07/2022: Magnesium 2.2; TSH 4.012 08/04/2022: ALT 66; BUN 9; Creatinine, Ser 0.83; Hemoglobin 15.0; Platelets 253.0; Potassium 3.9; Sodium 137  08/04/2022: Cholesterol 185; HDL 45.30; LDL Cholesterol 111; Total CHOL/HDL Ratio 4; Triglycerides 142.0; VLDL 28.4   CrCl cannot be calculated (Patient's most recent lab result is older than the maximum 21 days allowed.).   Wt Readings from Last 3 Encounters:  08/04/22 203 lb 6.4 oz (92.3 kg)  07/18/22 202 lb (91.6 kg)  07/07/22 202 lb (91.6 kg)     Additional studies reviewed include: Previous EP, cardiology notes.   TTE, 07/30/2022 1. Left ventricular ejection fraction, by estimation, is 60 to 65%. The left ventricle has normal function. The left ventricle has no regional wall motion abnormalities. Left ventricular diastolic parameters were normal. The average left ventricular global longitudinal strain is -19.2 %.   2. Right ventricular systolic function is normal. The right ventricular size is normal. There is normal pulmonary artery systolic pressure. The estimated right ventricular systolic pressure is 29.2 mmHg.   3. The mitral valve is normal in structure. Mild mitral valve regurgitation. No evidence of mitral stenosis.   4. Tricuspid valve regurgitation is mild to moderate.   5. The aortic valve is tricuspid. Aortic valve  regurgitation is not visualized. Aortic valve sclerosis is present, with no evidence of aortic valve stenosis.   6. There is borderline dilatation of the ascending aorta, measuring 36 mm.   7. The inferior vena cava is normal in size with greater than 50% respiratory variability, suggesting right atrial pressure of 3 mmHg.    NM myoview, 11/09/2020   The study is normal. The study is low risk.   No ST deviation was noted.   LV perfusion is normal. There is no evidence of ischemia. There is no evidence of infarction.   Left ventricular function is normal. End diastolic cavity size is normal. End systolic cavity size is normal.   EKG was not interpretable at peak stress due to significant motion artifact but no significant changes noted in recovery.   CT attenuation images showed no significant coronary calcifications.   TTE, 08/26/2020  1. Left ventricular ejection fraction, by estimation, is 65 to 70%. The left ventricle has normal function.  The left ventricle has no regional wall motion abnormalities. Left ventricular diastolic parameters were normal.   2. Right ventricular systolic function is normal. The right ventricular size is normal.   3. The mitral valve is normal in structure. No evidence of mitral valve regurgitation.   4. The aortic valve was not well visualized. Aortic valve regurgitation is not visualized.   5. The inferior vena cava is normal in size with greater than 50% respiratory variability, suggesting right atrial pressure of 3 mmHg.    Long term monitor, 08/06/2020 The patient was monitored for 14 days. The predominant rhythm was sinus with an average rate of 66 bpm (range 43-120 bpm and sinus). There were occasional PACs and rare PVCs. 483 atrial runs occurred, lasting up to 17.9 seconds with a maximum rate of 182 bpm. There was no sustained arrhythmia or prolonged pause. Patient triggered events corresponded to normal sinus rhythm, PACs, and PVCs.  ASSESSMENT AND  PLAN:  #) palpitations #) SVT #) parox AFlutter Continues to have paroxysms of atrial flutter with controlled ventricular rate EKG with stable intervals today Continue propafenone 225 twice daily  #) Hypercoag d/t Aflutter CHA2DS2-VASc Score = 3 [CHF History: 0, HTN History: 1, Diabetes History: 0, Stroke History: 0, Vascular Disease History: 0, Age Score: 1, Gender Score: 1].  Therefore, the patient's annual risk of stroke is 3.2 %.  NOAC - 5 mg Eliquis twice daily, appropriately dosed No bleeding concerns   Current medicines are reviewed at length with the patient today.   The patient has concerns regarding her medicines.  The following changes were made today:   START propafenone 225mg  BID STOP toprol 25mg  nightly  Labs/ tests ordered today include:  No orders of the defined types were placed in this encounter.    Disposition: Follow up with EP APP in in 3 months   Signed, Sherie Don, NP  11/08/22  10:05 AM  Electrophysiology [facility]

## 2022-11-09 ENCOUNTER — Telehealth: Payer: Self-pay

## 2022-11-09 NOTE — Patient Outreach (Signed)
  Care Coordination   11/09/2022 Name: Sydney Anderson MRN: [id number] DOB: 01-11-1951   Care Coordination Outreach Attempts:  An unsuccessful telephone outreach was attempted today to offer the patient information about available care coordination services. HIPAA compliant message left with return call phone number.   Follow Up Plan:  Additional outreach attempts will be made to offer the patient care coordination information and services.   Encounter Outcome:  No Answer   Care Coordination Interventions:  No, not indicated    George Ina [facility] Care Coordination [phone] direct line

## 2022-11-14 ENCOUNTER — Telehealth: Payer: Self-pay | Admitting: Internal Medicine

## 2022-11-14 NOTE — Telephone Encounter (Signed)
Copied from CRM [id number]. Topic: Medicare AWV >> Nov 14, 2022 11:16 AM Payton Doughty wrote: Reason for CRM: Called LVM 11/14/2022 to schedule AWV   Verlee Rossetti; Care Guide Ambulatory Clinical Support [facility] [facility] Direct Dial: [phone]

## 2022-11-15 ENCOUNTER — Ambulatory Visit: Payer: Medicare Other | Admitting: Cardiology

## 2022-11-15 DIAGNOSIS — I471 Supraventricular tachycardia, unspecified: Secondary | ICD-10-CM

## 2022-11-15 DIAGNOSIS — I483 Typical atrial flutter: Secondary | ICD-10-CM

## 2022-11-15 DIAGNOSIS — D6869 Other thrombophilia: Secondary | ICD-10-CM

## 2022-11-21 DIAGNOSIS — L7211 Pilar cyst: Secondary | ICD-10-CM | POA: Diagnosis not present

## 2022-11-22 ENCOUNTER — Telehealth: Payer: Self-pay | Admitting: Internal Medicine

## 2022-11-22 NOTE — Telephone Encounter (Signed)
*  STAT* If patient is at the pharmacy, call can be transferred to refill team.   1. Which medications need to be refilled? (please list name of each medication and dose if known)   propafenone (RYTHMOL) 225 MG tablet   2. Would you like to learn more about the convenience, safety, & potential cost savings by using the [facility]?   3. Are you open to using the [facility] (Type [facility]. ).  4. Which pharmacy/location (including street and city if local pharmacy) is medication to be sent to?  [id number] - [place], [place] - [address] AT [facility] OF [address]   5. Do they need a 30 day or 90 day supply?   90 day  Patient stated she will run out of this medication on 10/17.

## 2022-11-23 MED ORDER — PROPAFENONE HCL ER 225 MG PO CP12: 225.0000 mg | ORAL_CAPSULE | Freq: Two times a day (BID) | ORAL | 0 refills | Status: DC

## 2022-11-23 NOTE — Telephone Encounter (Signed)
Please contact pt for future appointment. ?Pt overdue for 3 month f/u. ?

## 2022-11-23 NOTE — Telephone Encounter (Signed)
Requested Prescriptions   Signed Prescriptions Disp Refills   propafenone (RYTHMOL SR) 225 MG 12 hr capsule 180 capsule 0    Sig: Take 1 capsule (225 mg total) by mouth 2 (two) times daily.    Authorizing Provider: Sherie Don    Ordering User: Kendrick Fries

## 2022-11-25 ENCOUNTER — Telehealth: Payer: Self-pay

## 2022-11-25 NOTE — Telephone Encounter (Signed)
Called patient, advised that we received information from her pharmacy that they were having trouble getting her propafenone 225 mg tablets, I advised with NP- who wanted to check a different pharmacy. I called patient to see if there was another pharmacy or if we could use a [facility] to get the medication. Patient did not answer, LVM with call back number to advise.

## 2022-11-29 ENCOUNTER — Other Ambulatory Visit: Payer: Self-pay | Admitting: Internal Medicine

## 2022-12-01 ENCOUNTER — Encounter: Payer: Self-pay | Admitting: Internal Medicine

## 2022-12-01 ENCOUNTER — Encounter: Payer: Self-pay | Admitting: Cardiology

## 2022-12-02 ENCOUNTER — Other Ambulatory Visit: Payer: Self-pay

## 2022-12-02 ENCOUNTER — Other Ambulatory Visit: Payer: Self-pay | Admitting: Cardiology

## 2022-12-02 MED ORDER — FLUOXETINE HCL 20 MG PO CAPS: 20.0000 mg | ORAL_CAPSULE | Freq: Every day | ORAL | 0 refills | Status: DC

## 2022-12-05 ENCOUNTER — Ambulatory Visit: Payer: Medicare Other | Admitting: Internal Medicine

## 2022-12-08 NOTE — Telephone Encounter (Signed)
See mychart messages. Patient will respond with new pharmacy if she needs to switch.  Thanks!

## 2022-12-12 ENCOUNTER — Telehealth: Payer: Self-pay | Admitting: Internal Medicine

## 2022-12-12 NOTE — Telephone Encounter (Signed)
Copied from CRM [id number]. Topic: Medicare AWV >> Dec 12, 2022 11:48 AM Payton Doughty wrote: Reason for CRM: Called LVM 12/12/2022 to schedule Annual Wellness Visit  Verlee Rossetti; Care Guide Ambulatory Clinical Support [place] [facility] Direct Dial: [phone]

## 2022-12-22 ENCOUNTER — Ambulatory Visit: Payer: Medicare Other | Admitting: Internal Medicine

## 2023-01-07 IMAGING — MG MM DIGITAL SCREENING BILAT W/ TOMO AND CAD
8 series · 8 of 24 positions shown · non-contrast
Comparison: Previous exam(s).

ACR Breast Density Category a: The breast tissue is almost entirely
fatty.

CLINICAL DATA: Screening.

EXAM:
DIGITAL SCREENING BILATERAL MAMMOGRAM WITH TOMOSYNTHESIS AND CAD
TECHNIQUE: Bilateral screening digital craniocaudal and mediolateral oblique
mammograms were obtained. Bilateral screening digital breast
tomosynthesis was performed. The images were evaluated with
computer-aided detection.

[L MLO synth-2D]
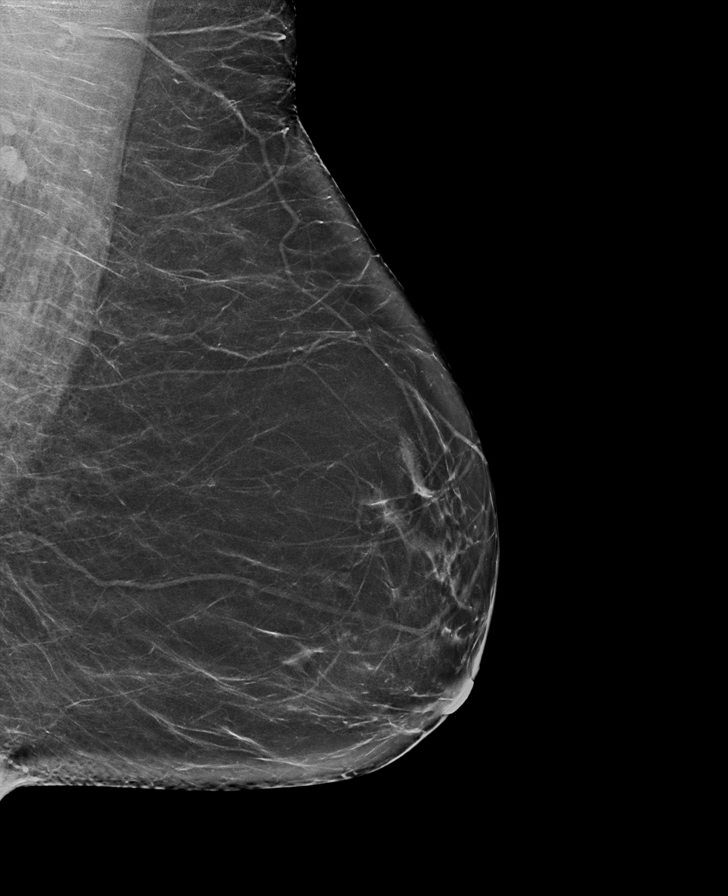

[R CC synth-2D]
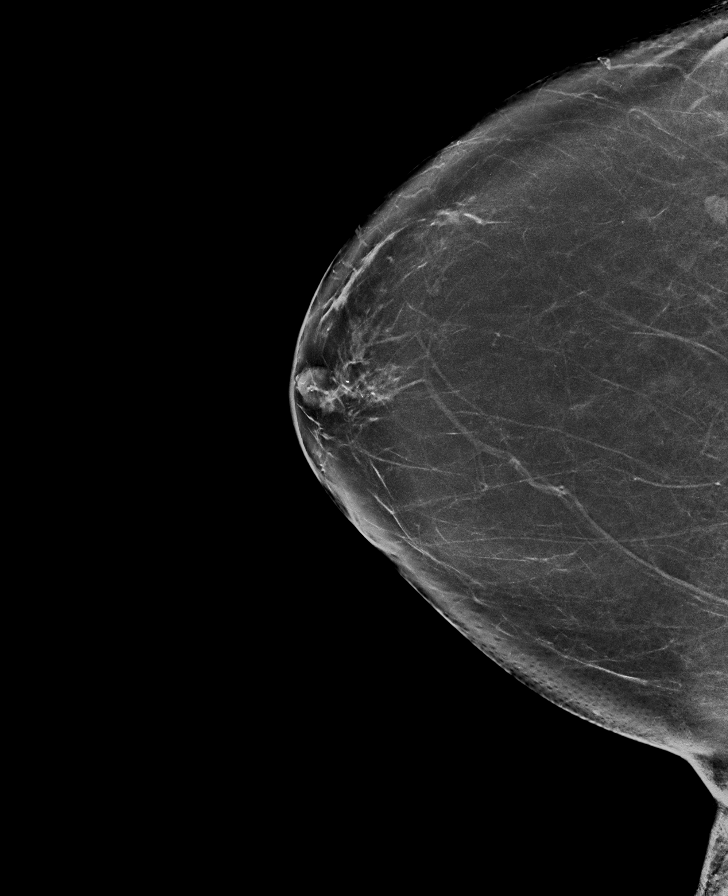

[L CC synth-2D]
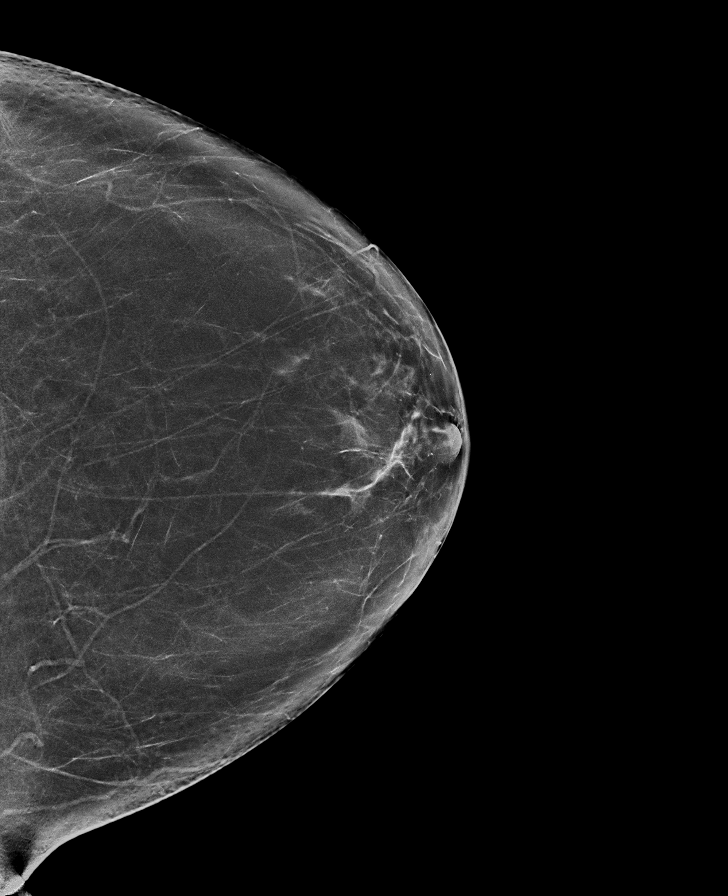

[R MLO synth-2D]
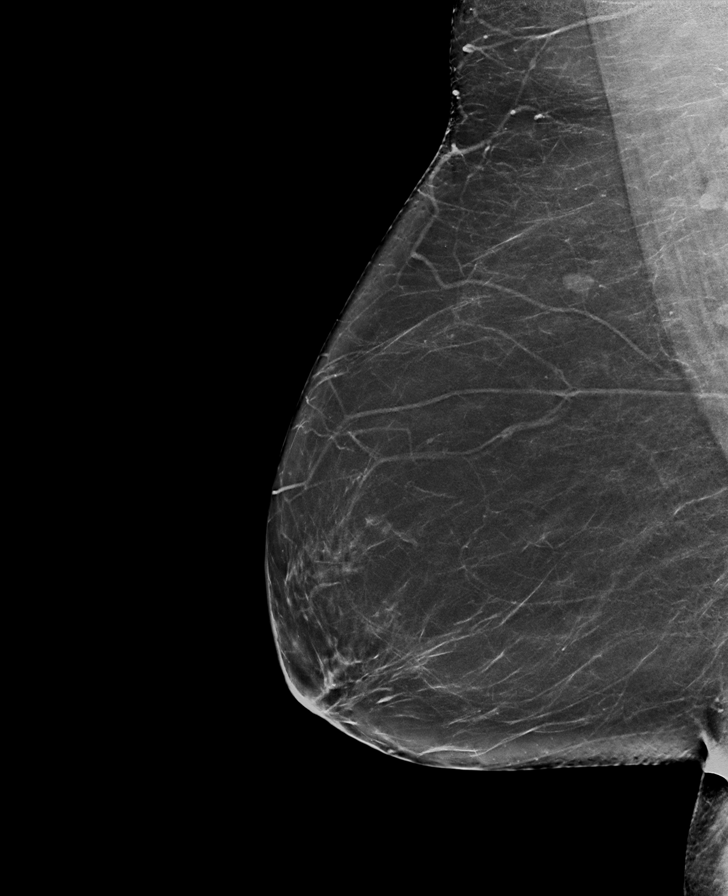

[L MLO tomo · tomo slice 38/75.0]
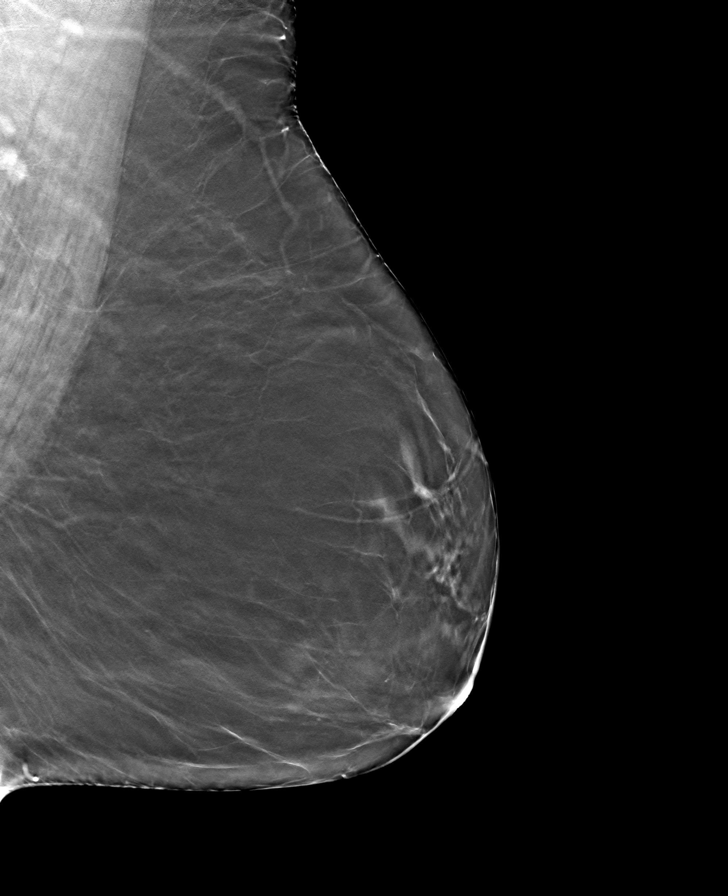

[R CC tomo · tomo slice 37/74.0]
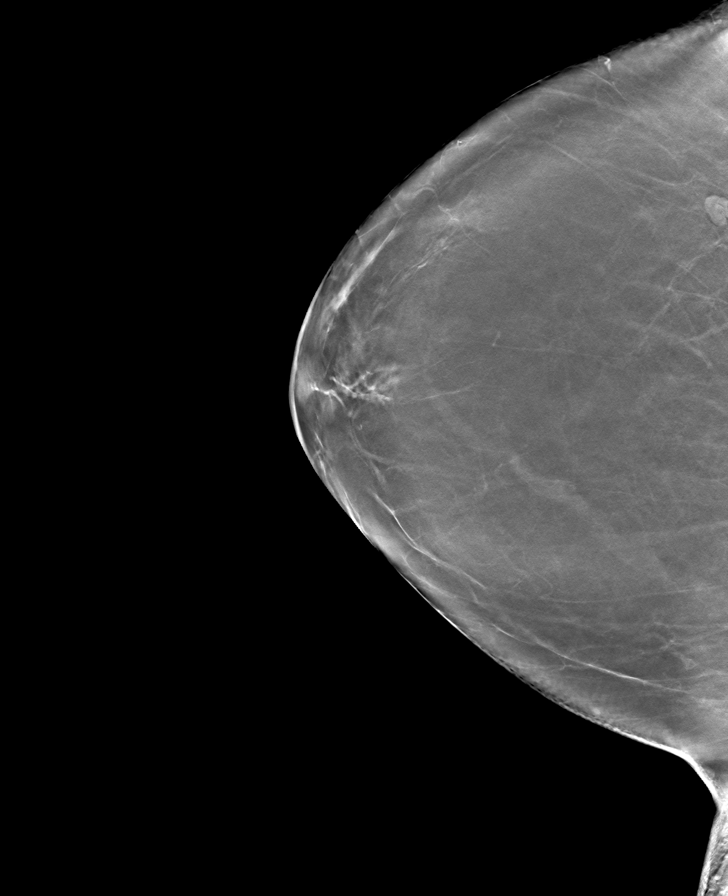

[R MLO tomo · tomo slice 38/75.0]
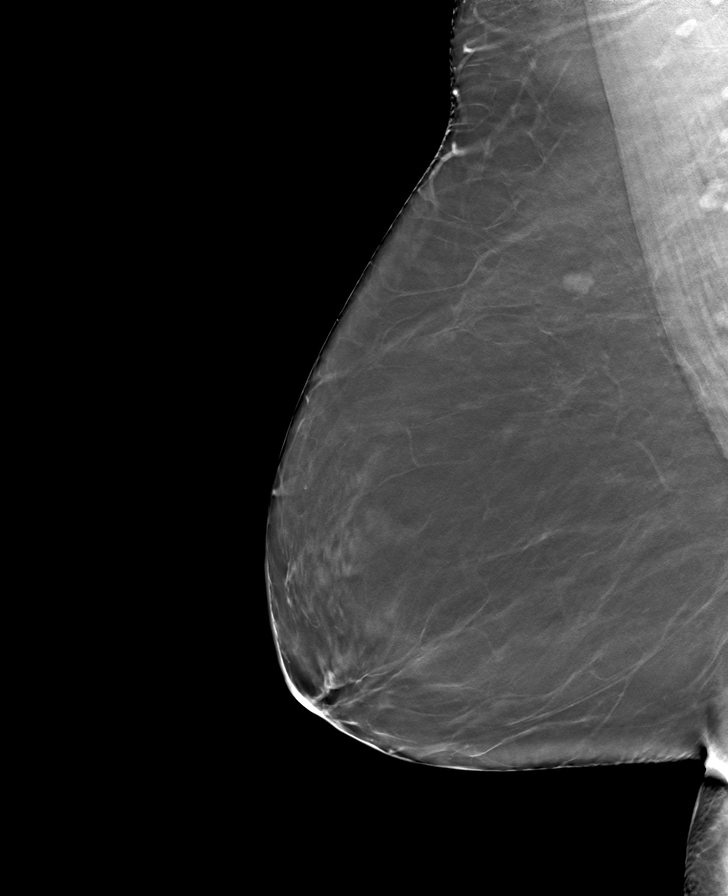

[L CC tomo · tomo slice 39/76.0]
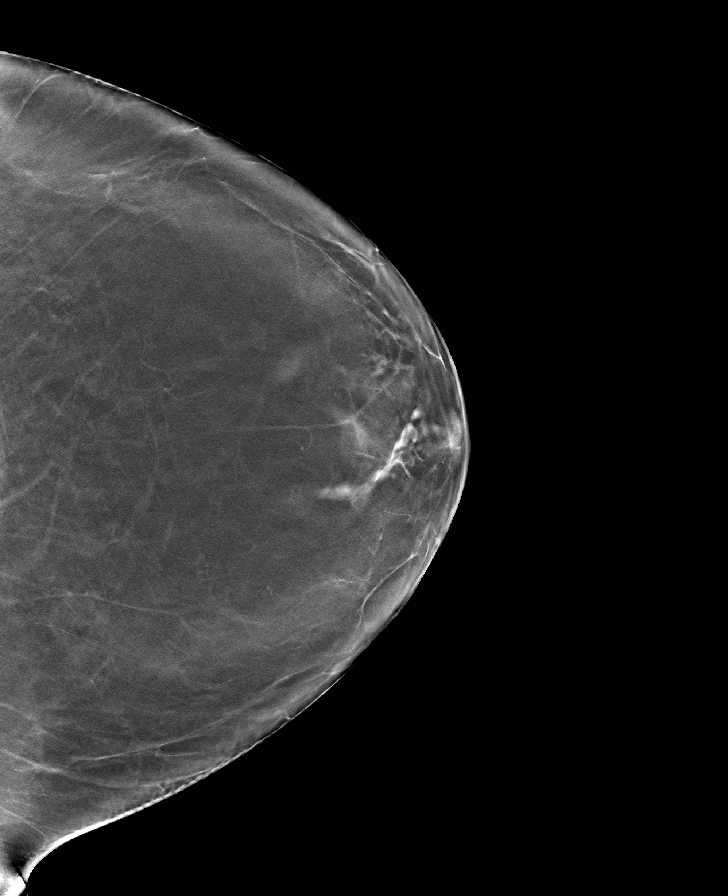

[8 of 24 positions shown; findings below may reference images not displayed]

FINDINGS: There are no findings suspicious for malignancy. The images were
evaluated with computer-aided detection.
IMPRESSION: No mammographic evidence of malignancy. A result letter of this
screening mammogram will be mailed directly to the patient.

RECOMMENDATION:
Screening mammogram in one year. (Code:JP-J-DD5)

BI-RADS CATEGORY  1: Negative.

## 2023-01-10 ENCOUNTER — Ambulatory Visit: Payer: Medicare Other | Admitting: Internal Medicine

## 2023-01-22 ENCOUNTER — Other Ambulatory Visit: Payer: Self-pay | Admitting: Internal Medicine

## 2023-02-06 ENCOUNTER — Other Ambulatory Visit: Payer: Self-pay | Admitting: Internal Medicine

## 2023-02-15 ENCOUNTER — Telehealth: Payer: Self-pay | Admitting: Internal Medicine

## 2023-02-15 NOTE — Telephone Encounter (Signed)
 Copied from CRM [id number]. Topic: Medicare AWV >> Feb 15, 2023  3:35 PM Nathanel DEL wrote: Reason for CRM: Called LVM 02/15/2023 to schedule AWV. Please schedule office or virtual visits  Nathanel Paschal; Care Guide Ambulatory Clinical Support [place] [facility] Direct Dial: [phone]

## 2023-03-05 ENCOUNTER — Other Ambulatory Visit: Payer: Self-pay | Admitting: Internal Medicine

## 2023-03-05 DIAGNOSIS — I4891 Unspecified atrial fibrillation: Secondary | ICD-10-CM

## 2023-03-06 NOTE — Progress Notes (Deleted)
 Cardiology Office Note Date:  03/06/2023  Patient ID:  Sydney Anderson, Sydney Anderson 1950/06/09, MRN [id number] PCP:  Dale [place], MD  Cardiologist:  Yvonne Kendall, MD Electrophysiologist: Lanier Prude, MD    Chief Complaint: Afib follow-up  History of Present Illness: Sydney Anderson is a 73 y.o. female with PMH notable for afib w RVR, Aflutter, atach, HTN, hypothyroid, PACs; seen today for Lanier Prude, MD for routine electrophysiology followup.  Last saw Dr. Lalla Brothers 11/2020 for palpitation evaluation. Had worn a zio that showed SVT burden. He started her on propafenone. I last saw her 07/2022, where she was having paroxysms of AFib with controlled ventricular rates. Using KardiaMobile to monitor.  On follow-up today, *** AF burden, symptoms *** palpitations *** bleeding concerns     She is diligently taking Eliquis twice daily, no bleeding concerns.    she denies dyspnea, PND, orthopnea, nausea, vomiting, dizziness, syncope, edema, weight gain, or early satiety.    AAD History: Propafenone, started 10/2020  ROS:  Please see the history of present illness. All other systems are reviewed and otherwise negative.   PHYSICAL EXAM:  VS:  There were no vitals taken for this visit. BMI: There is no height or weight on file to calculate BMI.  Wt Readings from Last 3 Encounters:  08/04/22 203 lb 6.4 oz (92.3 kg)  07/18/22 202 lb (91.6 kg)  07/07/22 202 lb (91.6 kg)    GEN- The patient is well appearing, alert and oriented x 3 today.   Lungs- Clear to ausculation bilaterally, normal work of breathing.  Heart- Regular rate and rhythm, no murmurs, rubs or gallops Extremities- No peripheral edema, warm, dry   EKG is ordered. Personal review of EKG from today shows: SB, rate 59bpm, LAD   Additional studies reviewed include: Previous EP, cardiology notes.   TTE, 07/29/2022  1. Left ventricular ejection fraction, by estimation, is 60 to 65%. The left  ventricle has normal function. The left ventricle has no regional wall motion abnormalities. Left ventricular diastolic parameters were normal. The average left ventricular  global longitudinal strain is -19.2 %.   2. Right ventricular systolic function is normal. The right ventricular size is normal. There is normal pulmonary artery systolic pressure. The estimated right ventricular systolic pressure is 29.2 mmHg.   3. The mitral valve is normal in structure. Mild mitral valve regurgitation. No evidence of mitral stenosis.   4. Tricuspid valve regurgitation is mild to moderate.   5. The aortic valve is tricuspid. Aortic valve regurgitation is not visualized. Aortic valve sclerosis is present, with no evidence of aortic valve stenosis.   6. There is borderline dilatation of the ascending aorta, measuring 36 mm.   7. The inferior vena cava is normal in size with greater than 50% respiratory variability, suggesting right atrial pressure of 3 mmHg.   NM myoview, 11/09/2020   The study is normal. The study is low risk.   No ST deviation was noted.   LV perfusion is normal. There is no evidence of ischemia. There is no evidence of infarction.   Left ventricular function is normal. End diastolic cavity size is normal. End systolic cavity size is normal.   EKG was not interpretable at peak stress due to significant motion artifact but no significant changes noted in recovery.   CT attenuation images showed no significant coronary calcifications.   TTE, 08/26/2020  1. Left ventricular ejection fraction, by estimation, is 65 to 70%. The left ventricle has normal function. The  left ventricle has no regional wall motion abnormalities. Left ventricular diastolic parameters were normal.   2. Right ventricular systolic function is normal. The right ventricular size is normal.   3. The mitral valve is normal in structure. No evidence of mitral valve regurgitation.   4. The aortic valve was not well visualized.  Aortic valve regurgitation is not visualized.   5. The inferior vena cava is normal in size with greater than 50% respiratory variability, suggesting right atrial pressure of 3 mmHg.    Long term monitor, 08/06/2020 The patient was monitored for 14 days. The predominant rhythm was sinus with an average rate of 66 bpm (range 43-120 bpm and sinus). There were occasional PACs and rare PVCs. 483 atrial runs occurred, lasting up to 17.9 seconds with a maximum rate of 182 bpm. There was no sustained arrhythmia or prolonged pause. Patient triggered events corresponded to normal sinus rhythm, PACs, and PVCs.  ASSESSMENT AND PLAN:  #) palpitations #) SVT #) parox AFlutter Maintaining sinus on propafenone   #) Hypercoag d/t Aflutter CHA2DS2-VASc Score = 3 [CHF History: 0, HTN History: 1, Diabetes History: 0, Stroke History: 0, Vascular Disease History: 0, Age Score: 1, Gender Score: 1].  Therefore, the patient's annual risk of stroke is 3.2 %.  NOAC - 5 mg Eliquis twice daily, appropriately dosed No bleeding concerns   Current medicines are reviewed at length with the patient today.   The patient has concerns regarding her medicines.  The following changes were made today:     Labs/ tests ordered today include:  No orders of the defined types were placed in this encounter.    Disposition: Follow up with EP APP in in 3 months   Signed, Sherie Don, NP  03/06/23  8:50 AM  Electrophysiology [facility]

## 2023-03-06 NOTE — Telephone Encounter (Signed)
Prescription refill request for Eliquis received. Indication:afib Last office visit:6/24 Scr:0.83  6/24 Age: 73 Weight:92.3  kg  Prescription refilled

## 2023-03-07 ENCOUNTER — Ambulatory Visit: Payer: Medicare Other | Admitting: Cardiology

## 2023-03-07 DIAGNOSIS — D6869 Other thrombophilia: Secondary | ICD-10-CM

## 2023-03-07 DIAGNOSIS — I471 Supraventricular tachycardia, unspecified: Secondary | ICD-10-CM

## 2023-03-07 DIAGNOSIS — I4891 Unspecified atrial fibrillation: Secondary | ICD-10-CM

## 2023-03-07 DIAGNOSIS — I483 Typical atrial flutter: Secondary | ICD-10-CM

## 2023-03-07 DIAGNOSIS — R002 Palpitations: Secondary | ICD-10-CM

## 2023-03-08 ENCOUNTER — Ambulatory Visit: Payer: Medicare Other | Admitting: Internal Medicine

## 2023-03-15 NOTE — Progress Notes (Deleted)
 Cardiology Office Note Date:  03/15/2023  Patient ID:  Sydney Anderson, Sydney Anderson [birth year], MRN [id number] PCP:  Glendia Shad, MD  Cardiologist:  Lonni Hanson, MD Electrophysiologist: OLE ONEIDA HOLTS, MD    Chief Complaint: Afib follow-up  History of Present Illness: Sydney Anderson is a 73 y.o. female with PMH notable for afib w RVR, Aflutter, atach, HTN, hypothyroid, PACs; seen today for OLE ONEIDA HOLTS, MD for routine electrophysiology followup.  Last saw Dr. Holts 11/2020 for palpitation evaluation. Had worn a zio that showed SVT burden. He started her on propafenone . I last saw her 07/2022, where she was having paroxysms of AFib with controlled ventricular rates. Using K[employer]ia[employer] to monitor.  On follow-up today, *** AF burden, symptoms *** palpitations *** bleeding concerns     She is diligently taking Eliquis  twice daily, no bleeding concerns.    she denies dyspnea, PND, orthopnea, nausea, vomiting, dizziness, syncope, edema, weight gain, or early satiety.    AAD History: Propafenone , started 10/2020  ROS:  Please see the history of present illness. All other systems are reviewed and otherwise negative.   PHYSICAL EXAM:  VS:  There were no vitals taken for this visit. BMI: There is no height or weight on file to calculate BMI.  Wt Readings from Last 3 Encounters:  08/04/22 203 lb 6.4 oz (92.3 kg)  07/18/22 202 lb (91.6 kg)  07/07/22 202 lb (91.6 kg)    GEN- The patient is well appearing, alert and oriented x 3 today.   Lungs- Clear to ausculation bilaterally, normal work of breathing.  Heart- Regular rate and rhythm, no murmurs, rubs or gallops Extremities- No peripheral edema, warm, dry   EKG is ordered. Personal review of EKG from today shows: SB, rate 59bpm, LAD   Additional studies reviewed include: Previous EP, cardiology notes.   TTE, 07/29/2022  1. Left ventricular ejection fraction, by estimation, is 60 to 65%. The left ventricle  has normal function. The left ventricle has no regional wall motion abnormalities. Left ventricular diastolic parameters were normal. The average left ventricular  global longitudinal strain is -19.2 %.   2. Right ventricular systolic function is normal. The right ventricular size is normal. There is normal pulmonary artery systolic pressure. The estimated right ventricular systolic pressure is 29.2 mmHg.   3. The mitral valve is normal in structure. Mild mitral valve regurgitation. No evidence of mitral stenosis.   4. Tricuspid valve regurgitation is mild to moderate.   5. The aortic valve is tricuspid. Aortic valve regurgitation is not visualized. Aortic valve sclerosis is present, with no evidence of aortic valve stenosis.   6. There is borderline dilatation of the ascending aorta, measuring 36 mm.   7. The inferior vena cava is normal in size with greater than 50% respiratory variability, suggesting right atrial pressure of 3 mmHg.   NM myoview , 11/09/2020   The study is normal. The study is low risk.   No ST deviation was noted.   LV perfusion is normal. There is no evidence of ischemia. There is no evidence of infarction.   Left ventricular function is normal. End diastolic cavity size is normal. End systolic cavity size is normal.   EKG was not interpretable at peak stress due to significant motion artifact but no significant changes noted in recovery.   CT attenuation images showed no significant coronary calcifications.   TTE, 08/26/2020  1. Left ventricular ejection fraction, by estimation, is 65 to 70%. The left ventricle has normal function. The  left ventricle has no regional wall motion abnormalities. Left ventricular diastolic parameters were normal.   2. Right ventricular systolic function is normal. The right ventricular size is normal.   3. The mitral valve is normal in structure. No evidence of mitral valve regurgitation.   4. The aortic valve was not well visualized. Aortic  valve regurgitation is not visualized.   5. The inferior vena cava is normal in size with greater than 50% respiratory variability, suggesting right atrial pressure of 3 mmHg.    Long term monitor, 08/06/2020 The patient was monitored for 14 days. The predominant rhythm was sinus with an average rate of 66 bpm (range 43-120 bpm and sinus). There were occasional PACs and rare PVCs. 483 atrial runs occurred, lasting up to 17.9 seconds with a maximum rate of 182 bpm. There was no sustained arrhythmia or prolonged pause. Patient triggered events corresponded to normal sinus rhythm, PACs, and PVCs.   ASSESSMENT AND PLAN:  #) palpitations #) SVT #) parox AFlutter Maintaining sinus on propafenone    #) Hypercoag d/t Aflutter CHA2DS2-VASc Score = 3 [CHF History: 0, HTN History: 1, Diabetes History: 0, Stroke History: 0, Vascular Disease History: 0, Age Score: 1, Gender Score: 1].  Therefore, the patient's annual risk of stroke is 3.2 %.  NOAC - 5 mg Eliquis  twice daily, appropriately dosed No bleeding concerns   Current medicines are reviewed at length with the patient today.   The patient has concerns regarding her medicines.  The following changes were made today:     Labs/ tests ordered today include:  No orders of the defined types were placed in this encounter.    Disposition: Follow up with EP APP in in 3 months   Signed, Valaria Kohut, NP  03/15/23  3:32 PM  Electrophysiology [facility]

## 2023-03-16 ENCOUNTER — Ambulatory Visit: Payer: Medicare Other | Admitting: Cardiology

## 2023-03-16 DIAGNOSIS — D6869 Other thrombophilia: Secondary | ICD-10-CM

## 2023-03-16 DIAGNOSIS — R002 Palpitations: Secondary | ICD-10-CM

## 2023-03-16 DIAGNOSIS — I483 Typical atrial flutter: Secondary | ICD-10-CM

## 2023-03-16 DIAGNOSIS — I471 Supraventricular tachycardia, unspecified: Secondary | ICD-10-CM

## 2023-03-20 DIAGNOSIS — L538 Other specified erythematous conditions: Secondary | ICD-10-CM | POA: Diagnosis not present

## 2023-03-20 DIAGNOSIS — L728 Other follicular cysts of the skin and subcutaneous tissue: Secondary | ICD-10-CM | POA: Diagnosis not present

## 2023-03-20 DIAGNOSIS — D485 Neoplasm of uncertain behavior of skin: Secondary | ICD-10-CM | POA: Diagnosis not present

## 2023-03-23 NOTE — Progress Notes (Deleted)
 Cardiology Office Note Date:  03/23/2023  Patient ID:  Sydney Anderson, Sydney Anderson April 01, 1950, MRN [id number] PCP:  Dale , MD  Cardiologist:  Yvonne Kendall, MD Electrophysiologist: Lanier Prude, MD    Chief Complaint: Afib follow-up  History of Present Illness: Sydney Anderson is a 73 y.o. female with PMH notable for afib w RVR, Aflutter, atach, HTN, hypothyroid, PACs; seen today for Lanier Prude, MD for routine electrophysiology followup.  Last saw Dr. Lalla Brothers 11/2020 for palpitation evaluation. Had worn a zio that showed SVT burden. He started her on propafenone. I last saw her 07/2022, where she was having paroxysms of AFib with controlled ventricular rates. Using Kardia[employer] to monitor.  On follow-up today, *** AF burden, symptoms *** palpitations *** bleeding concerns     She is diligently taking Eliquis twice daily, no bleeding concerns.    she denies dyspnea, PND, orthopnea, nausea, vomiting, dizziness, syncope, edema, weight gain, or early satiety.    AAD History: Propafenone, started 10/2020  ROS:  Please see the history of present illness. All other systems are reviewed and otherwise negative.   PHYSICAL EXAM:  VS:  There were no vitals taken for this visit. BMI: There is no height or weight on file to calculate BMI.  Wt Readings from Last 3 Encounters:  08/04/22 203 lb 6.4 oz (92.3 kg)  07/18/22 202 lb (91.6 kg)  07/07/22 202 lb (91.6 kg)    GEN- The patient is well appearing, alert and oriented x 3 today.   Lungs- Clear to ausculation bilaterally, normal work of breathing.  Heart- Regular rate and rhythm, no murmurs, rubs or gallops Extremities- No peripheral edema, warm, dry   EKG is ordered. Personal review of EKG from today shows: SB, rate 59bpm, LAD   Additional studies reviewed include: Previous EP, cardiology notes.   TTE, 07/29/2022  1. Left ventricular ejection fraction, by estimation, is 60 to 65%. The left  ventricle has normal function. The left ventricle has no regional wall motion abnormalities. Left ventricular diastolic parameters were normal. The average left ventricular  global longitudinal strain is -19.2 %.   2. Right ventricular systolic function is normal. The right ventricular size is normal. There is normal pulmonary artery systolic pressure. The estimated right ventricular systolic pressure is 29.2 mmHg.   3. The mitral valve is normal in structure. Mild mitral valve regurgitation. No evidence of mitral stenosis.   4. Tricuspid valve regurgitation is mild to moderate.   5. The aortic valve is tricuspid. Aortic valve regurgitation is not visualized. Aortic valve sclerosis is present, with no evidence of aortic valve stenosis.   6. There is borderline dilatation of the ascending aorta, measuring 36 mm.   7. The inferior vena cava is normal in size with greater than 50% respiratory variability, suggesting right atrial pressure of 3 mmHg.   NM myoview, 11/09/2020   The study is normal. The study is low risk.   No ST deviation was noted.   LV perfusion is normal. There is no evidence of ischemia. There is no evidence of infarction.   Left ventricular function is normal. End diastolic cavity size is normal. End systolic cavity size is normal.   EKG was not interpretable at peak stress due to significant motion artifact but no significant changes noted in recovery.   CT attenuation images showed no significant coronary calcifications.   TTE, 08/26/2020  1. Left ventricular ejection fraction, by estimation, is 65 to 70%. The left ventricle has normal function. The  left ventricle has no regional wall motion abnormalities. Left ventricular diastolic parameters were normal.   2. Right ventricular systolic function is normal. The right ventricular size is normal.   3. The mitral valve is normal in structure. No evidence of mitral valve regurgitation.   4. The aortic valve was not well visualized.  Aortic valve regurgitation is not visualized.   5. The inferior vena cava is normal in size with greater than 50% respiratory variability, suggesting right atrial pressure of 3 mmHg.    Long term monitor, 08/06/2020 The patient was monitored for 14 days. The predominant rhythm was sinus with an average rate of 66 bpm (range 43-120 bpm and sinus). There were occasional PACs and rare PVCs. 483 atrial runs occurred, lasting up to 17.9 seconds with a maximum rate of 182 bpm. There was no sustained arrhythmia or prolonged pause. Patient triggered events corresponded to normal sinus rhythm, PACs, and PVCs.   ASSESSMENT AND PLAN:  #) palpitations #) SVT #) parox AFlutter Maintaining sinus on propafenone   #) Hypercoag d/t Aflutter CHA2DS2-VASc Score = 3 [CHF History: 0, HTN History: 1, Diabetes History: 0, Stroke History: 0, Vascular Disease History: 0, Age Score: 1, Gender Score: 1].  Therefore, the patient's annual risk of stroke is 3.2 %.  NOAC - 5 mg Eliquis twice daily, appropriately dosed No bleeding concerns   Current medicines are reviewed at length with the patient today.   The patient has concerns regarding her medicines.  The following changes were made today:     Labs/ tests ordered today include:  No orders of the defined types were placed in this encounter.    Disposition: Follow up with EP APP in in 3 months   Signed, Sherie Don, NP  03/23/23  10:58 AM  Electrophysiology [facility]

## 2023-03-24 ENCOUNTER — Ambulatory Visit: Payer: Medicare Other | Admitting: Cardiology

## 2023-03-30 NOTE — Progress Notes (Deleted)
 Cardiology Office Note Date:  03/30/2023  Patient ID:  Sydney, Anderson 03-28-1950, MRN [id number] PCP:  Dale Benton, MD  Cardiologist:  Yvonne Kendall, MD Electrophysiologist: Lanier Prude, MD    Chief Complaint: Afib follow-up  History of Present Illness: Sydney Anderson is a 73 y.o. female with PMH notable for afib w RVR, Aflutter, atach, HTN, hypothyroid, PACs; seen today for Lanier Prude, MD for routine electrophysiology followup.  Last saw Dr. Lalla Brothers 11/2020 for palpitation evaluation. Had worn a zio that showed SVT burden. He started her on propafenone. I last saw her 07/2022, where she was having paroxysms of AFib with controlled ventricular rates. Using Kardia[employer] to monitor.  On follow-up today, *** AF burden, symptoms *** palpitations *** bleeding concerns     She is diligently taking Eliquis twice daily, no bleeding concerns.    she denies dyspnea, PND, orthopnea, nausea, vomiting, dizziness, syncope, edema, weight gain, or early satiety.    AAD History: Propafenone, started 10/2020  ROS:  Please see the history of present illness. All other systems are reviewed and otherwise negative.   PHYSICAL EXAM:  VS:  There were no vitals taken for this visit. BMI: There is no height or weight on file to calculate BMI.  Wt Readings from Last 3 Encounters:  08/04/22 203 lb 6.4 oz (92.3 kg)  07/18/22 202 lb (91.6 kg)  07/07/22 202 lb (91.6 kg)    GEN- The patient is well appearing, alert and oriented x 3 today.   Lungs- Clear to ausculation bilaterally, normal work of breathing.  Heart- Regular rate and rhythm, no murmurs, rubs or gallops Extremities- No peripheral edema, warm, dry   EKG is ordered. Personal review of EKG from today shows: SB, rate 59bpm, LAD   Additional studies reviewed include: Previous EP, cardiology notes.   TTE, 07/29/2022  1. Left ventricular ejection fraction, by estimation, is 60 to 65%. The left  ventricle has normal function. The left ventricle has no regional wall motion abnormalities. Left ventricular diastolic parameters were normal. The average left ventricular  global longitudinal strain is -19.2 %.   2. Right ventricular systolic function is normal. The right ventricular size is normal. There is normal pulmonary artery systolic pressure. The estimated right ventricular systolic pressure is 29.2 mmHg.   3. The mitral valve is normal in structure. Mild mitral valve regurgitation. No evidence of mitral stenosis.   4. Tricuspid valve regurgitation is mild to moderate.   5. The aortic valve is tricuspid. Aortic valve regurgitation is not visualized. Aortic valve sclerosis is present, with no evidence of aortic valve stenosis.   6. There is borderline dilatation of the ascending aorta, measuring 36 mm.   7. The inferior vena cava is normal in size with greater than 50% respiratory variability, suggesting right atrial pressure of 3 mmHg.   NM myoview, 11/09/2020   The study is normal. The study is low risk.   No ST deviation was noted.   LV perfusion is normal. There is no evidence of ischemia. There is no evidence of infarction.   Left ventricular function is normal. End diastolic cavity size is normal. End systolic cavity size is normal.   EKG was not interpretable at peak stress due to significant motion artifact but no significant changes noted in recovery.   CT attenuation images showed no significant coronary calcifications.   TTE, 08/26/2020  1. Left ventricular ejection fraction, by estimation, is 65 to 70%. The left ventricle has normal function. The  left ventricle has no regional wall motion abnormalities. Left ventricular diastolic parameters were normal.   2. Right ventricular systolic function is normal. The right ventricular size is normal.   3. The mitral valve is normal in structure. No evidence of mitral valve regurgitation.   4. The aortic valve was not well visualized.  Aortic valve regurgitation is not visualized.   5. The inferior vena cava is normal in size with greater than 50% respiratory variability, suggesting right atrial pressure of 3 mmHg.    Long term monitor, 08/06/2020 The patient was monitored for 14 days. The predominant rhythm was sinus with an average rate of 66 bpm (range 43-120 bpm and sinus). There were occasional PACs and rare PVCs. 483 atrial runs occurred, lasting up to 17.9 seconds with a maximum rate of 182 bpm. There was no sustained arrhythmia or prolonged pause. Patient triggered events corresponded to normal sinus rhythm, PACs, and PVCs.   ASSESSMENT AND PLAN:  #) palpitations #) SVT #) parox AFlutter Maintaining sinus on propafenone   #) Hypercoag d/t Aflutter CHA2DS2-VASc Score = 3 [CHF History: 0, HTN History: 1, Diabetes History: 0, Stroke History: 0, Vascular Disease History: 0, Age Score: 1, Gender Score: 1].  Therefore, the patient's annual risk of stroke is 3.2 %.  NOAC - 5 mg Eliquis twice daily, appropriately dosed No bleeding concerns   Current medicines are reviewed at length with the patient today.   The patient has concerns regarding her medicines.  The following changes were made today:     Labs/ tests ordered today include:  No orders of the defined types were placed in this encounter.    Disposition: Follow up with EP APP in in 3 months   Signed, Sherie Don, NP  03/30/23  2:23 PM  Electrophysiology [facility]

## 2023-04-03 ENCOUNTER — Ambulatory Visit: Payer: Medicare Other | Admitting: Cardiology

## 2023-04-04 ENCOUNTER — Telehealth: Payer: Self-pay | Admitting: *Deleted

## 2023-04-04 ENCOUNTER — Encounter: Payer: Self-pay | Admitting: *Deleted

## 2023-04-04 ENCOUNTER — Telehealth: Payer: Self-pay | Admitting: Internal Medicine

## 2023-04-04 NOTE — Telephone Encounter (Signed)
 Letter drafted and signed by Sherie Don, NP.   Copy of the letter has been emailed to HIM for processing at  [email or web address]

## 2023-04-04 NOTE — Telephone Encounter (Signed)
-----   Message from [place] sent at 04/03/2023  3:32 PM EST ----- Regarding: RE: multiple same-day cancellations Please start discharge process ----- Message ----- From: Jefferey Pica, RN Sent: 04/03/2023   3:10 PM EST To: Darene Lamer, LPN; Sherie Don, NP Subject: RE: multiple same-day cancellations            So do you want to try to connect with her daughter or do you want me to start with the discharge process? ----- Message ----- From: Sherie Don, NP Sent: 04/03/2023   8:17 AM EST To: Jefferey Pica, RN; Darene Lamer, LPN Subject: RE: multiple same-day cancellations            She has now same-day cancelled several more times.   Yes, please move forward with discharging her ----- Message ----- From: Jefferey Pica, RN Sent: 03/16/2023   3:43 PM EST To: Darene Lamer, LPN; Sherie Don, NP Subject: FW: multiple same-day cancellations            I spoke with Lawanna Kobus- she advised there if no real formal process for this but 1 of 2 things:  - The patient should be getting/ will start receiving no show or late cancellation notifications - You can discharge the patient at your discretion from the practice due to chronic no shows/ cancellations ----- Message ----- From: Sherie Don, NP Sent: 03/07/2023   9:10 AM EST To: Jefferey Pica, RN Subject: multiple same-day cancellations                Hi - this patient has same-day cancelled many times - I think I counted 4 times.   I'm ok with her continuing to be scheduled with me, but is there an official warning we can send her? If she's going to cancel, just needs to not be the same day!

## 2023-04-04 NOTE — Telephone Encounter (Signed)
 Copied from CRM [id number]. Topic: Medicare AWV >> Apr 04, 2023 12:58 PM Payton Doughty wrote: Reason for CRM: Called LVM 04/04/2023 to schedule AWV. Please schedule office or virtual visits.  Verlee Rossetti; Care Guide Ambulatory Clinical Support [facility] Direct Dial: [phone]

## 2023-04-05 NOTE — Telephone Encounter (Signed)
 We find it necessary to inforn you that [facility] and all providers practicing at this clinic will no longer be able to provide medical care to you due to chronic same day appointment cancellations.  04/04/23

## 2023-04-26 ENCOUNTER — Ambulatory Visit: Payer: Medicare Other | Attending: Cardiology | Admitting: Cardiology

## 2023-04-26 VITALS — BP 127/85 | HR 76 | Ht 67.0 in | Wt 205.6 lb

## 2023-04-26 DIAGNOSIS — D6869 Other thrombophilia: Secondary | ICD-10-CM

## 2023-04-26 DIAGNOSIS — I4891 Unspecified atrial fibrillation: Secondary | ICD-10-CM | POA: Diagnosis not present

## 2023-04-26 DIAGNOSIS — I483 Typical atrial flutter: Secondary | ICD-10-CM

## 2023-04-26 MED ORDER — APIXABAN 5 MG PO TABS: 5.0000 mg | ORAL_TABLET | Freq: Two times a day (BID) | ORAL | 5 refills | Status: DC

## 2023-04-26 MED ORDER — PROPAFENONE HCL 225 MG PO TABS: ORAL_TABLET | ORAL | 2 refills | Status: AC

## 2023-04-26 NOTE — Progress Notes (Signed)
 Cardiology Office Note Date:  04/26/2023  Patient ID:  Sydney Anderson, Sydney Anderson 1950/12/24, MRN [id number] PCP:  Dale Draper, MD  Cardiologist:  Yvonne Kendall, MD Electrophysiologist: Lanier Prude, MD    Chief Complaint: Afib follow-up  History of Present Illness: Sydney Anderson is a 73 y.o. female with PMH notable for afib w RVR, Aflutter, atach, HTN, hypothyroid, PACs; seen today for Lanier Prude, MD for routine electrophysiology followup.  Last saw Dr. Lalla Brothers 11/2020 for palpitation evaluation. Had worn a zio that showed SVT burden. He started her on propafenone. I last saw her 07/2022, where she was having paroxysms of AFib with controlled ventricular rates. Using K[employer]ia[employer] to monitor.  She has cancelled numerous follow-up appts.   On follow-up today, she is overall doing well from a cardiac standpoint. She had one 45 minute long palpitation episodes several months ago, but otherwise has not had any AF/flutter episodes. No chest pain or chest pressure, she did have some SOB with the episode.    She is diligently taking Eliquis twice daily, no bleeding concerns.    AAD History: Propafenone, started 10/2020  ROS:  Please see the history of present illness. All other systems are reviewed and otherwise negative.   PHYSICAL EXAM:  VS:  BP 127/85 (BP Location: Left Arm, Patient Position: Sitting, Cuff Size: Normal)   Pulse 76   Ht 5\' 7"  (1.702 m)   Wt 205 lb 9.6 oz (93.3 kg)   SpO2 95%   BMI 32.20 kg/m  BMI: Body mass index is 32.2 kg/m.  Wt Readings from Last 3 Encounters:  04/26/23 205 lb 9.6 oz (93.3 kg)  08/04/22 203 lb 6.4 oz (92.3 kg)  07/18/22 202 lb (91.6 kg)    GEN- The patient is well appearing, alert and oriented x 3 today.   Lungs- Clear to ausculation bilaterally, normal work of breathing.  Heart- Regular rate and rhythm, no murmurs, rubs or gallops Extremities- No peripheral edema, warm, dry   EKG is ordered. Personal review  of EKG from today shows: EKG Interpretation Date/Time:  Wednesday April 26 2023 13:59:47 EDT Ventricular Rate:  76 PR Interval:  256 QRS Duration:  104 QT Interval:  416 QTC Calculation: 468 R Axis:   -84  Text Interpretation: Sinus rhythm with 1st degree A-V block with Premature atrial complexes Left axis deviation Pulmonary disease pattern Confirmed by Sherie Don [phone]) on 04/26/2023 2:08:12 PM     07/18/2022 - SB with PACs, rate 59bpm, LAD   Additional studies reviewed include: Previous EP, cardiology notes.   TTE, 07/29/2022  1. Left ventricular ejection fraction, by estimation, is 60 to 65%. The left ventricle has normal function. The left ventricle has no regional wall motion abnormalities. Left ventricular diastolic parameters were normal. The average left ventricular  global longitudinal strain is -19.2 %.   2. Right ventricular systolic function is normal. The right ventricular size is normal. There is normal pulmonary artery systolic pressure. The estimated right ventricular systolic pressure is 29.2 mmHg.   3. The mitral valve is normal in structure. Mild mitral valve regurgitation. No evidence of mitral stenosis.   4. Tricuspid valve regurgitation is mild to moderate.   5. The aortic valve is tricuspid. Aortic valve regurgitation is not visualized. Aortic valve sclerosis is present, with no evidence of aortic valve stenosis.   6. There is borderline dilatation of the ascending aorta, measuring 36 mm.   7. The inferior vena cava is normal in size with greater than  50% respiratory variability, suggesting right atrial pressure of 3 mmHg.   NM myoview, 11/09/2020   The study is normal. The study is low risk.   No ST deviation was noted.   LV perfusion is normal. There is no evidence of ischemia. There is no evidence of infarction.   Left ventricular function is normal. End diastolic cavity size is normal. End systolic cavity size is normal.   EKG was not interpretable at  peak stress due to significant motion artifact but no significant changes noted in recovery.   CT attenuation images showed no significant coronary calcifications.   TTE, 08/26/2020  1. Left ventricular ejection fraction, by estimation, is 65 to 70%. The left ventricle has normal function. The left ventricle has no regional wall motion abnormalities. Left ventricular diastolic parameters were normal.   2. Right ventricular systolic function is normal. The right ventricular size is normal.   3. The mitral valve is normal in structure. No evidence of mitral valve regurgitation.   4. The aortic valve was not well visualized. Aortic valve regurgitation is not visualized.   5. The inferior vena cava is normal in size with greater than 50% respiratory variability, suggesting right atrial pressure of 3 mmHg.    Long term monitor, 08/06/2020 The patient was monitored for 14 days. The predominant rhythm was sinus with an average rate of 66 bpm (range 43-120 bpm and sinus). There were occasional PACs and rare PVCs. 483 atrial runs occurred, lasting up to 17.9 seconds with a maximum rate of 182 bpm. There was no sustained arrhythmia or prolonged pause. Patient triggered events corresponded to normal sinus rhythm, PACs, and PVCs.   ASSESSMENT AND PLAN:  #) Afib #) SVT #) parox AFlutter Maintaining sinus on propafenone with one brief AFib episode since last being seen EKG with stable intervals Continue propafenone 225mg  BID Update BMP, mag today   #) Hypercoag d/t AFib, Aflutter CHA2DS2-VASc Score = 3 [CHF History: 0, HTN History: 1, Diabetes History: 0, Stroke History: 0, Vascular Disease History: 0, Age Score: 1, Gender Score: 1].  Therefore, the patient's annual risk of stroke is 3.2 %.  NOAC - 5 mg Eliquis twice daily, appropriately dosed No bleeding concerns Update CBC      Current medicines are reviewed at length with the patient today.   The patient has concerns regarding her  medicines.  The following changes were made today:   none  Labs/ tests ordered today include:  Orders Placed This Encounter  Procedures   Basic metabolic panel   Magnesium   CBC   EKG 12-Lead     Disposition: Follow up with Dr. Lalla Brothers or EP APP in  4 months    Signed, Sherie Don, NP  04/26/23  3:29 PM  Electrophysiology [facility]

## 2023-04-26 NOTE — Patient Instructions (Signed)
 Medication Instructions:  The current medical regimen is effective;  continue present plan and medications as directed. Please refer to the Current Medication list given to you today.   *If you need a refill on your cardiac medications before your next appointment, please call your pharmacy*   Lab Work: Your provider would like for you to have following labs drawn today BMET, MAG, CBC.   If you have labs (blood work) drawn today and your tests are completely normal, you will receive your results only by: MyChart Message (if you have MyChart) OR A paper copy in the mail If you have any lab test that is abnormal or we need to change your treatment, we will call you to review the results.   Follow-Up: At [facility], you and your health needs are our priority.  As part of our continuing mission to provide you with exceptional heart care, we have created designated Provider Care Teams.  These Care Teams include your primary Cardiologist (physician) and Advanced Practice Providers (APPs -  Physician Assistants and Nurse Practitioners) who all work together to provide you with the care you need, when you need it.  We recommend signing up for the patient portal called "MyChart".  Sign up information is provided on this After Visit Summary.  MyChart is used to connect with patients for Virtual Visits (Telemedicine).  Patients are able to view lab/test results, encounter notes, upcoming appointments, etc.  Non-urgent messages can be sent to your provider as well.   To learn more about what you can do with MyChart, go to [email or web address].    Your next appointment:   4 month(s)  Provider:   Steffanie Dunn, MD or Sherie Don, NP

## 2023-04-27 LAB — CBC
Hematocrit: 46.1 % (ref 34.0–46.6)
Hemoglobin: 15.7 g/dL (ref 11.1–15.9)
MCH: 32.1 pg (ref 26.6–33.0)
MCHC: 34.1 g/dL (ref 31.5–35.7)
MCV: 94 fL (ref 79–97)
Platelets: 252 10*3/uL (ref 150–450)
RBC: 4.89 x10E6/uL (ref 3.77–5.28)
RDW: 12.5 % (ref 11.7–15.4)
WBC: 8.1 10*3/uL (ref 3.4–10.8)

## 2023-04-27 LAB — BASIC METABOLIC PANEL
BUN/Creatinine Ratio: 13 (ref 12–28)
BUN: 11 mg/dL (ref 8–27)
CO2: 21 mmol/L (ref 20–29)
Calcium: 9.1 mg/dL (ref 8.7–10.3)
Chloride: 102 mmol/L (ref 96–106)
Creatinine, Ser: 0.83 mg/dL (ref 0.57–1.00)
Glucose: 87 mg/dL (ref 70–99)
Potassium: 4.7 mmol/L (ref 3.5–5.2)
Sodium: 139 mmol/L (ref 134–144)
eGFR: 75 mL/min/{1.73_m2} (ref >59)

## 2023-04-27 LAB — MAGNESIUM: Magnesium: 2.2 mg/dL (ref 1.6–2.3)

## 2023-05-05 ENCOUNTER — Encounter

## 2023-05-12 ENCOUNTER — Ambulatory Visit: Payer: Medicare Other | Admitting: Internal Medicine

## 2023-05-18 ENCOUNTER — Telehealth: Payer: Self-pay | Admitting: Internal Medicine

## 2023-05-18 NOTE — Telephone Encounter (Signed)
 Lm letting patient know that her 06/01/2023 appointment has been moved up to the 23rd. If patient calls and can not do this time, E2C2 please reschedule to next available.

## 2023-05-26 ENCOUNTER — Encounter

## 2023-05-31 ENCOUNTER — Ambulatory Visit: Admitting: Internal Medicine

## 2023-06-01 ENCOUNTER — Ambulatory Visit: Admitting: Internal Medicine

## 2023-06-08 ENCOUNTER — Other Ambulatory Visit: Payer: Self-pay | Admitting: Internal Medicine

## 2023-06-14 ENCOUNTER — Encounter (HOSPITAL_COMMUNITY): Payer: Self-pay

## 2023-06-21 ENCOUNTER — Encounter

## 2023-06-28 ENCOUNTER — Other Ambulatory Visit: Payer: Self-pay | Admitting: Medical Genetics

## 2023-07-05 ENCOUNTER — Encounter

## 2023-07-20 ENCOUNTER — Other Ambulatory Visit: Payer: Self-pay | Admitting: Internal Medicine

## 2023-07-20 NOTE — Telephone Encounter (Signed)
 She has been on prescription vitamin D  for the last 6 months. She has an upcoming appt with me this month. Remain off prescription vitamin D  for now. Will need a f/u vitamin d  level check with next labs and then can determine if prescription vitamin D  is still needed.  Can start vitamin D3 1000 units q day. This is over the counter vitamin d .

## 2023-07-27 ENCOUNTER — Encounter

## 2023-08-01 ENCOUNTER — Encounter: Admitting: Internal Medicine

## 2023-08-07 ENCOUNTER — Telehealth: Payer: Self-pay | Admitting: *Deleted

## 2023-08-07 ENCOUNTER — Ambulatory Visit (INDEPENDENT_AMBULATORY_CARE_PROVIDER_SITE_OTHER): Admitting: *Deleted

## 2023-08-07 VITALS — Ht 67.0 in | Wt 200.0 lb

## 2023-08-07 DIAGNOSIS — Z0000 Encounter for general adult medical examination without abnormal findings: Secondary | ICD-10-CM

## 2023-08-07 DIAGNOSIS — Z1231 Encounter for screening mammogram for malignant neoplasm of breast: Secondary | ICD-10-CM | POA: Diagnosis not present

## 2023-08-07 DIAGNOSIS — Z780 Asymptomatic menopausal state: Secondary | ICD-10-CM | POA: Diagnosis not present

## 2023-08-07 NOTE — Telephone Encounter (Signed)
 Performed AWV Refer to Depression screening done during the visit. Patient stated that she feels depressed every day. Patient was offered an appointment which she declined stating that she will discuss this with you at her visit 10/17/23. Patient stated that she would not do any harm to herself and does not want to take any medication for her depression.  Patient stated that she is also dealing with knee pain and will discuss that with you at the visit in September.

## 2023-08-07 NOTE — Telephone Encounter (Signed)
 LM for patient

## 2023-08-07 NOTE — Progress Notes (Signed)
 Subjective:   Sydney Anderson is a 73 y.o. who presents for a Medicare Wellness preventive visit.  As a reminder, Annual Wellness Visits don't include a physical exam, and some assessments may be limited, especially if this visit is performed virtually. We may recommend an in-person follow-up visit with your provider if needed.  Visit Complete: Virtual I connected with  [name] on 08/07/23 by a audio enabled telemedicine application and verified that I am speaking with the correct person using two identifiers.  Patient Location: Home  Provider Location: Home Office  I discussed the limitations of evaluation and management by telemedicine. The patient expressed understanding and agreed to proceed.  Vital Signs: Because this visit was a virtual/telehealth visit, some criteria may be missing or patient reported. Any vitals not documented were not able to be obtained and vitals that have been documented are patient reported.  VideoDeclined- This patient declined Librarian, academic. Therefore the visit was completed with audio only.  Persons Participating in Visit: Patient.  AWV Questionnaire: Yes: Patient Medicare AWV questionnaire was completed by the patient on 08/04/23; I have confirmed that all information answered by patient is correct and no changes since this date.  Cardiac Risk Factors include: advanced age (>69men, >72 women);dyslipidemia;obesity (BMI >30kg/m2);Other (see comment), Risk factor comments: Atrial flutter     Objective:    Today's Vitals   08/07/23 1137 08/07/23 1138  Weight: 200 lb (90.7 kg)   Height: 5' 7 (1.702 m)   PainSc:  2    Body mass index is 31.32 kg/m.     08/07/2023   12:00 PM 06/27/2022    8:05 AM  Advanced Directives  Does Patient Have a Medical Advance Directive? No No  Would patient like information on creating a medical advance directive? No - Patient declined No - Patient declined     Current Medications (verified) Outpatient Encounter Medications as of 08/07/2023  Medication Sig   acetaminophen (TYLENOL) 500 MG tablet Take 500 mg by mouth every 6 (six) hours as needed.   apixaban  (ELIQUIS ) 5 MG TABS tablet Take 1 tablet (5 mg total) by mouth 2 (two) times daily.   cholecalciferol (VITAMIN D3) 25 MCG (1000 UNIT) tablet Take 1,000 Units by mouth daily.   FLUoxetine  (PROZAC ) 20 MG capsule TAKE 1 CAPSULE(20 MG) BY MOUTH DAILY   levothyroxine  (SYNTHROID ) 75 MCG tablet TAKE 1 TABLET(75 MCG) BY MOUTH DAILY   methylcellulose (ARTIFICIAL TEARS) 1 % ophthalmic solution Place 1 drop into both eyes as needed.   propafenone  (RYTHMOL ) 225 MG tablet TAKE 1 TABLET BY MOUTH IN THE MORNING AND 1 IN THE EVENING   sodium chloride (OCEAN) 0.65 % nasal spray Place 1 spray into the nose as needed.   Timolol Maleate 0.5 % (DAILY) SOLN Place into the right eye daily.   VOLTAREN 1 % GEL Apply topically as needed.   prednisoLONE acetate (PRED FORTE) 1 % ophthalmic suspension APPLY 1 DROP TO THE RIGHT EYE 4 TIMES A DAY STARTING 2 DAYS BEFORE LASER TREATMENT (Patient not taking: Reported on 08/07/2023)   Vitamin D , Ergocalciferol , (DRISDOL ) 1.25 MG (50000 UNIT) CAPS capsule TAKE 1 CAPSULE BY MOUTH EVERY 7 DAYS (Patient not taking: Reported on 08/07/2023)   No facility-administered encounter medications on file as of 08/07/2023.    Allergies (verified) Horse-derived products and Tetanus toxoids   History: Past Medical History:  Diagnosis Date   Arthritis    Asthma    Atrial tachycardia (HCC)    Depression  Diverticulitis    GERD (gastroesophageal reflux disease)    Glaucoma    Hypertension    No issues since quit smoking   Hypothyroidism    Kidney stones    Migraines    None for several years   Myopia of both eyes    Spinal stenosis    Past Surgical History:  Procedure Laterality Date   accident  2005   CATARACT EXTRACTION W/PHACO Left 06/27/2022   Procedure: CATARACT  EXTRACTION PHACO AND INTRAOCULAR LENS PLACEMENT (IOC) LEFT 12.27 00:54.8;  Surgeon: Myrna Adine Anes, MD;  Location: [facility];  Service: Ophthalmology;  Laterality: Left;   EYE SURGERY  2010,2011   FOOT SURGERY  2006   kidney stones  1980   SHOULDER ADHESION RELEASE  2008   Family History  Problem Relation Age of Onset   Arthritis Mother    Lung cancer Mother    Mental illness Mother    Colon cancer Father    Mental illness Father    Heart attack Brother    Stroke Maternal Aunt    Heart disease Maternal Aunt    Heart disease Maternal Grandfather        Pacemaker   Stroke Maternal Grandfather    Breast cancer Paternal Grandmother 60   Social History   Socioeconomic History   Marital status: Married    Spouse name: Not on file   Number of children: Not on file   Years of education: Not on file   Highest education level: Master's degree (e.g., MA, MS, MEng, MEd, MSW, MBA)  Occupational History   Not on file  Tobacco Use   Smoking status: Former    Current packs/day: 0.00    Types: Cigarettes    Quit date: 2009    Years since quitting: 16.5   Smokeless tobacco: Never  Vaping Use   Vaping status: Never Used  Substance and Sexual Activity   Alcohol use: Yes    Alcohol/week: 15.0 standard drinks of alcohol    Types: 15 Standard drinks or equivalent per week    Comment: hard cider   Drug use: No   Sexual activity: Not on file  Other Topics Concern   Not on file  Social History Narrative   married   Social Drivers of Health   Financial Resource Strain: Medium Risk (08/04/2023)   Overall Financial Resource Strain (CARDIA)    Difficulty of Paying Living Expenses: Somewhat hard  Food Insecurity: No Food Insecurity (08/04/2023)   Hunger Vital Sign    Worried About Running Out of Food in the Last Year: Never true    Ran Out of Food in the Last Year: Never true  Transportation Needs: No Transportation Needs (08/04/2023)   PRAPARE - Scientist, research (physical sciences) (Medical): No    Lack of Transportation (Non-Medical): No  Physical Activity: Insufficiently Active (08/07/2023)   Exercise Vital Sign    Days of Exercise per Week: 3 days    Minutes of Exercise per Session: 30 min  Stress: Stress Concern Present (08/04/2023)   [employer] of Occupational Health - Occupational Stress Questionnaire    Feeling of Stress: Very much  Social Connections: Moderately Isolated (08/04/2023)   Social Connection and Isolation Panel    Frequency of Communication with Friends and Family: Never    Frequency of Social Gatherings with Friends and Family: Never    Attends Religious Services: Never    Database administrator or Organizations: Yes    Attends  Engineer, structural: More than 4 times per year    Marital Status: Married    Tobacco Counseling Counseling given: Not Answered    Clinical Intake:  Pre-visit preparation completed: Yes  Pain : 0-10 Pain Score: 2  Pain Type: Chronic pain Pain Location: Knee Pain Descriptors / Indicators: Aching Pain Onset: More than a month ago Pain Frequency: Intermittent     BMI - recorded: 31.32 Nutritional Status: BMI > 30  Obese Nutritional Risks: None Diabetes: No  No results found for: HGBA1C   How often do you need to have someone help you when you read instructions, pamphlets, or other written materials from your doctor or pharmacy?: 1 - Never  Interpreter Needed?: No  Information entered by :: R. Zamere Pasternak LPN   Activities of Daily Living     08/07/2023   11:40 AM  In your present state of health, do you have any difficulty performing the following activities:  Hearing? 0  Vision? 0  Comment glasses, blind in right eye  Difficulty concentrating or making decisions? 0  Walking or climbing stairs? 1  Dressing or bathing? 0  Doing errands, shopping? 0  Preparing Food and eating ? N  Using the Toilet? N  In the past six months, have you accidently leaked urine? Y  Do  you have problems with loss of bowel control? Y  Managing your Medications? N  Managing your Finances? N  Housekeeping or managing your Housekeeping? N    Patient Care Team: Glendia Shad, MD as PCP - General (Internal Medicine) End, Lonni, MD as PCP - Cardiology (Cardiology) Cindie Ole DASEN, MD as PCP - Electrophysiology (Cardiology)  I have updated your Care Teams any recent Medical Services you may have received from other providers in the past year.     Assessment:   This is a routine wellness examination for Ludie.  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 1 1 0 0 1   Number falls in past yr: 0 1  0 0   Injury with Fall? 1 1  0 0  Comment injrured knee      Risk for fall due to : History of fall(s);Impaired balance/gait History of fall(s) No Fall Risks Impaired balance/gait   Risk for fall due to: Comment slipped and fell      Follow up Falls evaluation completed;Falls prevention discussed Falls evaluation completed  Falls evaluation completed  Falls evaluation completed  Falls evaluation completed  Data saved with a previous flowsheet row definition    MEDICARE RISK AT HOME:  Medicare Risk at Home Any stairs in or around the home?: Yes If so, are there any without handrails?: No Home free of loose throw rugs in  walkways, pet beds, electrical cords, etc?: Yes Adequate lighting in your home to reduce risk of falls?: Yes Life alert?: No Use of a cane, walker or w/c?: Yes Grab bars in the bathroom?: Yes Shower chair or bench in shower?: Yes Elevated toilet seat or a handicapped toilet?: Yes  TIMED UP AND GO:  Was the test performed?  No  Cognitive Function: 6CIT completed        08/07/2023   12:00 PM  6CIT Screen  What Year? 0 points  What month? 0 points  What time? 0 points  Count back from 20 0 points  Months in reverse 0 points  Repeat phrase 2 points  Total Score 2 points    Immunizations Immunization History  Administered Date(s) Administered   Influenza Split 11/12/2012, 11/21/2013   Influenza, High Dose Seasonal PF 12/03/2018   Influenza-Unspecified 12/02/2014, 11/23/2017, 11/11/2019   PFIZER(Purple Top)SARS-COV-2 Vaccination 03/23/2019, 04/16/2019, 11/19/2019   Pneumococcal Conjugate-13 07/31/2018   Pneumococcal Polysaccharide-23 07/30/2020   Tdap 03/20/2015   Zoster Recombinant(Shingrix) 05/24/2017, 08/31/2017   Zoster, Live 12/25/2013    Screening Tests Health Maintenance  Topic Date Due   Colonoscopy  04/16/2014   MAMMOGRAM  06/08/2021   COVID-19 Vaccine (4 - 2024-25 season) 10/09/2022   INFLUENZA VACCINE  09/08/2023   Medicare Annual Wellness (AWV)  08/06/2024   DTaP/Tdap/Td (2 - Td or Tdap) 03/19/2025   Pneumococcal Vaccine: 50+ Years  Completed   DEXA SCAN  Completed   Hepatitis C Screening  Completed   Zoster Vaccines- Shingrix  Completed   Hepatitis B Vaccines  Aged Out   HPV VACCINES  Aged Out   Meningococcal B Vaccine  Aged Out   Lung Cancer Screening  Discontinued    Health Maintenance  Health Maintenance Due  Topic Date Due   Colonoscopy  04/16/2014   MAMMOGRAM  06/08/2021   COVID-19 Vaccine (4 - 2024-25 season) 10/09/2022   Health Maintenance Items Addressed: Mammogram ordered, DEXA ordered, Patient is scheduled for a colonoscopy  constult 11/06/23  Additional Screening:  Vision Screening: Recommended annual ophthalmology exams for early detection of glaucoma and other disorders of the eye. UP to date [facility] Would you like a referral to an eye doctor? No    Dental Screening: Recommended annual dental exams for proper oral hygiene  Community Resource Referral / Chronic Care Management: CRR required this visit?  No   CCM required this visit?  No   Plan:    I have personally reviewed and noted the following in the patient's chart:   Medical and social history Use of alcohol, tobacco or illicit drugs  Current medications and supplements including opioid prescriptions. Patient is not currently taking opioid prescriptions. Functional ability and status Nutritional status Physical activity Advanced directives List of other physicians Hospitalizations, surgeries, and ER visits in previous 12 months Vitals Screenings to include cognitive, depression, and falls Referrals and appointments  In addition, I have reviewed and discussed with patient certain preventive protocols, quality metrics, and best practice recommendations. A written personalized care plan for preventive services as well as general preventive health recommendations were provided to patient.   Angeline Fredericks, LPN   [id number]   After Visit Summary: (MyChart) Due to this being a telephonic visit, the  after visit summary with patients personalized plan was offered to patient via MyChart   Notes: Nothing significant to report at this time.  Phone note sent to PCP

## 2023-08-07 NOTE — Patient Instructions (Addendum)
 Sydney Anderson , Thank you for taking time out of your busy schedule to complete your Annual Wellness Visit with me. I enjoyed our conversation and look forward to speaking with you again next year. I, as well as your care team,  appreciate your ongoing commitment to your health goals. Please review the following plan we discussed and let me know if I can assist you in the future. Your Game plan/ To Do List    Referrals: If you haven't heard from the office you've been referred to, please reach out to them at the phone provided.   Mammogram and Dexa order has been placed.  You have an order for:  []   2D Mammogram  [x]   3D Mammogram  [x]   Bone Density     Please call for appointment:  [facility]  [address] [place] [address] [phone]    Make sure to wear two-piece clothing.  No lotions, powders, or deodorants the day of the appointment. Make sure to bring picture ID and insurance card.  Bring list of medications you are currently taking including any supplements.   Follow up Visits: Next Medicare AWV with our clinical staff: 08/12/24 @ 10:50   Have you seen your provider in the last 6 months (3 months if uncontrolled diabetes)? Yes Next Office Visit with your provider: 10/17/23  Clinician Recommendations:  Aim for 30 minutes of exercise or brisk walking, 6-8 glasses of water, and 5 servings of fruits and vegetables each day.       This is a list of the screening recommended for you and due dates:  Health Maintenance  Topic Date Due   Colon Cancer Screening  04/16/2014   Mammogram  06/08/2021   COVID-19 Vaccine (5 - 2024-25 season) 05/12/2023   Flu Shot  09/08/2023   Medicare Annual Wellness Visit  08/06/2024   DTaP/Tdap/Td vaccine (2 - Td or Tdap) 03/19/2025   Pneumococcal Vaccine for age over 53  Completed   DEXA scan (bone density measurement)  Completed   Hepatitis C Screening  Completed   Zoster (Shingles)  Vaccine  Completed   Hepatitis B Vaccine  Aged Out   HPV Vaccine  Aged Out   Meningitis B Vaccine  Aged Out   Screening for Lung Cancer  Discontinued    Advanced directives: (ACP Link)Information on Advanced Care Planning can be found at [place]  Secretary of [facility] Advance Health Care Directives Advance Health Care Directives. [email or web address]  Advance Care Planning is important because it:  [x]  Makes sure you receive the medical care that is consistent with your values, goals, and preferences  [x]  It provides guidance to your family and loved ones and reduces their decisional burden about whether or not they are making the right decisions based on your wishes.  Follow the link provided in your after visit summary or read over the paperwork we have mailed to you to help you started getting your Advance Directives in place. If you need assistance in completing these, please reach out to us  so that we can help you!

## 2023-08-07 NOTE — Telephone Encounter (Signed)
 Reviewed. Per note, no suicidal ideations. I can work her in earlier if needed.

## 2023-08-07 NOTE — Telephone Encounter (Signed)
 FYI for you. She cancelled last appointment.

## 2023-08-09 ENCOUNTER — Telehealth: Payer: Self-pay | Admitting: Internal Medicine

## 2023-08-09 NOTE — Telephone Encounter (Signed)
Received notification - overdue mammogram.  Please schedule.   

## 2023-08-09 NOTE — Telephone Encounter (Signed)
 LM for patient about scheduling her mammo

## 2023-08-09 NOTE — Telephone Encounter (Signed)
 LM for patient

## 2023-08-10 NOTE — Telephone Encounter (Signed)
 Mammo scheduled. Patient stated that she would look in her my chart for the date.

## 2023-08-10 NOTE — Telephone Encounter (Signed)
Declined earlier appt

## 2023-08-21 ENCOUNTER — Encounter

## 2023-08-27 NOTE — Progress Notes (Unsigned)
 This encounter was created in error - please disregard.

## 2023-08-28 ENCOUNTER — Encounter: Payer: Self-pay | Admitting: Cardiology

## 2023-08-28 ENCOUNTER — Ambulatory Visit: Admitting: Cardiology

## 2023-08-28 DIAGNOSIS — I483 Typical atrial flutter: Secondary | ICD-10-CM

## 2023-08-28 DIAGNOSIS — D6869 Other thrombophilia: Secondary | ICD-10-CM

## 2023-08-28 DIAGNOSIS — I4891 Unspecified atrial fibrillation: Secondary | ICD-10-CM

## 2023-08-28 NOTE — Telephone Encounter (Signed)
 Error

## 2023-08-30 ENCOUNTER — Encounter

## 2023-09-19 ENCOUNTER — Other Ambulatory Visit: Payer: Self-pay | Admitting: Internal Medicine

## 2023-09-19 MED ORDER — LEVOTHYROXINE SODIUM 75 MCG PO TABS: ORAL_TABLET | ORAL | 1 refills | Status: DC

## 2023-09-19 NOTE — Telephone Encounter (Signed)
 Copied from CRM [id number]. Topic: Clinical - Medication Refill >> Sep 19, 2023 11:07 AM Corin V wrote: Medication: levothyroxine  (SYNTHROID ) 75 MCG tablet  Has the patient contacted their pharmacy? Yes- no refills remaining (Agent: If no, request that the patient contact the pharmacy for the refill. If patient does not wish to contact the pharmacy document the reason why and proceed with request.) (Agent: If yes, when and what did the pharmacy advise?)  This is the patient's preferred pharmacy:  [id number] [place], [place] - [address] AT [facility] OF [address] [place] [address] Phone: [phone] Fax: [phone]  Is this the correct pharmacy for this prescription? Yes If no, delete pharmacy and type the correct one.   Has the prescription been filled recently? No  Is the patient out of the medication? No- 2 days left  Has the patient been seen for an appointment in the last year OR does the patient have an upcoming appointment? Yes  Can we respond through MyChart? Yes  Agent: Please be advised that Rx refills may take up to 3 business days. We ask that you follow-up with your pharmacy.

## 2023-10-03 ENCOUNTER — Encounter

## 2023-10-03 NOTE — Progress Notes (Unsigned)
 Electrophysiology Clinic Note    Date:  10/04/2023  Patient ID:  Geneve, Kimpel Dec 19, 1950, MRN [id number] PCP:  Glendia Shad, MD  Cardiologist:  Lonni Hanson, MD   Electrophysiologist:  OLE ONEIDA HOLTS, MD  Electrophysiology APP:  Oksana Deberry, NP   Discussed the use of AI scribe software for clinical note transcription with the patient, who gave verbal consent to proceed.   Patient Profile    Chief Complaint: AFib follow-up  History of Present Illness: Maniya Donovan is a 73 y.o. female with PMH notable for afib w RVR, Aflutter, atach, HTN, hypothyroid, PACs ; seen today for OLE ONEIDA HOLTS, MD for routine electrophysiology followup.   She last saw Dr. HOLTS 11/2020 for palpitation evaluation. Had worn a zio that showed SVT burden. He started her on propafenone . I saw her 07/2022, where she was having paroxysms of AFib with controlled ventricular rates. Using KardiaMobile to monitor.   She has cancelled numerous follow-up appts until 04/2023 where she was doing well with rare AF episodes  On follow-up today, she is doing very well without AF episodes. She denies chest pain, chest pressure, palpitations, SOB or increased edema. She continues to take propafenone  BID without off-target effects Continues to take eliquis  BID without bleeding concerns.  She checks BP regularly at home with home readings 110-120s. Her BP always rises at doctor appts.    Arrhythmia/Device History Propafenone     ROS:  Please see the history of present illness. All other systems are reviewed and otherwise negative.    Physical Exam    VS:  BP 136/84 (BP Location: Left Arm, Patient Position: Sitting, Cuff Size: Normal)   Pulse 70   Ht 5' 6 (1.676 m)   Wt 205 lb 6.4 oz (93.2 kg)   SpO2 97%   BMI 33.15 kg/m  BMI: Body mass index is 33.15 kg/m.      Wt Readings from Last 3 Encounters:  10/04/23 205 lb 6.4 oz (93.2 kg)  08/07/23 200 lb (90.7 kg)  04/26/23  205 lb 9.6 oz (93.3 kg)     GEN- The patient is well appearing, alert and oriented x 3 today.   Lungs- Clear to ausculation bilaterally, normal work of breathing.  Heart- Regular rate and rhythm, no murmurs, rubs or gallops Extremities- No peripheral edema, warm, dry   Studies Reviewed   Previous EP, cardiology notes.    EKG is ordered. Personal review of EKG from today shows:    EKG Interpretation Date/Time:  Wednesday October 04 2023 10:52:49 EDT Ventricular Rate:  70 PR Interval:  222 QRS Duration:  108 QT Interval:  430 QTC Calculation: 464 R Axis:   -89  Text Interpretation: Sinus rhythm with 1st degree A-V block Left axis deviation Low voltage QRS Confirmed by Kenai Fluegel [phone]) on 10/04/2023 11:05:31 AM     04/2023 EKG - SR w 1st deg AVB w PACs, LAD PR -  07/2022 EKG - SB w PACs, rate 59 PR -   TTE, 07/29/2022  1. Left ventricular ejection fraction, by estimation, is 60 to 65%. The left ventricle has normal function. The left ventricle has no regional wall motion abnormalities. Left ventricular diastolic parameters were normal. The average left ventricular global longitudinal strain is -19.2 %.   2. Right ventricular systolic function is normal. The right ventricular size is normal. There is normal pulmonary artery systolic pressure. The estimated right ventricular systolic pressure is 29.2 mmHg.   3. The mitral  valve is normal in structure. Mild mitral valve regurgitation. No evidence of mitral stenosis.   4. Tricuspid valve regurgitation is mild to moderate.   5. The aortic valve is tricuspid. Aortic valve regurgitation is not visualized. Aortic valve sclerosis is present, with no evidence of aortic valve stenosis.   6. There is borderline dilatation of the ascending aorta, measuring 36 mm.   7. The inferior vena cava is normal in size with greater than 50% respiratory variability, suggesting right atrial pressure of 3 mmHg.    NM myoview , 11/09/2020    The study is normal. The study is low risk.   No ST deviation was noted.   LV perfusion is normal. There is no evidence of ischemia. There is no evidence of infarction.   Left ventricular function is normal. End diastolic cavity size is normal. End systolic cavity size is normal.   EKG was not interpretable at peak stress due to significant motion artifact but no significant changes noted in recovery.   CT attenuation images showed no significant coronary calcifications.   TTE, 08/26/2020  1. Left ventricular ejection fraction, by estimation, is 65 to 70%. The left ventricle has normal function. The left ventricle has no regional wall motion abnormalities. Left ventricular diastolic parameters were normal.   2. Right ventricular systolic function is normal. The right ventricular size is normal.   3. The mitral valve is normal in structure. No evidence of mitral valve regurgitation.   4. The aortic valve was not well visualized. Aortic valve regurgitation is not visualized.   5. The inferior vena cava is normal in size with greater than 50% respiratory variability, suggesting right atrial pressure of 3 mmHg.    Long term monitor, 08/06/2020 The patient was monitored for 14 days. The predominant rhythm was sinus with an average rate of 66 bpm (range 43-120 bpm and sinus). There were occasional PACs and rare PVCs. 483 atrial runs occurred, lasting up to 17.9 seconds with a maximum rate of 182 bpm. There was no sustained arrhythmia or prolonged pause. Patient triggered events corresponded to normal sinus rhythm, PACs, and PVCs.     Assessment and Plan     #) Afib #) SVT #) parox AFlutter Maintaining sinus on propafenone  with one brief AFib episode since last being seen EKG with stable intervals Continue propafenone  225mg  BID Update BMP, mag today  #) Hypercoag d/t afib CHA2DS2-VASc Score = at least 3 [CHF History: 0, HTN History: 1, Diabetes History: 0, Stroke History: 0, Vascular  Disease History: 0, Age Score: 1, Gender Score: 1].  Therefore, the patient's annual risk of stroke is 3.2 %.    Stroke ppx - 5mg  eliquis  BID, appropriately dosed No bleeding concerns        Current medicines are reviewed at length with the patient today.   The patient does not have concerns regarding her medicines.  The following changes were made today:  none  Labs/ tests ordered today include:  Orders Placed This Encounter  Procedures   Basic metabolic panel with GFR   Magnesium   EKG 12-Lead     Disposition: Follow up with Dr. Cindie or EP APP in 6 months   Signed, Chantal Needle, NP  10/04/23  12:17 PM  Electrophysiology [facility]

## 2023-10-04 ENCOUNTER — Other Ambulatory Visit: Payer: Self-pay

## 2023-10-04 ENCOUNTER — Encounter: Payer: Self-pay | Admitting: Cardiology

## 2023-10-04 ENCOUNTER — Ambulatory Visit: Attending: Cardiology | Admitting: Cardiology

## 2023-10-04 VITALS — BP 136/84 | HR 70 | Ht 66.0 in | Wt 205.4 lb

## 2023-10-04 DIAGNOSIS — I483 Typical atrial flutter: Secondary | ICD-10-CM | POA: Diagnosis not present

## 2023-10-04 DIAGNOSIS — I4891 Unspecified atrial fibrillation: Secondary | ICD-10-CM | POA: Diagnosis not present

## 2023-10-04 DIAGNOSIS — D6869 Other thrombophilia: Secondary | ICD-10-CM

## 2023-10-04 DIAGNOSIS — Z79899 Other long term (current) drug therapy: Secondary | ICD-10-CM

## 2023-10-04 NOTE — Patient Instructions (Signed)
 Medication Instructions:  Your physician recommends that you continue on your current medications as directed. Please refer to the Current Medication list given to you today.  *If you need a refill on your cardiac medications before your next appointment, please call your pharmacy*  Lab Work: TODAY: BMET and Mag  Follow-Up: At [facility], you and your health needs are our priority.  As part of our continuing mission to provide you with exceptional heart care, our providers are all part of one team.  This team includes your primary Cardiologist (physician) and Advanced Practice Providers or APPs (Physician Assistants and Nurse Practitioners) who all work together to provide you with the care you need, when you need it.  Your next appointment:   6 months  Provider:   You may see OLE ONEIDA HOLTS, MD or one of the following Advanced Practice Providers on your designated Care Team:   Charlies Arthur, [place] Jodie Passey, PA-C Suzann Riddle, NP Daphne Barrack, NP

## 2023-10-05 ENCOUNTER — Ambulatory Visit: Payer: Self-pay | Admitting: Cardiology

## 2023-10-05 LAB — BASIC METABOLIC PANEL WITH GFR
BUN/Creatinine Ratio: 12 (ref 12–28)
BUN: 10 mg/dL (ref 8–27)
CO2: 18 mmol/L — ABNORMAL LOW (ref 20–29)
Calcium: 9.2 mg/dL (ref 8.7–10.3)
Chloride: 102 mmol/L (ref 96–106)
Creatinine, Ser: 0.81 mg/dL (ref 0.57–1.00)
Glucose: 91 mg/dL (ref 70–99)
Potassium: 4.5 mmol/L (ref 3.5–5.2)
Sodium: 138 mmol/L (ref 134–144)
eGFR: 78 mL/min/1.73 (ref >59)

## 2023-10-05 LAB — MAGNESIUM: Magnesium: 2.1 mg/dL (ref 1.6–2.3)

## 2023-10-05 NOTE — Telephone Encounter (Signed)
 Left voicemail message to call back

## 2023-10-17 ENCOUNTER — Encounter: Admitting: Internal Medicine

## 2023-10-27 ENCOUNTER — Encounter

## 2023-11-06 ENCOUNTER — Other Ambulatory Visit: Payer: Self-pay | Admitting: *Deleted

## 2023-11-06 DIAGNOSIS — I4891 Unspecified atrial fibrillation: Secondary | ICD-10-CM

## 2023-11-06 MED ORDER — APIXABAN 5 MG PO TABS: 5.0000 mg | ORAL_TABLET | Freq: Two times a day (BID) | ORAL | 10 refills | Status: DC

## 2023-11-06 NOTE — Telephone Encounter (Signed)
 Eliquis  5mg  refill request received. Patient is 73 years old, weight-93.2kg, Crea-0.81 on 10/04/23, Diagnosis-Afib, and last seen by Suzann Riddle on 10/04/23. Dose is appropriate based on dosing criteria. Will send in refill to requested pharmacy.

## 2023-12-08 ENCOUNTER — Other Ambulatory Visit: Payer: Self-pay | Admitting: Medical Genetics

## 2023-12-08 DIAGNOSIS — Z006 Encounter for examination for normal comparison and control in clinical research program: Secondary | ICD-10-CM

## 2023-12-26 ENCOUNTER — Encounter: Admitting: Internal Medicine

## 2023-12-26 DIAGNOSIS — E7800 Pure hypercholesterolemia, unspecified: Secondary | ICD-10-CM

## 2023-12-26 DIAGNOSIS — M810 Age-related osteoporosis without current pathological fracture: Secondary | ICD-10-CM

## 2023-12-26 DIAGNOSIS — E039 Hypothyroidism, unspecified: Secondary | ICD-10-CM

## 2024-01-10 ENCOUNTER — Ambulatory Visit (INDEPENDENT_AMBULATORY_CARE_PROVIDER_SITE_OTHER): Admitting: Internal Medicine

## 2024-01-10 ENCOUNTER — Ambulatory Visit: Payer: Self-pay

## 2024-01-10 ENCOUNTER — Encounter: Payer: Self-pay | Admitting: Internal Medicine

## 2024-01-10 DIAGNOSIS — M810 Age-related osteoporosis without current pathological fracture: Secondary | ICD-10-CM

## 2024-01-10 DIAGNOSIS — I483 Typical atrial flutter: Secondary | ICD-10-CM

## 2024-01-10 DIAGNOSIS — E039 Hypothyroidism, unspecified: Secondary | ICD-10-CM

## 2024-01-10 DIAGNOSIS — Z0000 Encounter for general adult medical examination without abnormal findings: Secondary | ICD-10-CM

## 2024-01-10 DIAGNOSIS — E7800 Pure hypercholesterolemia, unspecified: Secondary | ICD-10-CM

## 2024-01-10 DIAGNOSIS — I471 Supraventricular tachycardia, unspecified: Secondary | ICD-10-CM

## 2024-01-10 DIAGNOSIS — R002 Palpitations: Secondary | ICD-10-CM

## 2024-01-10 LAB — GENECONNECT MOLECULAR SCREEN: Genetic Analysis Overall Interpretation: NEGATIVE

## 2024-01-10 NOTE — Telephone Encounter (Signed)
 Attempted to call [facility] to advise them of patient's symptoms and ER refusal---no answer at 7:57am  FYI Only or Action Required?: FYI only for provider: Patient refused ED at this time and states that this is her usual migraine.  Patient was last seen in primary care on 08/04/2022 by Glendia Shad, MD.  Called Nurse Triage reporting Headache.  Symptoms began last night.  Interventions attempted: OTC medications: tylenol.  Symptoms are: unchanged.  Triage Disposition: Go to ED Now (Notify PCP)  Patient/caregiver understands and will follow disposition?: No               Copied from CRM [id number]. Topic: Clinical - Red Word Triage >> Jan 10, 2024  7:40 AM Sydney Anderson wrote: Red Word that prompted transfer to Nurse Triage: Migraine, severe pain, had appointment but does not feel safe to drive. Reason for Disposition  Severe pain in one eye  Answer Assessment - Initial Assessment Questions Patient called to cancel her physical that was scheduled for today Appointment was rescheduled prior to being transferred to this RN Patient states she developed a migraine that started late last night and lasted all night with nausea Right eye--sharp stabbing pain--she states that this is usually how her migraines present Patient denies any falls, hitting her head, or known injuries Patient states she is on blood thinners and she has taken Tylenol She states that she has been diagnosed with migraines She doesn't feel comfortable driving due to the migraine and nausea Patient's husband doesn't drive either Patient is advised that with a headache with severe pain in one eye the recommendation is the ER for further evaluation She states this is her typical presentation but if it gets worse she will call 911 This RN advised her that I would send this to her PCP and she wanted to let her PCP Dr Shad Glendia know she was very sorry about having to cancel today. She is also advised that if  anything changes or she has any further questions/concerns she can call us  back at anytime. Patient verbalized understanding.  Protocols used: [facility]

## 2024-01-10 NOTE — Telephone Encounter (Signed)
 Reviewed. Agree with need for evaluation if increased headache.

## 2024-01-10 NOTE — Telephone Encounter (Signed)
 Lvm okay to relay. Please schedule pt with any available provider

## 2024-01-10 NOTE — Progress Notes (Signed)
 Patient ID: Sydney Anderson, female   DOB: January 24, 1951, 73 y.o.   MRN: [id number] Did not show for appt.

## 2024-01-10 NOTE — Assessment & Plan Note (Signed)
 Physical today 01/10/24.  Mammogram 06/09/2020 BI-RADS 1.  Overdue. Schedule. Colonoscopy July 2015.  Overdue. Was previously referred to GI.

## 2024-01-10 NOTE — Progress Notes (Deleted)
 Subjective:    Patient ID: Sydney Anderson, female    DOB: 17-Jun-1950, 73 y.o.   MRN: [id number]  Patient here for No chief complaint on file.   HPI Here for a physical exam. Saw cardiology 10/04/23 - f/u afib, aflutter, hypertension. She saw Dr. Cindie 11/2020 for palpitation evaluation. Had worn a zio that showed SVT burden. He started her on propafenone . Evaluation 07/2022, where she was having paroxysms of AFib with controlled ventricular rates. Using KardiaMobile to monitor. Visit - 10/04/23 - continue eliquis . Continue propafenone .   Needs bone density.    Past Medical History:  Diagnosis Date   Arthritis    Asthma    Atrial tachycardia    Depression    Diverticulitis    GERD (gastroesophageal reflux disease)    Glaucoma    Hypertension    No issues since quit smoking   Hypothyroidism    Kidney stones    Migraines    None for several years   Myopia of both eyes    Spinal stenosis    Past Surgical History:  Procedure Laterality Date   accident  2005   CATARACT EXTRACTION W/PHACO Left 06/27/2022   Procedure: CATARACT EXTRACTION PHACO AND INTRAOCULAR LENS PLACEMENT (IOC) LEFT 12.27 00:54.8;  Surgeon: Myrna Adine Anes, MD;  [facility];  Service: Ophthalmology;  Laterality: Left;   EYE SURGERY  2010,2011   FOOT SURGERY  2006   kidney stones  1980   SHOULDER ADHESION RELEASE  2008   Family History  Problem Relation Age of Onset   Arthritis Mother    Lung cancer Mother    Mental illness Mother    Colon cancer Father    Mental illness Father    Heart attack Brother    Stroke Maternal Aunt    Heart disease Maternal Aunt    Heart disease Maternal Grandfather        Pacemaker   Stroke Maternal Grandfather    Breast cancer Paternal Grandmother 89   Social History   Socioeconomic History   Marital status: Married    Spouse name: Not on file   Number of children: Not on file   Years of education: Not on file   Highest education level:  Master's degree (e.g., MA, MS, MEng, MEd, MSW, MBA)  Occupational History   Not on file  Tobacco Use   Smoking status: Former    Current packs/day: 0.00    Types: Cigarettes    Quit date: 2009    Years since quitting: 16.9   Smokeless tobacco: Never  Vaping Use   Vaping status: Never Used  Substance and Sexual Activity   Alcohol use: Yes    Alcohol/week: 15.0 standard drinks of alcohol    Types: 15 Standard drinks or equivalent per week    Comment: hard cider   Drug use: No   Sexual activity: Not on file  Other Topics Concern   Not on file  Social History Narrative   married   Social Drivers of Health   Financial Resource Strain: Medium Risk (01/09/2024)   Overall Financial Resource Strain (CARDIA)    Difficulty of Paying Living Expenses: Somewhat hard  Food Insecurity: No Food Insecurity (01/09/2024)   Hunger Vital Sign    Worried About Running Out of Food in the Last Year: Never true    Ran Out of Food in the Last Year: Never true  Transportation Needs: Unmet Transportation Needs (01/09/2024)   PRAPARE - Transportation    Lack  of Transportation (Medical): Yes    Lack of Transportation (Non-Medical): Yes  Physical Activity: Unknown (01/09/2024)   Exercise Vital Sign    Days of Exercise per Week: 3 days    Minutes of Exercise per Session: Not on file  Stress: Stress Concern Present (01/09/2024)   [employer] of Occupational Health - Occupational Stress Questionnaire    Feeling of Stress: Very much  Social Connections: Unknown (01/09/2024)   Social Connection and Isolation Panel    Frequency of Communication with Friends and Family: Patient declined    Frequency of Social Gatherings with Friends and Family: Patient declined    Attends Religious Services: Never    Database Administrator or Organizations: Yes    Attends Banker Meetings: 1 to 4 times per year    Marital Status: Married     Review of Systems     Objective:     There were no vitals  taken for this visit. Wt Readings from Last 3 Encounters:  10/04/23 205 lb 6.4 oz (93.2 kg)  08/07/23 200 lb (90.7 kg)  04/26/23 205 lb 9.6 oz (93.3 kg)    Physical Exam  {Perform Simple Foot Exam  Perform Detailed exam:1} {Insert foot Exam (Optional):30965}   Outpatient Encounter Medications as of 01/10/2024  Medication Sig   acetaminophen (TYLENOL) 500 MG tablet Take 500 mg by mouth every 6 (six) hours as needed.   apixaban  (ELIQUIS ) 5 MG TABS tablet Take 1 tablet (5 mg total) by mouth 2 (two) times daily.   cholecalciferol (VITAMIN D3) 25 MCG (1000 UNIT) tablet Take 1,000 Units by mouth daily.   FLUoxetine  (PROZAC ) 20 MG capsule TAKE 1 CAPSULE(20 MG) BY MOUTH DAILY   levothyroxine  (SYNTHROID ) 75 MCG tablet TAKE 1 TABLET(75 MCG) BY MOUTH DAILY   methylcellulose (ARTIFICIAL TEARS) 1 % ophthalmic solution Place 1 drop into both eyes as needed.   prednisoLONE acetate (PRED FORTE) 1 % ophthalmic suspension APPLY 1 DROP TO THE RIGHT EYE 4 TIMES A DAY STARTING 2 DAYS BEFORE LASER TREATMENT (Patient not taking: Reported on 08/07/2023)   propafenone  (RYTHMOL ) 225 MG tablet TAKE 1 TABLET BY MOUTH IN THE MORNING AND 1 IN THE EVENING   sodium chloride (OCEAN) 0.65 % nasal spray Place 1 spray into the nose as needed.   Timolol Maleate 0.5 % (DAILY) SOLN Place into the right eye daily.   VOLTAREN 1 % GEL Apply topically as needed.   [DISCONTINUED] Vitamin D , Ergocalciferol , (DRISDOL ) 1.25 MG (50000 UNIT) CAPS capsule TAKE 1 CAPSULE BY MOUTH EVERY 7 DAYS (Patient not taking: Reported on 08/07/2023)   No facility-administered encounter medications on file as of 01/10/2024.     Lab Results  Component Value Date   WBC 8.1 04/26/2023   HGB 15.7 04/26/2023   HCT 46.1 04/26/2023   PLT 252 04/26/2023   GLUCOSE 91 10/04/2023   CHOL 185 08/04/2022   TRIG 142.0 08/04/2022   HDL 45.30 08/04/2022   LDLCALC 111 (H) 08/04/2022   ALT 66 (H) 08/04/2022   AST 63 (H) 08/04/2022   NA 138 10/04/2023   K 4.5  10/04/2023   CL 102 10/04/2023   CREATININE 0.81 10/04/2023   BUN 10 10/04/2023   CO2 18 (L) 10/04/2023   TSH 4.012 07/07/2022   INR 1.0 11/07/2012    No results found.     Assessment & Plan:  Health care maintenance  Osteoporosis without current pathological fracture, unspecified osteoporosis type  Hypercholesteremia  Palpitations  SVT (supraventricular tachycardia)  Hypothyroidism, unspecified type     Allena Hamilton, MD

## 2024-01-11 NOTE — Telephone Encounter (Signed)
 Copied from CRM #[id number]. Topic: General - Other >> Jan 11, 2024  8:20 AM Pinkey ORN wrote: Reason for CRM: Attention Norine Skelton, CMA >> Jan 11, 2024  8:20 AM Pinkey ORN wrote: Patient is returning your call from yesterday, just wanting to let you know she's okay.

## 2024-02-04 ENCOUNTER — Other Ambulatory Visit: Payer: Self-pay | Admitting: Internal Medicine

## 2024-03-07 ENCOUNTER — Other Ambulatory Visit: Payer: Self-pay | Admitting: Internal Medicine

## 2024-03-07 DIAGNOSIS — I4891 Unspecified atrial fibrillation: Secondary | ICD-10-CM

## 2024-03-21 ENCOUNTER — Encounter: Admitting: Internal Medicine

## 2024-04-01 ENCOUNTER — Ambulatory Visit: Admitting: Cardiology

## 2024-04-05 ENCOUNTER — Encounter: Admitting: Internal Medicine

## 2024-08-12 ENCOUNTER — Ambulatory Visit
